# Patient Record
Sex: Female | Born: 1945
Health system: Southern US, Community
[De-identification: ages and names within clinical notes are randomized; demographics above are authoritative.]

## PROBLEM LIST (undated history)

## (undated) DIAGNOSIS — E785 Hyperlipidemia, unspecified: Secondary | ICD-10-CM

## (undated) DIAGNOSIS — M1990 Unspecified osteoarthritis, unspecified site: Secondary | ICD-10-CM

## (undated) DIAGNOSIS — H269 Unspecified cataract: Secondary | ICD-10-CM

## (undated) DIAGNOSIS — I10 Essential (primary) hypertension: Secondary | ICD-10-CM

## (undated) DIAGNOSIS — G459 Transient cerebral ischemic attack, unspecified: Secondary | ICD-10-CM

## (undated) DIAGNOSIS — I639 Cerebral infarction, unspecified: Secondary | ICD-10-CM

## (undated) DIAGNOSIS — K219 Gastro-esophageal reflux disease without esophagitis: Secondary | ICD-10-CM

## (undated) DIAGNOSIS — H409 Unspecified glaucoma: Secondary | ICD-10-CM

## (undated) HISTORY — PX: EYE SURGERY: SHX253

## (undated) HISTORY — DX: Essential (primary) hypertension: I10

## (undated) HISTORY — DX: Hyperlipidemia, unspecified: E78.5

## (undated) HISTORY — DX: Gastro-esophageal reflux disease without esophagitis: K21.9

## (undated) HISTORY — DX: Unspecified cataract: H26.9

## (undated) HISTORY — DX: Unspecified osteoarthritis, unspecified site: M19.90

## (undated) HISTORY — PX: KNEE SURGERY: SHX244

## (undated) HISTORY — DX: Cerebral infarction, unspecified: I63.9

## (undated) HISTORY — PX: TONSILLECTOMY: SUR1361

---

## 1998-02-19 ENCOUNTER — Other Ambulatory Visit: Admission: RE | Admit: 1998-02-19 | Discharge: 1998-02-19 | Payer: Self-pay | Admitting: Obstetrics and Gynecology

## 1998-03-14 ENCOUNTER — Other Ambulatory Visit: Admission: RE | Admit: 1998-03-14 | Discharge: 1998-03-14 | Payer: Self-pay | Admitting: Obstetrics and Gynecology

## 1998-05-09 ENCOUNTER — Other Ambulatory Visit: Admission: RE | Admit: 1998-05-09 | Discharge: 1998-05-09 | Payer: Self-pay | Admitting: Radiology

## 1998-09-02 ENCOUNTER — Other Ambulatory Visit: Admission: RE | Admit: 1998-09-02 | Discharge: 1998-09-02 | Payer: Self-pay | Admitting: Obstetrics and Gynecology

## 1999-03-13 ENCOUNTER — Other Ambulatory Visit: Admission: RE | Admit: 1999-03-13 | Discharge: 1999-03-13 | Payer: Self-pay | Admitting: Obstetrics and Gynecology

## 1999-05-23 ENCOUNTER — Encounter: Admission: RE | Admit: 1999-05-23 | Discharge: 1999-05-23 | Payer: Self-pay | Admitting: Emergency Medicine

## 1999-05-23 ENCOUNTER — Encounter: Payer: Self-pay | Admitting: Emergency Medicine

## 1999-06-12 ENCOUNTER — Encounter: Admission: RE | Admit: 1999-06-12 | Discharge: 1999-06-12 | Payer: Self-pay | Admitting: Emergency Medicine

## 1999-06-12 ENCOUNTER — Encounter: Payer: Self-pay | Admitting: Emergency Medicine

## 2000-03-15 ENCOUNTER — Other Ambulatory Visit: Admission: RE | Admit: 2000-03-15 | Discharge: 2000-03-15 | Payer: Self-pay | Admitting: Obstetrics and Gynecology

## 2000-08-30 ENCOUNTER — Ambulatory Visit (HOSPITAL_COMMUNITY): Admission: RE | Admit: 2000-08-30 | Discharge: 2000-08-30 | Payer: Self-pay | Admitting: Gastroenterology

## 2000-08-30 ENCOUNTER — Encounter: Payer: Self-pay | Admitting: Gastroenterology

## 2000-09-30 ENCOUNTER — Encounter: Payer: Self-pay | Admitting: General Surgery

## 2000-09-30 ENCOUNTER — Ambulatory Visit (HOSPITAL_COMMUNITY): Admission: RE | Admit: 2000-09-30 | Discharge: 2000-09-30 | Payer: Self-pay | Admitting: General Surgery

## 2000-10-18 ENCOUNTER — Encounter: Admission: RE | Admit: 2000-10-18 | Discharge: 2000-10-18 | Payer: Self-pay | Admitting: Emergency Medicine

## 2000-10-18 ENCOUNTER — Encounter: Payer: Self-pay | Admitting: Emergency Medicine

## 2001-03-21 ENCOUNTER — Other Ambulatory Visit: Admission: RE | Admit: 2001-03-21 | Discharge: 2001-03-21 | Payer: Self-pay | Admitting: Obstetrics and Gynecology

## 2001-03-29 ENCOUNTER — Encounter: Payer: Self-pay | Admitting: Emergency Medicine

## 2001-03-29 ENCOUNTER — Encounter: Admission: RE | Admit: 2001-03-29 | Discharge: 2001-03-29 | Payer: Self-pay | Admitting: Emergency Medicine

## 2002-05-25 ENCOUNTER — Other Ambulatory Visit: Admission: RE | Admit: 2002-05-25 | Discharge: 2002-05-25 | Payer: Self-pay | Admitting: Obstetrics & Gynecology

## 2003-04-09 ENCOUNTER — Encounter: Admission: RE | Admit: 2003-04-09 | Discharge: 2003-04-09 | Payer: Self-pay | Admitting: Gastroenterology

## 2003-06-08 ENCOUNTER — Other Ambulatory Visit: Admission: RE | Admit: 2003-06-08 | Discharge: 2003-06-08 | Payer: Self-pay | Admitting: Obstetrics & Gynecology

## 2003-07-02 ENCOUNTER — Encounter (INDEPENDENT_AMBULATORY_CARE_PROVIDER_SITE_OTHER): Payer: Self-pay | Admitting: *Deleted

## 2003-07-02 ENCOUNTER — Ambulatory Visit (HOSPITAL_COMMUNITY): Admission: RE | Admit: 2003-07-02 | Discharge: 2003-07-02 | Payer: Self-pay | Admitting: Gastroenterology

## 2003-07-30 ENCOUNTER — Encounter: Admission: RE | Admit: 2003-07-30 | Discharge: 2003-07-30 | Payer: Self-pay | Admitting: Gastroenterology

## 2004-01-11 ENCOUNTER — Encounter: Admission: RE | Admit: 2004-01-11 | Discharge: 2004-01-11 | Payer: Self-pay | Admitting: Emergency Medicine

## 2004-01-23 ENCOUNTER — Encounter: Admission: RE | Admit: 2004-01-23 | Discharge: 2004-01-23 | Payer: Self-pay | Admitting: Emergency Medicine

## 2004-05-29 ENCOUNTER — Encounter: Admission: RE | Admit: 2004-05-29 | Discharge: 2004-05-29 | Payer: Self-pay | Admitting: Obstetrics and Gynecology

## 2004-06-10 ENCOUNTER — Encounter (INDEPENDENT_AMBULATORY_CARE_PROVIDER_SITE_OTHER): Payer: Self-pay | Admitting: *Deleted

## 2004-06-10 ENCOUNTER — Ambulatory Visit (HOSPITAL_BASED_OUTPATIENT_CLINIC_OR_DEPARTMENT_OTHER): Admission: RE | Admit: 2004-06-10 | Discharge: 2004-06-10 | Payer: Self-pay | Admitting: General Surgery

## 2004-06-10 ENCOUNTER — Ambulatory Visit (HOSPITAL_COMMUNITY): Admission: RE | Admit: 2004-06-10 | Discharge: 2004-06-10 | Payer: Self-pay | Admitting: General Surgery

## 2004-06-30 ENCOUNTER — Ambulatory Visit (HOSPITAL_COMMUNITY): Admission: RE | Admit: 2004-06-30 | Discharge: 2004-06-30 | Payer: Self-pay | Admitting: Obstetrics and Gynecology

## 2004-07-01 ENCOUNTER — Encounter: Admission: RE | Admit: 2004-07-01 | Discharge: 2004-07-01 | Payer: Self-pay | Admitting: Emergency Medicine

## 2004-10-22 ENCOUNTER — Ambulatory Visit (HOSPITAL_COMMUNITY): Admission: RE | Admit: 2004-10-22 | Discharge: 2004-10-22 | Payer: Self-pay | Admitting: Obstetrics and Gynecology

## 2004-10-22 ENCOUNTER — Encounter (INDEPENDENT_AMBULATORY_CARE_PROVIDER_SITE_OTHER): Payer: Self-pay | Admitting: *Deleted

## 2005-04-19 ENCOUNTER — Emergency Department (HOSPITAL_COMMUNITY): Admission: EM | Admit: 2005-04-19 | Discharge: 2005-04-19 | Payer: Self-pay | Admitting: Emergency Medicine

## 2007-04-25 ENCOUNTER — Inpatient Hospital Stay (HOSPITAL_COMMUNITY): Admission: RE | Admit: 2007-04-25 | Discharge: 2007-04-27 | Payer: Self-pay | Admitting: Orthopedic Surgery

## 2010-09-09 NOTE — Op Note (Signed)
[name], [name]               ACCOUNT NO.:  [id number]   MEDICAL RECORD NO.:  [id number]          PATIENT TYPE:  INP   LOCATION:  2899                         FACILITY:  [facility]   PHYSICIAN:  Elana Alm. Thurston Hole, M.D. DATE OF BIRTH:  [birth year]   DATE OF PROCEDURE:  04/25/2007  DATE OF DISCHARGE:                               OPERATIVE REPORT   PREOPERATIVE DIAGNOSES:  Right knee degenerative joint disease with  avascular necrosis medial femoral condyle.   POSTOPERATIVE DIAGNOSES:  Right knee degenerative joint disease with  avascular necrosis medial femoral condyle.   PROCEDURE:  Right total knee replacement using DePuy cemented total knee  system with number 2.5 cemented femur, #3 cemented tibia with 12.5 mm  polyethylene RP tibial spacer and 32 mm polyethylene cemented patella.   SURGEON:  Dr. Salvatore Marvel.   ASSISTANT:  Kirstin Shepperson, PA.   ANESTHESIA:  General.   OPERATIVE TIME:  1 hour 30 minutes.   COMPLICATIONS:  None.   DESCRIPTION OF PROCEDURE:  Catherine [name] was brought to operating room on  04/25/2007 after a femoral nerve block was placed by anesthesia.  She  was placed on operative table in supine position.  She received Ancef 1  gram IV preoperatively for prophylaxis.  After being placed under  general anesthesia she had a Foley catheter placed under sterile  conditions.  Her right knee was examined.  Range of motion from minus 10  to 120 degrees with mild varus deformity and knee stable ligamentous  exam with normal patellar tracking.  The right leg was prepped using  sterile DuraPrep and draped using sterile technique.  Originally leg was  exsanguinated and a thigh tourniquet elevated 365 mm.  Initially through  a 15 cm longitudinal incision based over the patella, initial exposure  was made.  The underlying subcutaneous tissues were incised along with  skin incision.  A median arthrotomy was performed revealing an excessive  amount of normal-appearing  joint fluid.  The articular surfaces were  inspected.  She had complete grade 4 changes on the medial femoral  condyle, medial tibial plateau, grade 3 changes laterally and grade 3  and 4 changes in the patellofemoral joint.  Osteophytes removed from the  femoral condyles and tibial plateau.  The medial and lateral meniscal  remnants removed as well as anterior cruciate ligament.  An  intramedullary drill was then drilled up the femoral canal for placement  distal femoral cutting jig, which was placed in the appropriate manner  rotation and the distal 13 mm cut was made.  Distal femur was incised.  A #2.5 was found be the appropriate size and 2.5 cutting jig was placed  in appropriate manner of external rotation and then these cuts were  made.  The proximal tibia was then exposed.  Tibial spines were removed  with an oscillating saw.  Intramedullary drill was drilled down the  tibial canal for placement of proximal tibial cutting jig, which was  placed in the appropriate manner rotation and proximal 4 mm cut was made  based off the medial or lower side.  Spacer blocks were  then placed in  flexion and extension.  A 12.5 mm blocks were used and this gave  excellent stability, excellent balancing and excellent correction of her  flexion and varus deformities.  At this point the #3 tibial base plate  trial was placed on the cut tibial surface with an excellent fit and a  keel cut was made.  The PCL box cutter was then placed on the distal  femur and then these cuts were made.  At this point the #2.5 femoral  trial was placed and with the #3 tibial base plate trial and a 12.5 mm  polyethylene tibial spacer, knee was reduced, taken through range of  motion from zero to 125 degrees with excellent stability and excellent  correction of her flexion and varus deformities.  A resurfacing 8 mm cut  was then made on the patella and three locking holes placed for a 32 mm  patellar trial which was  placed.  Patellofemoral tracking was then  evaluated and found to be normal.  At this point it was felt that all  the components were of excellent size, fit and stability.  They were  then removed and the knee was then jet lavage irrigated with 3 liters of  saline.  Proximal tibia was then exposed.  A #3 tibial baseplate with  cement backing was hammered into position with an excellent fit with  excess cement being removed from around the edges.  The #2.5 femoral  component with cement backing was hammered into position also with an  excellent fit with excess cement being removed from around the edges.  The 12.5 mm polyethylene RP tibial spacer was placed on the tibial  baseplate.  The knee reduced, taken through range of motion from zero to  125 degrees with excellent stability and excellent correction of her  flexion and varus deformities.  The 32 mm polyethylene cement backed  patella was then placed in this position and held there with a clamp.  After the cement hardened again patellofemoral tracking was evaluated  and found to be normal.  At this point it was felt that all components  were of excellent size, fit and stability.  The knee was further  irrigated with saline.  The tourniquet was then released.  Hemostasis  obtained with cautery.  The arthrotomy was then closed with #1 Ethilon  suture over two medium Hemovac drains.  Subcutaneous tissues closed with  zero and 2-0 Vicryl.  Subcuticular layer closed with 4-0 Monocryl.  Sterile dressings long-leg splint applied.  The patient then awakened,  extubated, taken to recovery in stable condition.  Needle and sponge  counts correct x2 at the end of the case.  Pulses 2+ and symmetric.      Robert A. Thurston Hole, M.D.  Electronically Signed     RAW/MEDQ  D:  04/25/2007  T:  04/25/2007  Job:  [id number]

## 2010-09-12 NOTE — Op Note (Signed)
NAME:  Catherine [name], Catherine [name]               ACCOUNT NO.:  [id number]   MEDICAL RECORD NO.:  [id number]          PATIENT TYPE:  AMB   LOCATION:  SDC                           FAC[facility]  [facility]   PHYSICIAN:  Sherry A. Dickstein, M.D.DATE OF BIRTH:  05/13/1945   DATE OF PROCEDURE:  10/22/2004  DATE OF DISCHARGE:                                 OPERATIVE REPORT   PREOPERATIVE DIAGNOSIS:  Left adnexal mass.   POSTOPERATIVE DIAGNOSIS:  Left ovarian mass.   PROCEDURE:  Diagnostic laparoscopy with left salpingo-oophorectomy.   SURGEONS:  1.  Sherry A. Rosalio Macadamia, M.D.  2.  Lenoard Aden, M.D.   ANESTHESIA:  General.   INDICATIONS:  This is a 65 year old G3 P3-0-0-3 woman who has had some  intermittent left lower quadrant pain for the past six to eight months.  The  patient was initially evaluated and had a CT scan which revealed a possible  left adnexal mass.  Ultrasound was performed which showed fibroid present on  the uterus plus an adnexal mass that was solid.  It could not be determined  whether this was part of her ovary or another fibroid.  Because of this, the  patient was prepared for a diagnostic laparoscopy.  However, preoperatively  the patient had a change in her EKG that was thought to be a possible MI.  The patient was then worked up completely for an MI which was found to be  negative.  She then was brought back for preoperative workup and now is  ready for surgery.   FINDINGS:  Normal anteflexed uterus, with a  2-3 cm pedunculated fibroid off  the posterior wall of her uterus.  Her left ovary with a 4-5 cm solid mass,  consistent with a fibroma.  Normal tube on the left.  Normal right tube and  ovary, and normal upper abdominal exploration.   PROCEDURE:  The patient was brought into the operating room and given  adequate general anesthesia.  She was placed in a dorsal lithotomy position.  Her abdomen and then her vagina were washed with Betadine.  A Foley catheter  was  placed in the bladder.  Speculum was placed in the vagina. Cervix was  grasped with a single-tooth tenaculum.  Cervix was sounded.  Acorn tenaculum  was placed in the cervix.  Surgeon's gown and gloves were changed.  The  patient was draped in a sterile fashion.  Subumbilical incision was  infiltrated with 0.25% Marcaine and incision made.  The fascia was  identified.  It was grasped.  It was incised.  The fascial edges were  identified, and a pursestring stitch was taken with 0 Vicryl.  The  peritoneum was identified and entered bluntly.  A Hasson trocar was  introduced into the peritoneal cavity.  Carbon dioxide was insufflated.  The  laparoscope was introduced into the peritoneal cavity.  A left lateral  incision was made after infiltrating with 0.25% Marcaine, and a left 5 mm  trocar was introduced under direct visualization.  The pelvis was inspected,  with the above findings, and once the left ovary was seen it  was determined  that this was going to be removed.  A right lateral midline incision was  made after infiltrating with 0.25% Marcaine, and a right 5 mm trocar was  introduced under direct visualization.  The left infundibulopelvic ligament  and left uteroovarian ligaments were cauterized with tripolar cautery and  cut and excised in this fashion.  This was well above the ureters.  It was  grasped, and then it was placed in an EndoCatch bag.  A 5 mm scope was  placed so that the specimen could be placed in the EndoCatch bag through the  umbilical port.  The specimen was then removed through the umbilical port  through the bag by cutting it into pieces.  It was very firm and very hard.  It was consistent with a fibroma, and this was removed in piece until the  entire specimen and bag could be removed.  Once this was removed, the Hasson  was replaced into the abdominal cavity, and carbon dioxide was re-  insufflated.  The pelvis was inspected.  The pelvis was washed.  Pictures   were obtained.  Adequate hemostasis was present.  Once all fluid was removed  because of previous irrigation, the pelvis was washed with 0.25% Marcaine to  leave in for postoperative pain relief.  All carbon dioxide was allowed to  escape.  The 5 mm ports were removed under direct visualization.  The Hasson  was removed after all carbon dioxide had escaped.  The Vicryl stitch was  closed over a finger to prevent any bowel being caught during closure.  The  subumbilical incision was closed with 4-0 Monocryl in a running stitch.  All  incisions were then closed with Dermabond.  The tenaculum and acorn were  removed from the vagina.  The patient was taken out of the dorsal lithotomy  position.  She was awakened.  She was extubated.  She was moved from the  operating table to a stretcher in stable condition.   COMPLICATIONS:  None.   ESTIMATED BLOOD LOSS:  Less than 5 cc.   SPECIMENS:  Left tube and ovary.       SAD/MEDQ  D:  10/22/2004  T:  10/22/2004  Job:  [id number]

## 2010-09-12 NOTE — Op Note (Signed)
NAME:  Catherine [name], Catherine [name]                         ACCOUNT NO.:  [id number]   MEDICAL RECORD NO.:  [id number]                   PATIENT TYPE:  AMB   LOCATION:  ENDO                                 FACILITY:  [facility]   PHYSICIAN:  Anselmo Rod, M.D.               DATE OF BIRTH:  1945/10/05   DATE OF PROCEDURE:  07/02/2003  DATE OF DISCHARGE:                                 OPERATIVE REPORT   PROCEDURE:  Colonoscopy with snare polypectomy x1.   ENDOSCOPIST:  Anselmo Rod, M.D.   INSTRUMENT USED:  Olympus video colonoscope.   INDICATIONS FOR PROCEDURE:  A 65 year old white female underwent a screening  colonoscopy.  The patient has had occasional rare BPR and some right lower  quadrant pain.  Will rule out colonic polyps, masses, etc.   PREPROCEDURE PREPARATION:  Informed consent was procured from the patient.  The patient was fasted for eight hours prior to the procedure and prepped  with a bottle of magnesium citrate and a gallon of GoLYTELY the night prior  to the procedure.   PREPROCEDURE PHYSICAL EXAMINATION:  VITAL SIGNS:  Stable.  NECK:  Supple.  CHEST:  Clear to auscultation.  S1 and S2 regular.  ABDOMEN:  Soft with normal bowel sounds.   DESCRIPTION OF PROCEDURE:  The patient was placed in the left lateral  decubitus position and sedated with 75 mg of Demerol and 5 mg of Versed  intravenously.  Once the patient was adequately sedated and maintained on  low flow oxygen and continuous cardiac monitoring, the Olympus video  colonoscope was advanced from the rectum to the cecum.  There was severe  melanosis coli throughout the colon.  The appendiceal orifice and ileocecal  valve were visualized and photographed. The patient's position was changed  from the left lateral to the supine position to reach the cecal base.  The  terminal ileum appeared healthy and without lesions.  A small sessile polyp  was removed (by cold snare).  Small internal hemorrhoids were recognized on  retroflexion in the rectum.  There was no evidence of diverticulosis.  No  other masses or polyps were seen.  The patient tolerated the procedure well  without complications.   IMPRESSION:  1. Small nonbleeding internal hemorrhoids.  2. Severe melanosis coli throughout the colon.  3. Small polyp snared at 10 cm (cold snare).  4. Normal terminal ileum.   RECOMMENDATIONS:  1. Await pathology results.  2. Avoid laxatives, but use stool softeners as needed.  3. High fiber diet with liberal fluid intake.  4. Outpatient follow-up in the next two weeks for further recommendations.                                               Anselmo Rod, M.D.  JNM/MEDQ  D:  07/02/2003  T:  07/02/2003  Job:  [id number]   cc:   Reuben Likes, M.D.  [address]  [place]  [place] [address]  Fax: [phone]   Hayes Ludwig, N.P. [facility]

## 2010-09-12 NOTE — Op Note (Signed)
Catherine [name], HABIG               ACCOUNT NO.:  [id number]   MEDICAL RECORD NO.:  [id number]          PATIENT TYPE:  AMB   LOCATION:  SDC                           [facility]ILITY[facility]   PHYSICIAN:  Adolph Pollack, M.D.DATE OF BIRTH:  1946-04-07   DATE OF PROCEDURE:  06/10/2004  DATE OF DISCHARGE:                                 OPERATIVE REPORT   PREOPERATIVE DIAGNOSIS:  A 1 cm left arm soft tissue mass.   POSTOPERATIVE DIAGNOSIS:  A 1 cm left arm soft tissue mass.   OPERATION PERFORMED:  Excision of 1 cm left arm soft tissue mass.   SURGEON:  Adolph Pollack, M.D.   ANESTHESIA:  Lidocaine Marcaine mix.   INDICATIONS FOR PROCEDURE:  This is a 65 year old female who noticed a soft  tissue mass proximal to the medial epicondyle, left upper extremity becoming  more painful.  She now presents for excision.   DESCRIPTION OF PROCEDURE:  The patient was placed supine on the operating  table and the arm was positioned to expose the mass.  The mass was marked  with a marking pen.  The area was sterilely prepped and draped.  Local  anesthetic was infiltrated directly over the mass.  A longitudinal incision  was made over through the skin and subcutaneous tissue over the mass.  Using  blunt dissection, I was able to dissect the mass down.  It looked like it  could be lipomatous or possibly even an epitrochlear lymph node.  This  measured 1 cm and was sent to pathology.   Direct pressure was held on the wound and hemostasis was adequate.  The skin  was then closed with running 4-0 Monocryl subcuticular stitch followed by  Steri-Strips and sterile dressing.  The patient tolerated the procedure  without any apparent complications.  She will be released from the minor  procedure room with instructions and follow up with me in two weeks.      TJR/MEDQ  D:  06/10/2004  T:  06/10/2004  Job:  [id number]

## 2010-12-30 ENCOUNTER — Other Ambulatory Visit (HOSPITAL_COMMUNITY): Payer: Self-pay | Admitting: Gastroenterology

## 2010-12-30 DIAGNOSIS — R110 Nausea: Secondary | ICD-10-CM

## 2010-12-30 DIAGNOSIS — R1084 Generalized abdominal pain: Secondary | ICD-10-CM

## 2011-01-09 ENCOUNTER — Ambulatory Visit (HOSPITAL_COMMUNITY)
Admission: RE | Admit: 2011-01-09 | Discharge: 2011-01-09 | Disposition: A | Discharge status: Home / Self Care | Payer: BC Managed Care – PPO | Source: Ambulatory Visit | Attending: Gastroenterology | Admitting: Gastroenterology

## 2011-01-09 DIAGNOSIS — R110 Nausea: Secondary | ICD-10-CM | POA: Insufficient documentation

## 2011-01-09 DIAGNOSIS — R109 Unspecified abdominal pain: Secondary | ICD-10-CM | POA: Insufficient documentation

## 2011-01-09 DIAGNOSIS — R1084 Generalized abdominal pain: Secondary | ICD-10-CM

## 2011-01-09 MED ORDER — TECHNETIUM TC 99M MEBROFENIN IV KIT
5.0000 | PACK | Freq: Once | INTRAVENOUS | Status: AC | PRN
  Administered 2011-01-09: 5 via INTRAVENOUS

## 2011-01-30 LAB — CBC
HCT: 29.1 — ABNORMAL LOW
HCT: 38.4
Hemoglobin: 10 — ABNORMAL LOW
MCHC: 34
MCV: 87.4
MCV: 87.5
Platelets: 227
Platelets: 242
Platelets: 375
RBC: 3.21 — ABNORMAL LOW
RBC: 3.35 — ABNORMAL LOW
RDW: 13.6
WBC: 6.3
WBC: 7.4

## 2011-01-30 LAB — BASIC METABOLIC PANEL
CO2: 29
CO2: 30
Calcium: 7.9 — ABNORMAL LOW
Creatinine, Ser: 0.6
GFR calc Af Amer: >60
GFR calc non Af Amer: >60
GFR calc non Af Amer: >60
Potassium: 3.7
Sodium: 135

## 2011-01-30 LAB — DIFFERENTIAL
Basophils Relative: 0
Eosinophils Relative: 1
Lymphocytes Relative: 31
Lymphs Abs: 1.9
Monocytes Relative: 8
Neutrophils Relative %: 60

## 2011-01-30 LAB — COMPREHENSIVE METABOLIC PANEL
Albumin: 4
Alkaline Phosphatase: 84
CO2: 31
Calcium: 9.7
Chloride: 101
Creatinine, Ser: 0.72
GFR calc Af Amer: >60
Potassium: 4.1

## 2011-01-30 LAB — URINE CULTURE: Culture: NO GROWTH

## 2011-01-30 LAB — URINALYSIS, ROUTINE W REFLEX MICROSCOPIC
Bilirubin Urine: NEGATIVE
Ketones, ur: NEGATIVE
Urobilinogen, UA: 0.2

## 2011-01-30 LAB — PROTIME-INR
INR: 1.2
INR: 3 — ABNORMAL HIGH
INR: 3.5 — ABNORMAL HIGH
Prothrombin Time: 15
Prothrombin Time: 32.3 — ABNORMAL HIGH
Prothrombin Time: 36.7 — ABNORMAL HIGH

## 2011-01-30 LAB — APTT: aPTT: 32

## 2011-01-30 LAB — TYPE AND SCREEN: Antibody Screen: NEGATIVE

## 2012-08-16 ENCOUNTER — Ambulatory Visit (INDEPENDENT_AMBULATORY_CARE_PROVIDER_SITE_OTHER): Payer: BC Managed Care – PPO | Admitting: Family Medicine

## 2012-08-16 ENCOUNTER — Ambulatory Visit (HOSPITAL_COMMUNITY)
Admission: RE | Admit: 2012-08-16 | Discharge: 2012-08-16 | Disposition: A | Discharge status: Home / Self Care | Payer: BC Managed Care – PPO | Source: Ambulatory Visit | Attending: Family Medicine | Admitting: Family Medicine

## 2012-08-16 VITALS — BP 120/76 | HR 69 | Temp 98.0°F | Resp 16 | Ht 63.0 in | Wt 173.4 lb

## 2012-08-16 DIAGNOSIS — R112 Nausea with vomiting, unspecified: Secondary | ICD-10-CM

## 2012-08-16 DIAGNOSIS — R270 Ataxia, unspecified: Secondary | ICD-10-CM

## 2012-08-16 DIAGNOSIS — R5381 Other malaise: Secondary | ICD-10-CM | POA: Insufficient documentation

## 2012-08-16 DIAGNOSIS — R5383 Other fatigue: Secondary | ICD-10-CM | POA: Insufficient documentation

## 2012-08-16 DIAGNOSIS — R51 Headache: Secondary | ICD-10-CM | POA: Insufficient documentation

## 2012-08-16 DIAGNOSIS — R279 Unspecified lack of coordination: Secondary | ICD-10-CM

## 2012-08-16 DIAGNOSIS — R531 Weakness: Secondary | ICD-10-CM

## 2012-08-16 LAB — POCT UA - MICROSCOPIC ONLY
Bacteria, U Microscopic: NEGATIVE
Crystals, Ur, HPF, POC: NEGATIVE

## 2012-08-16 LAB — COMPREHENSIVE METABOLIC PANEL
ALT: 13 U/L (ref 0–35)
CO2: 30 mEq/L (ref 19–32)
Calcium: 10.2 mg/dL (ref 8.4–10.5)
Chloride: 98 mEq/L (ref 96–112)
Creat: 0.58 mg/dL (ref 0.50–1.10)
Glucose, Bld: 101 mg/dL — ABNORMAL HIGH (ref 70–99)
Sodium: 137 mEq/L (ref 135–145)
Total Bilirubin: 0.8 mg/dL (ref 0.3–1.2)
Total Protein: 7.6 g/dL (ref 6.0–8.3)

## 2012-08-16 LAB — POCT URINALYSIS DIPSTICK
Bilirubin, UA: NEGATIVE
Ketones, UA: NEGATIVE
Protein, UA: NEGATIVE
Spec Grav, UA: 1.015
pH, UA: 7

## 2012-08-16 LAB — VITAMIN B12: Vitamin B-12: 528 pg/mL (ref 211–911)

## 2012-08-16 LAB — POCT CBC
Granulocyte percent: 84.9 %G — AB (ref 37–80)
HCT, POC: 41.3 % (ref 37.7–47.9)
Hemoglobin: 13 g/dL (ref 12.2–16.2)
MCH, POC: 28.3 pg (ref 27–31.2)
MCV: 90 fL (ref 80–97)
RBC: 4.59 M/uL (ref 4.04–5.48)
WBC: 8.9 10*3/uL (ref 4.6–10.2)

## 2012-08-16 LAB — TSH: TSH: 0.993 u[IU]/mL (ref 0.350–4.500)

## 2012-08-16 MED ORDER — PROMETHAZINE HCL 12.5 MG PO TABS
25.0000 mg | ORAL_TABLET | Freq: Once | ORAL | Status: AC
  Administered 2012-08-16: 25 mg via ORAL

## 2012-08-16 MED ORDER — PROMETHAZINE HCL 25 MG PO TABS
25.0000 mg | ORAL_TABLET | Freq: Three times a day (TID) | ORAL | Status: DC | PRN
Start: 2012-08-16 — End: 2013-02-15

## 2012-08-16 MED ORDER — BUTALBITAL-APAP-CAFF-COD 50-325-40-30 MG PO CAPS: 1.0000 | ORAL_CAPSULE | ORAL | Status: DC | PRN

## 2012-08-16 NOTE — Progress Notes (Signed)
Subjective:    [name], female    DOB: 1946-03-15, 67 y.o.   MRN: [id number] Chief Complaint  Patient presents with  . Headache    started this morning   HPI  Catherine [name] is a 67 yo woman with no sig PMHx who was in her normal state of health this morning.  She was cleaning up the kitchen when she had very sudden onset of sharp stabbing pains starting in bilateral posterior neck/occiput and radiating around head to bilateral temples when she suddenly strained up from bending over.  The sharp stabbing pain only lasted for a few seconds but since then she has continued to have a severe bitemporal HA, and feels very weak, disoriented, and nauseated. About 2 hrs after heaving the headache she was still feeing like she was going to pass out and did vomit due to headache so she called her husband at work to come home and get her.  Her husband reports that this is very unusual but that her voice sounds more weak and faint - but not slurred or trouble word-finding.  She still feels "shakey all over" but has been completely coherent and alert the entire time, but just not like herself.  Currently has had the headache for around 8 hrs and not improving or resolving at all. Feels imbalanced and unsteady when walking - like she might fall.  Has not vomitied in 4 hrs now.  HA is worse w/ light and sound. No h/o migraines.  No sinus congestion or allergies.  Took a generic excedrin migraine this morning w/o relief. Never had anything like this prior. All sxs have been throughout whole body - nothing is one sided.  She is not on any medications. Does take a daily mvi and just started a b12 supp yest as she has been feeling much more weak and fatigued over the past sev d.   Past Medical History  Diagnosis Date  . Arthritis   . GERD (gastroesophageal reflux disease)   . Cataract    Review of Systems  Constitutional: Positive for activity change, appetite change and fatigue. Negative for fever,  chills, diaphoresis and unexpected weight change.  HENT: Negative for ear pain, congestion, rhinorrhea, neck pain, neck stiffness, dental problem, sinus pressure and tinnitus.   Eyes: Positive for photophobia. Negative for pain, discharge and visual disturbance.  Respiratory: Negative for cough and shortness of breath.   Cardiovascular: Negative for chest pain and palpitations.  Gastrointestinal: Positive for nausea and vomiting. Negative for abdominal pain.  Skin: Negative for rash.  Neurological: Positive for weakness, light-headedness and headaches. Negative for dizziness, tremors, seizures, syncope, facial asymmetry, speech difficulty and numbness.  Psychiatric/Behavioral: Negative for sleep disturbance.      BP 120/76  Pulse 69  Temp(Src) 98 F (36.7 C) (Oral)  Resp 16  Ht 5\' 3"  (1.6 m)  Wt 173 lb 6.4 oz (78.654 kg)  BMI 30.72 kg/m2  SpO2 98% Objective:   Physical Exam  Constitutional: She is oriented to person, place, and time. She appears well-developed and well-nourished. She appears lethargic.  Laying on exam table in dark room, all lights off.  HENT:  Head: Normocephalic and atraumatic.  Right Ear: External ear and ear canal normal. Tympanic membrane is injected. A middle ear effusion is present.  Left Ear: External ear and ear canal normal. Tympanic membrane is injected. A middle ear effusion is present.  Nose: Nose normal. No mucosal edema or rhinorrhea. Right sinus exhibits no maxillary  sinus tenderness. Left sinus exhibits no maxillary sinus tenderness.  Mouth/Throat: Uvula is midline, oropharynx is clear and moist and mucous membranes are normal. No oropharyngeal exudate.  Normal fundoscopic exam  Eyes: Conjunctivae, EOM and lids are normal. Pupils are equal, round, and reactive to light.  Neck: Trachea normal. Neck supple. Muscular tenderness present. No spinous process tenderness present. No rigidity. Decreased range of motion present. No edema and no erythema  present. No mass and no thyromegaly present.  Cardiovascular: Normal rate, regular rhythm, normal heart sounds and intact distal pulses.   Pulmonary/Chest: Effort normal and breath sounds normal. No respiratory distress.  Neurological: She is oriented to person, place, and time. She has normal strength. She appears lethargic. She is not disoriented. She displays no tremor. No cranial nerve deficit or sensory deficit. She exhibits normal muscle tone. She displays a negative Romberg sign. Coordination and gait abnormal.  Reflex Scores:      Bicep reflexes are 2+ on the right side and 2+ on the left side.      Brachioradialis reflexes are 2+ on the right side and 2+ on the left side. Slight delay and marked confusion bilaterally with finger-to-nose and rapid alternating movements. Gait slowed and unsteady.  Skin: Skin is warm and dry. She is not diaphoretic.  Psychiatric: She has a normal mood and affect. Her behavior is normal.      [facility] reading (PRIMARY) by  Dr. Clelia Croft. EKG: NSR, no ischemic changes  Results for orders placed in visit on 08/16/12  POCT CBC      Result Value Range   WBC 8.9  4.6 - 10.2 K/uL   Lymph, poc 0.9  0.6 - 3.4   POC LYMPH PERCENT 10.6  10 - 50 %L   MID (cbc) 0.4  0 - 0.9   POC MID % 4.5  0 - 12 %M   POC Granulocyte 7.6 (*) 2 - 6.9   Granulocyte percent 84.9 (*) 37 - 80 %G   RBC 4.59  4.04 - 5.48 M/uL   Hemoglobin 13.0  12.2 - 16.2 g/dL   HCT, POC 16.1  09.6 - 47.9 %   MCV 90.0  80 - 97 fL   MCH, POC 28.3  27 - 31.2 pg   MCHC 31.5 (*) 31.8 - 35.4 g/dL   RDW, POC 04.5     Platelet Count, POC 344  142 - 424 K/uL   MPV 8.9  0 - 99.8 fL  POCT UA - MICROSCOPIC ONLY      Result Value Range   WBC, Ur, HPF, POC 0-1     RBC, urine, microscopic 0-4     Bacteria, U Microscopic neg     Mucus, UA neg     Epithelial cells, urine per micros 0-3     Crystals, Ur, HPF, POC neg     Casts, Ur, LPF, POC neg     Yeast, UA neg    POCT URINALYSIS DIPSTICK      Result Value  Range   Color, UA yellow     Clarity, UA clear     Glucose, UA neg     Bilirubin, UA neg     Ketones, UA neg     Spec Grav, UA 1.015     Blood, UA trace-intact     pH, UA 7.0     Protein, UA neg     Urobilinogen, UA 0.2     Nitrite, UA neg     Leukocytes, UA Trace  Assessment & Plan:  New onset headache - concern for thunderclap headache - "worst headache of my life" in 67 yo woman w/ no prior headache history - Check labs - send out crp, tsh, cmp, b12, esr - and send pt to [facility] for stat non-contrast head CT now.  Given phenergan 25mg  po x 1 in office to help w/ nausea and headache, start pushing fluids.  Called w/ CT results - normal.  Will treat for migraine headache w/ fioricet and promethazine. F/u in clinic tomorrow for further eval unless completely headache free. hsuband's cell name Catherine [name] - [phone] is his cell

## 2012-09-14 ENCOUNTER — Ambulatory Visit (INDEPENDENT_AMBULATORY_CARE_PROVIDER_SITE_OTHER): Payer: BC Managed Care – PPO | Admitting: Family Medicine

## 2012-09-14 VITALS — BP 137/76 | HR 69 | Temp 98.3°F | Resp 18 | Ht 63.0 in | Wt 169.0 lb

## 2012-09-14 DIAGNOSIS — T148XXA Other injury of unspecified body region, initial encounter: Secondary | ICD-10-CM

## 2012-09-14 MED ORDER — AMOXICILLIN-POT CLAVULANATE 875-125 MG PO TABS: 1.0000 | ORAL_TABLET | Freq: Two times a day (BID) | ORAL | Status: DC

## 2012-09-14 MED ORDER — CEFTRIAXONE SODIUM 1 G IJ SOLR
1.0000 g | Freq: Once | INTRAMUSCULAR | Status: AC
  Administered 2012-09-14: 1 g via INTRAMUSCULAR

## 2012-09-14 NOTE — Patient Instructions (Signed)
Very nice to meet you Put that goat in time out.  The glue should hold for 3 days. It will dissolve on its own.  I am also giving you an antibiotic.  Take daily for 10 days.  Come back if any redness, fever, chills, or discharge comes from area.

## 2012-09-14 NOTE — Progress Notes (Signed)
Patient is a very pleasant 67 year old female who is unfortunately headbutting by a goat in the lower right extremity today approximately 3 hours ago. This goat has been a pet for the last year. All immunizations are up-to-date. Patient did have a hole in the leg that did bleed quite considerably for some time.  Patient able to walk, no sign of infection, been icing it with some improvement.  No radiation of pain, no numbness.   Past Medical History  Diagnosis Date  . Arthritis   . GERD (gastroesophageal reflux disease)   . Cataract    Past Surgical History  Procedure Laterality Date  . Eye surgery     History   Social History  . Marital Status: Married    Spouse Name: N/A    Number of Children: N/A  . Years of Education: N/A   Social History Main Topics  . Smoking status: Never Smoker   . Smokeless tobacco: None  . Alcohol Use: None  . Drug Use: None  . Sexually Active: None   Other Topics Concern  . None   Social History Narrative  . None   Family History  Problem Relation Age of Onset  . Dementia Mother   . Cancer Mother   . Cancer Father     Physical exam Blood pressure 137/76, pulse 69, temperature 98.3 F (36.8 C), temperature source Oral, resp. rate 18, height 5\' 3"  (1.6 m), weight 169 lb (76.658 kg), SpO2 99.00%. General: No apparent distress alert and oriented x3 mood and affect normal Respiratory: Patient's speak in full sentences and does not appear short of breath Skin: Warm dry intact with no signs of infection or rash Right lower extremity does have puncture wound less then 1/3 inch in diameter, goes to subQ, no muscle involvement. This was debrided and hydrogen peroxide was used. No dirt, no foreign body noticed. Surrounding skin redness from icing. Skin is non-warm. She is neurovascularly intact distally.  Neuro: Cranial nerves II through XII are intact, neurovascularly intact in all extremities with 2+ DTRs and 2+ pulses.   Assessment  Puncture  wound from goat. Plan: Given a shot of ceftriaxone today and we'll do Augmentin twice daily for 10 days. Patient did have area glued it was covered with Ace wrap. Gait patient as well as husband and red flags and went to seek medical attention. Otherwise his lungs patient is doing well no followup will be necessary.

## 2012-12-14 ENCOUNTER — Observation Stay (HOSPITAL_COMMUNITY)
Admission: EM | Admit: 2012-12-14 | Discharge: 2012-12-16 | Disposition: A | Discharge status: Home / Self Care | Payer: BC Managed Care – PPO | Attending: Family Medicine | Admitting: Family Medicine

## 2012-12-14 ENCOUNTER — Encounter (HOSPITAL_COMMUNITY): Payer: Self-pay | Admitting: *Deleted

## 2012-12-14 ENCOUNTER — Emergency Department (HOSPITAL_COMMUNITY): Payer: BC Managed Care – PPO

## 2012-12-14 DIAGNOSIS — R51 Headache: Secondary | ICD-10-CM | POA: Insufficient documentation

## 2012-12-14 DIAGNOSIS — R471 Dysarthria and anarthria: Secondary | ICD-10-CM | POA: Insufficient documentation

## 2012-12-14 DIAGNOSIS — R42 Dizziness and giddiness: Secondary | ICD-10-CM | POA: Insufficient documentation

## 2012-12-14 DIAGNOSIS — I658 Occlusion and stenosis of other precerebral arteries: Secondary | ICD-10-CM | POA: Insufficient documentation

## 2012-12-14 DIAGNOSIS — R404 Transient alteration of awareness: Secondary | ICD-10-CM | POA: Diagnosis not present

## 2012-12-14 DIAGNOSIS — I10 Essential (primary) hypertension: Secondary | ICD-10-CM | POA: Diagnosis present

## 2012-12-14 DIAGNOSIS — Z79899 Other long term (current) drug therapy: Secondary | ICD-10-CM | POA: Insufficient documentation

## 2012-12-14 DIAGNOSIS — I6529 Occlusion and stenosis of unspecified carotid artery: Secondary | ICD-10-CM | POA: Insufficient documentation

## 2012-12-14 DIAGNOSIS — E785 Hyperlipidemia, unspecified: Secondary | ICD-10-CM | POA: Diagnosis present

## 2012-12-14 DIAGNOSIS — G459 Transient cerebral ischemic attack, unspecified: Principal | ICD-10-CM | POA: Diagnosis present

## 2012-12-14 LAB — POCT I-STAT, CHEM 8
Glucose, Bld: 125 mg/dL — ABNORMAL HIGH (ref 70–99)
HCT: 41 % (ref 36.0–46.0)
Hemoglobin: 13.9 g/dL (ref 12.0–15.0)
Potassium: 4.2 mEq/L (ref 3.5–5.1)

## 2012-12-14 LAB — URINALYSIS, ROUTINE W REFLEX MICROSCOPIC
Bilirubin Urine: NEGATIVE
Nitrite: NEGATIVE
Specific Gravity, Urine: 1.007 (ref 1.005–1.030)
Urobilinogen, UA: 0.2 mg/dL (ref 0.0–1.0)
pH: 8 (ref 5.0–8.0)

## 2012-12-14 LAB — DIFFERENTIAL
Basophils Absolute: 0 10*3/uL (ref 0.0–0.1)
Eosinophils Relative: 1 % (ref 0–5)
Lymphocytes Relative: 26 % (ref 12–46)
Lymphs Abs: 1.7 10*3/uL (ref 0.7–4.0)
Neutro Abs: 4.2 10*3/uL (ref 1.7–7.7)

## 2012-12-14 LAB — RAPID URINE DRUG SCREEN, HOSP PERFORMED
Barbiturates: NOT DETECTED
Cocaine: NOT DETECTED
Tetrahydrocannabinol: NOT DETECTED

## 2012-12-14 LAB — COMPREHENSIVE METABOLIC PANEL
ALT: 15 U/L (ref 0–35)
AST: 21 U/L (ref 0–37)
Alkaline Phosphatase: 86 U/L (ref 39–117)
CO2: 29 mEq/L (ref 19–32)
Chloride: 103 mEq/L (ref 96–112)
GFR calc Af Amer: >90 mL/min (ref >90)
GFR calc non Af Amer: 87 mL/min — ABNORMAL LOW (ref >90)
Glucose, Bld: 126 mg/dL — ABNORMAL HIGH (ref 70–99)
Sodium: 140 mEq/L (ref 135–145)
Total Bilirubin: 0.7 mg/dL (ref 0.3–1.2)

## 2012-12-14 LAB — CBC
HCT: 39.4 % (ref 36.0–46.0)
MCV: 87.2 fL (ref 78.0–100.0)
Platelets: 280 10*3/uL (ref 150–400)
RBC: 4.52 MIL/uL (ref 3.87–5.11)
RDW: 13.8 % (ref 11.5–15.5)
WBC: 6.4 10*3/uL (ref 4.0–10.5)

## 2012-12-14 LAB — POCT I-STAT TROPONIN I: Troponin i, poc: 0 ng/mL (ref 0.00–0.08)

## 2012-12-14 LAB — URINE MICROSCOPIC-ADD ON

## 2012-12-14 MED ORDER — HEPARIN SODIUM (PORCINE) 5000 UNIT/ML IJ SOLN
5000.0000 [IU] | Freq: Three times a day (TID) | INTRAMUSCULAR | Status: DC
  Administered 2012-12-14 – 2012-12-16 (×5): 5000 [IU] via SUBCUTANEOUS
  Filled 2012-12-14 (×8): qty 1

## 2012-12-14 MED ORDER — SODIUM CHLORIDE 0.9 % IV SOLN
INTRAVENOUS | Status: DC
  Administered 2012-12-14 – 2012-12-16 (×2): via INTRAVENOUS

## 2012-12-14 MED ORDER — ACETAMINOPHEN 325 MG PO TABS: 650.0000 mg | ORAL_TABLET | Freq: Four times a day (QID) | ORAL | Status: DC | PRN

## 2012-12-14 MED ORDER — ATORVASTATIN CALCIUM 40 MG PO TABS
40.0000 mg | ORAL_TABLET | Freq: Every day | ORAL | Status: DC
  Administered 2012-12-15 (×2): 40 mg via ORAL
  Filled 2012-12-14 (×4): qty 1

## 2012-12-14 MED ORDER — ATORVASTATIN CALCIUM 40 MG PO TABS: 40.0000 mg | ORAL_TABLET | Freq: Every day | ORAL | Status: DC

## 2012-12-14 MED ORDER — ASPIRIN EC 81 MG PO TBEC
81.0000 mg | DELAYED_RELEASE_TABLET | Freq: Every day | ORAL | Status: DC
  Administered 2012-12-14 – 2012-12-16 (×3): 81 mg via ORAL
  Filled 2012-12-14 (×4): qty 1

## 2012-12-14 NOTE — ED Provider Notes (Signed)
CSN: [id number]     Arrival date & time 12/14/12  1536 History     First MD Initiated Contact with Patient 12/14/12 1538     Chief Complaint  Patient presents with  . Code Stroke    (Consider location/radiation/quality/duration/timing/severity/associated sxs/prior Treatment) HPI Comments: Atrial female with a history of arthritis, history of cataract and acid reflux disease who presents to the hospital with several complaints including dizziness, lightheadedness, slurred speech, headache. This occurred while she was at her dentist office this morning getting her teeth cleaned. She states that she had difficult time ambulating but was able to get into her car and drive all the way home. Eventually she and her family decided to call the paramedics because of her ongoing symptoms. The paramedics reported that she had slurred speech and was unable to say [place]  On arrival the patient was able to say this without any difficulty and states that her symptoms are much improved. Her symptoms onset was 9:30 this morning, code stroke was called prior to arrival by paramedic  The history is provided by the patient.    Past Medical History  Diagnosis Date  . Arthritis   . GERD (gastroesophageal reflux disease)   . Cataract    Past Surgical History  Procedure Laterality Date  . Eye surgery     Family History  Problem Relation Age of Onset  . Dementia Mother   . Cancer Mother   . Cancer Father    History  Substance Use Topics  . Smoking status: Never Smoker   . Smokeless tobacco: Not on file  . Alcohol Use: Not on file   OB History   Grav Para Term Preterm Abortions TAB SAB Ect Mult Living                 Review of Systems  All other systems reviewed and are negative.    Allergies  Review of patient's allergies indicates no known allergies.  Home Medications   Current Outpatient Rx  Name  Route  Sig  Dispense  Refill  . ALOE VERA PO    Oral   Take 1 tablet by mouth 2 (two) times daily.         . B Complex-C (B-COMPLEX WITH VITAMIN C) tablet   Oral   Take 1 tablet by mouth daily.         Marland Kitchen BIOTIN PO   Oral   Take 1 tablet by mouth daily.         . butalbital-acetaminophen-caffeine (FIORICET/CODEINE) 50-325-40-30 MG per capsule   Oral   Take 1 capsule by mouth every 4 (four) hours as needed for headache.   15 capsule   0   . calcium carbonate (OS-CAL) 600 MG TABS   Oral   Take 600 mg by mouth 2 (two) times daily with a meal.         . CINNAMON PO   Oral   Take 1 tablet by mouth daily.         . clindamycin (CLEOCIN) 150 MG capsule   Oral   Take 600 mg by mouth See admin instructions. Takes prior to dental appt         . Ginkgo Biloba (GINKGO PO)   Oral   Take 1 tablet by mouth daily.         . promethazine (PHENERGAN) 25 MG tablet   Oral   Take 1 tablet (25 mg total) by  mouth every 8 (eight) hours as needed for nausea (headache).   10 tablet   0   . Red Yeast Rice Extract (RED YEAST RICE PO)   Oral   Take 1 tablet by mouth daily.          BP 176/74  Pulse 61  Temp(Src) 98.3 F (36.8 C) (Oral)  Resp 16  Wt 169 lb (76.658 kg)  BMI 29.94 kg/m2  SpO2 98% Physical Exam  Nursing note and vitals reviewed. Constitutional: She appears well-developed and well-nourished. No distress.  HENT:  Head: Normocephalic and atraumatic.  Mouth/Throat: Oropharynx is clear and moist. No oropharyngeal exudate.  Eyes: Conjunctivae and EOM are normal. Pupils are equal, round, and reactive to light. Right eye exhibits no discharge. Left eye exhibits no discharge. No scleral icterus.  Neck: Normal range of motion. Neck supple. No JVD present. No thyromegaly present.  Cardiovascular: Normal rate, regular rhythm, normal heart sounds and intact distal pulses.  Exam reveals no gallop and no friction rub.   No murmur heard. No JVD, no carotid bruit  Pulmonary/Chest: Effort normal and breath sounds  normal. No respiratory distress. She has no wheezes. She has no rales.  Abdominal: Soft. Bowel sounds are normal. She exhibits no distension and no mass. There is no tenderness.  Musculoskeletal: Normal range of motion. She exhibits no edema and no tenderness.  Lymphadenopathy:    She has no cervical adenopathy.  Neurological: She is alert. Coordination normal.  Normal strength and sensation of all 4 extremities, normal speech, cranial nerves III through XII intact, coordination intact  Skin: Skin is warm and dry. No rash noted. No erythema.  Psychiatric: She has a normal mood and affect. Her behavior is normal.    ED Course   Procedures (including critical care time)  Labs Reviewed  COMPREHENSIVE METABOLIC PANEL - Abnormal; Notable for the following:    Glucose, Bld 126 (*)    GFR calc non Af Amer 87 (*)    All other components within normal limits  URINALYSIS, ROUTINE W REFLEX MICROSCOPIC - Abnormal; Notable for the following:    Leukocytes, UA TRACE (*)    All other components within normal limits  URINE MICROSCOPIC-ADD ON - Abnormal; Notable for the following:    Squamous Epithelial / LPF FEW (*)    All other components within normal limits  POCT I-STAT, CHEM 8 - Abnormal; Notable for the following:    Glucose, Bld 125 (*)    All other components within normal limits  ETHANOL  PROTIME-INR  APTT  CBC  DIFFERENTIAL  TROPONIN I  URINE RAPID DRUG SCREEN (HOSP PERFORMED)  POCT I-STAT TROPONIN I   Ct Head Wo Contrast  12/14/2012   CLINICAL DATA:  67 year old female headache, dizziness. Slurred speech.  EXAM: CT HEAD WITHOUT CONTRAST  TECHNIQUE: Contiguous axial images were obtained from the base of the skull through the vertex without intravenous contrast.  COMPARISON:  08/16/2012.  FINDINGS: Visualized paranasal sinuses and mastoids are clear. Postoperative changes to the globes. Visualized scalp soft tissues are within normal limits. No acute osseous abnormality identified.   Cerebral volume is within normal limits for age. No midline shift, ventriculomegaly, mass effect, evidence of mass lesion, intracranial hemorrhage or evidence of cortically based acute infarction. Gray-white matter differentiation is within normal limits throughout the brain. No suspicious intracranial vascular hyperdensity.  IMPRESSION: Stable and normal for age non contrast CT appearance of the brain.  Study discussed by telephone with Dr. Noel Christmas on 12/14/2012 at 1549  hr.   Electronically Signed   By: Augusto Gamble   On: 12/14/2012 15:58   1. TIA (transient ischemic attack)     MDM  The patient has been seen by Dr. Roseanne Reno with neurology, he agrees that the patient may have had a TIA though her symptoms were somewhat vague and recommends inpatient admission with a TIA workup. The patient appears hemodynamically stable, CT scan.  Labs and CT scan unremarkable, discussed with neurology who recommends admitting the patient for TIA workup.  Discussed with family practice resident who will admit the patient. The patient remained hemodynamically stable with minimal symptoms. She does have hypertension but isn't gradually improving  Vida Roller, MD 12/14/12 [phone]

## 2012-12-14 NOTE — H&P (Signed)
Family Medicine Teaching [facility] Admission History and Physical Service Pager: [phone]  Patient name: Catherine [name] Medical record number: [id number] Date of birth: 1945-05-21 Age: 67 y.o. Gender: female  Primary Care Provider: Delanna Notice of [name] Consultants: Catherine [name] Code Status: FULL  Chief Complaint: dizziness/dysarthria  Assessment and Plan: Catherine [name] is a 67 y.o. year old female presenting with a probable TIA. PMH is significant for hyperlipidemia, rt knee replacement, arthritis, GERD, and cataract.  No reported history diabetes, hypertension, smoking  #dizziness/dysarthria - likely TIA given report of neurologic symptoms which had resolved by the time of admission.  Migraine is also a possibility though would not expect slurring of speech.  - neuro consulted, appreciate rec's  - MRI, MRA w/o contrast to rule out clot or ischemia   - 2-d echo  - carotid dopplers to look for plaques   - start aspirin 81mg  (did not take in past week and only intermittently took in past)   - continue telemetry   - Risk stratification  -start lipitor 40mg , pending lipids but would continue regardless   -reports muscle weakness on crestor  -f/u a1c.     - i-stat  troponins non-elevated   [ ]  hypertension- f/u blood pressure, will allow permissive hypertension for now  FEN/GI: NPO, saline lock--> Patient passed swallow screen and now on general diet.  Prophylaxis: subq heparin  Disposition: observation  History of Present Illness: [name] is a 67 y.o. year old female presenting with dizziness.  Patient was previously-healthy and at the dentist this morning for a routine cleaning when she stood up afterward and started to feel "wobbly."  No anesthesia was given. Patient took 150mg  clindamycin beforehand which is typical for her. She called her husband and he noted she was slurring her words on the phone.  She was able to make it home and fell asleep  in a chair for an unknown period in a time.  She woke up, fell asleep and then woke up again with no change in her symptoms. She denies facial droop, chest pain, shortness of breath, fever, flashing lights, or recent changes with urination or defecation.  Her grandson made her a sandwich, which did not seem to improve her symptoms.  He noted she was still slurring her speech.  She then came to the emergency room and by that evening her symptoms had mostly resolved (except for a small headache).    Pt has no significant past medical history except for a total right knee replacement and reports of arthritis, GERD and a cataract.  She takes multivitamins and supplements and does not tend to like medications.  She does occasionally take an aspirin, however, she reports she has not had one in the past week.  She reports a family history of TIAs and dementia in her mother.  Review Of Systems: Per HPI.  Otherwise 12 point review of systems was performed and was unremarkable.  Patient Active Problem List   Diagnosis Date Noted  . TIA (transient ischemic attack) 12/14/2012  . Essential hypertension, benign 12/14/2012  . Other and unspecified hyperlipidemia 12/14/2012   Past Medical History: Past Medical History  Diagnosis Date  . Arthritis   . GERD (gastroesophageal reflux disease)   . Cataract    Past Surgical History: Past Surgical History  Procedure Laterality Date  . Eye surgery    R total knee replacement  Social History: History  Substance Use Topics  . Smoking status: Never Smoker   .  Smokeless tobacco: Not on file  . Alcohol Use: Not on file  Lives with husband, daughter, and grandchild. [place]. Indendent of all ADLs and drives.   Please also refer to relevant sections of EMR.  Family History: Family History  Problem Relation Age of Onset  . Dementia Mother   . Cancer Mother   . Cancer Father   TIA-mother  Allergies and Medications: No Known  Allergies No current facility-administered medications on file prior to encounter.   Current Outpatient Prescriptions on File Prior to Encounter  Medication Sig Dispense Refill  . B Complex-C (B-COMPLEX WITH VITAMIN C) tablet Take 1 tablet by mouth daily.      . calcium carbonate (OS-CAL) 600 MG TABS Take 600 mg by mouth 2 (two) times daily with a meal.      . promethazine (PHENERGAN) 25 MG tablet Take 1 tablet (25 mg total) by mouth every 8 (eight) hours as needed for nausea (headache).  10 tablet  0    Objective: BP 160/72  Pulse 66  Temp(Src) 98.3 F (36.8 C) (Oral)  Resp 15  Wt 169 lb (76.658 kg)  BMI 29.94 kg/m2  SpO2 99% General: alert, cooperative and no distress  HEENT: PERRLA, extra ocular movement intact, neck supple with midline trachea and thyroid without masses.  Oropharynx clear and normal.  Heart: S1, S2 normal, no murmur, rub or gallop, regular rate and rhythm  Lungs: clear to auscultation, no wheezes or rales and unlabored breathing  Abdomen: abdomen is soft without significant tenderness, masses, organomegaly or guarding  Extremities: extremities normal, atraumatic, no cyanosis or edema, specifically no calf pain, swelling or tenderness.  Skin:no rashes, no ecchymoses  Catherine [name]: normal without focal findings, mental status, speech normal, alert and oriented x3   Labs and Imaging: CBC BMET   Recent Labs Lab 12/14/12 1552 12/14/12 1603  WBC 6.4  --   HGB 13.6 13.9  HCT 39.4 41.0  PLT 280  --     Recent Labs Lab 12/14/12 1552 12/14/12 1603  NA 140 141  K 4.4 4.2  CL 103 104  CO2 29  --   BUN 14 15  CREATININE 0.69 0.90  GLUCOSE 126* 125*  CALCIUM 10.2  --      8/20 - CT: FINDINGS:  Visualized paranasal sinuses and mastoids are clear. Postoperative  changes to the globes. Visualized scalp soft tissues are within  normal limits. No acute osseous abnormality identified.  Cerebral volume is within normal limits for age. No midline shift,   ventriculomegaly, mass effect, evidence of mass lesion, intracranial  hemorrhage or evidence of cortically based acute infarction.  Gray-white matter differentiation is within normal limits throughout  the brain. No suspicious intracranial vascular hyperdensity.  IMPRESSION:  Stable and normal for age non contrast CT appearance of the brain.  Study discussed by telephone with Dr. Noel Christmas on 12/14/2012  at 1549 hr.  8/20 - EKG: NSR. ? q waves in v1-v3 though appears to have slight R wave. Similar to previous.   Ezzard Flax, Med Student 12/14/2012, 7:28 PM FPTS Intern pager: [phone], text pages welcome  Family Medicine Upper Level Addendum:   I have seen and examined the patient independently, discussed with Student Doctor blank, fully reviewed the H+P and agree with it's contents with the additions as noted in blue text. My independent exam is below.   S: slurred speech from earlier today has resolved.   O: BP 155/83  Pulse 64  Temp(Src)  97.5 F (36.4 C) (Oral)  Resp 18  Ht 5\' 2"  (1.575 m)  Wt 165 lb 12.6 oz (75.2 kg)  BMI 30.31 kg/m2  SpO2 98% Gen: NAD, resting comfortably in bed HEENT: NCAT, MMM, PERRLA  CV: RRR no mrg  Lungs: CTAB  Abd: soft/nontender/nondistended/normal bowel sounds  MSK: moves all extremities, no edema. S/p Remote R BKA Skin: warm and dry, no rash  Neuro: CN II-XII intact, sensation and reflexes normal throughout, 5/5 muscle strength in bilateral upper and lower extremities. Normal finger to nose. Normal rapid alternating movements. No pronator drift. Did not stand patient for romberg as was not in gown.   A/P:   67 year old female with hyperlipidemia and not on regular aspirin presenting with TIA -antiplatelet ASA 81mg  -start statin (lipitor) -MRI/MRA; carotid doplers; 2 d echo  -neuro checks -probable discharge tomorrow pending completion of above workup -will return to [facility] for [facility] care (is considering transition to another  provider though) -PT/OT/SLP consults per neuro request though patient seems to be at baseline -allow permissive hypertension for now  Tana Conch, MD, PGY-3 12/14/2012 11:14 PM

## 2012-12-14 NOTE — Consult Note (Addendum)
Referring Physician: Dr. Eber Hong    Chief Complaint: Lightheadedness and transient slurred speech.  HPI: Catherine [name] is an 67 y.o. female a history of arthritis and knee replacement as well as GERD, brought to the emergency room by EMS in code stroke status with slurred speech weakness and lightheadedness. She was last seen normal at 9:30 AM today. Has no previous history of stroke nor TIA. Patient has not been on antiplatelet therapy. CT scan of her head showed no acute intracranial abnormality. NIH stroke score was 0. Speech improved after arriving in the emergency room.  LSN: 9:30 AM on 12/14/2012 tPA Given: No: Rapidly resolving deficits MRankin: 0  Past Medical History  Diagnosis Date  . Arthritis   . GERD (gastroesophageal reflux disease)   . Cataract     Family History  Problem Relation Age of Onset  . Dementia Mother   . Cancer Mother   . Cancer Father      Medications: I have reviewed the patient's current medications.  ROS: History obtained from spouse and the patient  General ROS: negative for - chills, fatigue, fever, night sweats, weight gain or weight loss Psychological ROS: negative for - behavioral disorder, hallucinations, memory difficulties, mood swings or suicidal ideation Ophthalmic ROS: negative for - blurry vision, double vision, eye pain or loss of vision ENT ROS: negative for - epistaxis, nasal discharge, oral lesions, sore throat, tinnitus or vertigo Allergy and Immunology ROS: negative for - hives or itchy/watery eyes Hematological and Lymphatic ROS: negative for - bleeding problems, bruising or swollen lymph nodes Endocrine ROS: negative for - galactorrhea, hair pattern changes, polydipsia/polyuria or temperature intolerance Respiratory ROS: negative for - cough, hemoptysis, shortness of breath or wheezing Cardiovascular ROS: negative for - chest pain, dyspnea on exertion, edema or irregular heartbeat Gastrointestinal ROS: negative for -  abdominal pain, diarrhea, hematemesis, nausea/vomiting or stool incontinence Genito-Urinary ROS: negative for - dysuria, hematuria, incontinence or urinary frequency/urgency Musculoskeletal ROS: negative for - joint swelling or muscular weakness Neurological ROS: as noted in HPI Dermatological ROS: negative for rash and skin lesion changes  Physical Examination: Blood pressure 176/74, pulse 61, temperature 98.3 F (36.8 C), temperature source Oral, resp. rate 16, weight 76.658 kg (169 lb), SpO2 98.00%.  Neurologic Examination: Mental Status: Alert, oriented, thought content appropriate.  Speech fluent without evidence of aphasia. Able to follow commands without difficulty. Cranial Nerves: II-Visual fields were normal. III/IV/VI-Pupils were equal and reacted. Extraocular movements were full and conjugate.    V/VII-no facial numbness and no facial weakness. VIII-normal. X-normal speech and symmetrical palatal movement. Motor: 5/5 bilaterally with normal tone and bulk Sensory: Normal throughout. Deep Tendon Reflexes: 2+ and symmetric. Plantars: Flexor bilaterally Cerebellar: Normal finger-to-nose testing.  Ct Head Wo Contrast  12/14/2012   CLINICAL DATA:  67 year old female headache, dizziness. Slurred speech.  EXAM: CT HEAD WITHOUT CONTRAST  TECHNIQUE: Contiguous axial images were obtained from the base of the skull through the vertex without intravenous contrast.  COMPARISON:  08/16/2012.  FINDINGS: Visualized paranasal sinuses and mastoids are clear. Postoperative changes to the globes. Visualized scalp soft tissues are within normal limits. No acute osseous abnormality identified.  Cerebral volume is within normal limits for age. No midline shift, ventriculomegaly, mass effect, evidence of mass lesion, intracranial hemorrhage or evidence of cortically based acute infarction. Gray-white matter differentiation is within normal limits throughout the brain. No suspicious intracranial vascular  hyperdensity.  IMPRESSION: Stable and normal for age non contrast CT appearance of the brain.  Study discussed  by telephone with Dr. Noel Christmas on 12/14/2012 at 1549 hr.   Electronically Signed   By: Augusto Gamble   On: 12/14/2012 15:58    Assessment: 67 y.o. female present possible TIA.  Stroke Risk Factors - none  Plan: 1. HgbA1c, fasting lipid panel 2. MRI, MRA  of the brain without contrast 3. PT consult, OT consult, Speech consult 4. Echocardiogram 5. Carotid dopplers 6. Prophylactic therapy-Antiplatelet med: Aspirin 81 mg/day 7. Risk factor modification 8. Telemetry monitoring     C.R. Roseanne Reno, MD [facility] [phone]  12/14/2012, 5:17 PM

## 2012-12-14 NOTE — Code Documentation (Signed)
67 yo female who went to have her teeth cleaned this AM at 0940, getting out of the chair at 1020 and feeling very dizzy and having to hold on to the wall to prevent falling; she also had mild headache and difficulty processing what they were saying to her.  She managed to drive herself home, ate a PBJ sandwich w/o difficulty  and immediately took a nap. When she awoke, she states the ha was much worse, and she was having difficulty speaking. Family decided to call EMS.  They activated code stroke at 1517. EMS reports BP  was 190/110 and speech very slurred. Speech did improve en route. CBG 102. CT unremarkable; NIHSS is 0, and pt is outside window to treat. She does report similar episode in the spring. Daughter has migraines, but pt denies ever having had one. Dr. Roseanne Reno explained plan to admit for stroke w/u, and what that entails.  Pt is agreeable with plan.

## 2012-12-14 NOTE — ED Notes (Signed)
4 N unable to take report 

## 2012-12-14 NOTE — ED Notes (Signed)
Pt in via EMS, per EMS- pt was at dentist office this morning, got to appointment at 0940 and while at appointment started feeling dizzy, developed slurred speech, and noted unsteady gait when she got up to leave, pt then drove self home and took a nap, also states that she developed a headache while at the dentist office, upon EMS arrival pt was hypertensive at 190/110 and CBG was 102, IV started PTA, pt alert and oriented upon arrival to ED

## 2012-12-15 ENCOUNTER — Observation Stay (HOSPITAL_COMMUNITY): Payer: BC Managed Care – PPO

## 2012-12-15 DIAGNOSIS — I369 Nonrheumatic tricuspid valve disorder, unspecified: Secondary | ICD-10-CM

## 2012-12-15 DIAGNOSIS — G459 Transient cerebral ischemic attack, unspecified: Secondary | ICD-10-CM

## 2012-12-15 LAB — GLUCOSE, CAPILLARY: Glucose-Capillary: 152 mg/dL — ABNORMAL HIGH (ref 70–99)

## 2012-12-15 LAB — LIPID PANEL: Total CHOL/HDL Ratio: 5.6 RATIO

## 2012-12-15 LAB — HEMOGLOBIN A1C: Mean Plasma Glucose: 117 mg/dL — ABNORMAL HIGH (ref <117)

## 2012-12-15 NOTE — Progress Notes (Signed)
Stroke Team Progress Note  HISTORY Catherine [name] is an 67 y.o. female a history of arthritis and knee replacement as well as GERD, brought to the emergency room by EMS in code stroke status with slurred speech weakness and lightheadedness. She was last seen normal at 9:30 AM today. Has no previous history of stroke nor TIA. Patient has not been on antiplatelet therapy. CT scan of her head showed no acute intracranial abnormality. NIH stroke score was 0. Speech improved after arriving in the emergency room.  She was LKW at 0940, patient had dizziness, headache. Family called EMS and activate code stroke at 59.  Patient was not a TPA candidate secondary to out of window. She was admitted to for further evaluation and treatment.  SUBJECTIVE Somewhat dizzy but better. Lying in bed. 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  OBJECTIVE Most recent Vital Signs: Filed Vitals:   12/15/12 1148 12/15/12 1150 12/15/12 1152 12/15/12 1156  BP: 133/62 151/70 146/71 142/79  Pulse: 62 65 75 76  Temp: 97.9 F (36.6 C)     TempSrc: Oral     Resp: 20     Height:      Weight:      SpO2: 100%      CBG (last 3)   Recent Labs  12/14/12 2126  GLUCAP 152*    IV Fluid Intake:   . sodium chloride 75 mL/hr at 12/14/12 2057    MEDICATIONS  . aspirin EC  81 mg Oral Daily  . atorvastatin  40 mg Oral q1800  . heparin  5,000 Units Subcutaneous Q8H   PRN:  acetaminophen  Diet:  General thin liquids Activity:   Bathroom privileges with assistance DVT Prophylaxis:  Heparin SQ  CLINICALLY SIGNIFICANT STUDIES Basic Metabolic Panel:   Recent Labs Lab 12/14/12 1552 12/14/12 1603  NA 140 141  K 4.4 4.2  CL 103 104  CO2 29  --   GLUCOSE 126* 125*  BUN 14 15  CREATININE 0.69 0.90  CALCIUM 10.2  --    Liver Function Tests:   Recent Labs Lab 12/14/12 1552  AST 21  ALT 15  ALKPHOS 86  BILITOT 0.7  PROT 7.1  ALBUMIN 3.9   CBC:   Recent Labs Lab  12/14/12 1552 12/14/12 1603  WBC 6.4  --   NEUTROABS 4.2  --   HGB 13.6 13.9  HCT 39.4 41.0  MCV 87.2  --   PLT 280  --    Coagulation:   Recent Labs Lab 12/14/12 1552  LABPROT 12.1  INR 0.91   Cardiac Enzymes:   Recent Labs Lab 12/14/12 1552  TROPONINI <0.30   Urinalysis:   Recent Labs Lab 12/14/12 1642  COLORURINE YELLOW  LABSPEC 1.007  PHURINE 8.0  GLUCOSEU NEGATIVE  HGBUR NEGATIVE  BILIRUBINUR NEGATIVE  KETONESUR NEGATIVE  PROTEINUR NEGATIVE  UROBILINOGEN 0.2  NITRITE NEGATIVE  LEUKOCYTESUR TRACE*   Lipid Panel    Component Value Date/Time   CHOL 240* 12/15/2012 0740   LDL 130  HgbA1C  Lab Results  Component Value Date   HGBA1C 5.7* 12/14/2012    Urine Drug Screen:     Component Value Date/Time   LABOPIA NONE DETECTED 12/14/2012 1642   COCAINSCRNUR NONE DETECTED 12/14/2012 1642   LABBENZ NONE DETECTED 12/14/2012 1642   AMPHETMU NONE DETECTED 12/14/2012 1642   THCU NONE DETECTED 12/14/2012 1642   LABBARB NONE DETECTED 12/14/2012 1642    Alcohol Level:   Recent  Labs Lab 12/14/12 1552  ETH <11    Ct Head Wo Contrast 12/14/2012 Stable and normal for age non contrast CT appearance of the brain.     MRI of the brain    MRA of the brain    2D Echocardiogram    Carotid Doppler  Findings suggest 1-39% internal carotid artery stenosis bilaterally. Vertebral arteries are patent with antegrade flow.  CXR    EKG  normal sinus rhythm.   Therapy Recommendations home health  Physical Exam   Pleasant middle aged lady not in distress.Awake alert. Afebrile. Head is nontraumatic. Neck is supple without bruit. Hearing is normal. Cardiac exam no murmur or gallop. Lungs are clear to auscultation. Distal pulses are well felt. Neurological Exam ;  Awake  Alert oriented x 3. Normal speech and language.eye movements full without nystagmus.fundi were not visualized. Vision acuity and fields appear normal. Hearing is normal. Palatal movements are normal. Face  symmetric. Tongue midline. Normal strength, tone, reflexes and coordination. Normal sensation. Gait deferred. ASSESSMENT Catherine [name] is a 67 y.o. female presenting with slurred speech, vertigo, headache and generalized weakness. Imaging confirms a no acute  infarct. Infarct felt to be embolic secondary to small vessel disease. These symptoms can also be seen with neck manipulation (vertebral)  On no antithrombotics prior to admission. Now on aspirin 81 mg orally every day for secondary stroke prevention. Work up underway.   Hyperlipidemia, LDL 130, goal < 120 in non diabetic patients, on statin therapy  GERD  Hospital day # 1  TREATMENT/PLAN  Continue aspirin 81 mg orally every day for secondary stroke prevention.  Await 2D echo, MR results  Risk factor modification  Have patient follow up with Dr. Pearlean Brownie in 2 months.  Gwendolyn Lima. Manson Passey, [facility], MBA, MHA [facility] Pager: [phone] 12/15/2012 2:31 PM  I have personally obtained a history, examined the patient, evaluated imaging results, and formulated the assessment and plan of care. I agree with the above. Delia Heady, MD

## 2012-12-15 NOTE — Discharge Summary (Signed)
Family Medicine Teaching [facility] Discharge Summary  Patient name: Catherine [name] Medical record number: [id number] Date of birth: Jun 10, 1945 Age: 67 y.o. Gender: female Date of Admission: 12/14/2012  Date of Discharge: 12/16/2012 Admitting Physician: Janit Pagan, MD  Primary Care Provider: Delanna Notice Consultants: NEUROLOGY  Indication for Hospitalization: Catherine [name] is a 67 y.o. year old female presenting with a probable TIA. PMH is significant for hyperlipidemia, rt knee replacement, arthritis, GERD, and cataract. No reported history of diabetes, hypertension, smoking.  Discharge Diagnoses/Problem List:  Dizziness/dysarthria Hyperlipidemia Headache  Disposition: home  Discharge Condition: improved  Brief Hospital Course:  Patient presented to the ED with symptoms suggestive of a TIA but was largely asymptomatic by the time she presented to the ED.  Earlier in the day, she had been "wobbly" with slurring of speech, lethargy and a headache.  I-stat troponins were negative.  Head CT without contrast and EKG performed in the ED were unremarkable.  Believed to be a TIA given report of neurologic symptoms, though a migraine is also a possibility (though would not expect slurring of speech).  #TIA (dysarthria ) - neuro consulted, started on stroke protocol: - - MRI, MRA w/o contrast to rule out clot or ischemia; 5mm stenotic segment noted in ACA but has good collateral circulation.   Will f/u with neuro in outpatient setting in 2 months. *tried to reach neurology to discuss stenotic segment before discharge but unable to do so (with 4 pages), believed this could simply be followed up outpatient.  - -  2-d echo to exam heart; it was unremarkable - - carotid dopplers to look for plaques:  unremarkable - patient continued to improved during the course of her stay, but was noted to have a + Romberg and pronator drift signs on exam on 8/21.  On repeat exam 8/22, these  were both negative and pt was much improved. - orthostatic exam on 8/21 was negative - PT assessed and initially recommended continued PT for mobility.  Upon f/u consult, they ruled she no longer needed additional PT. - patient was restarted on a daily aspirin 81mg , which she had taken on a irregular basis prior to admission.  Will continue upon discharge. -Risk stratification: a1c 5.7; cholesterol elevated as below - patient was hypertensive around 155/80 but this was allowed for permissive hypertensive purposes.  By 8/22, she was normotensive.  #hyperlipidemia  - patient was started lipitor 40mg  given LDL = 130, total cholesterol = 240.  Pt to f/u with PCP in outpatient setting. - reports prior muscle weakness on crestor. Patient may take coenzyme q10.   #headache  - patient started on tylenol 650mg  PRN for pain - resolved by discharge  Issues for Follow Up:  #dizziness/dysarthria/hyperlipidemia - recommend outpatient follow-up with PCP and adherence to aspirin and statin. -patient with previous statin intolerance. Please consider coenzyme q10 or alternative dosing to lipitor.  - f/u with neurology (Dr. Pearlean Brownie) in 2 months  Significant Procedures: NONE  Significant Labs and Imaging:   Recent Labs Lab 12/14/12 1552 12/14/12 1603  WBC 6.4  --   HGB 13.6 13.9  HCT 39.4 41.0  PLT 280  --     Recent Labs Lab 12/14/12 1552 12/14/12 1603  NA 140 141  K 4.4 4.2  CL 103 104  CO2 29  --   GLUCOSE 126* 125*  BUN 14 15  CREATININE 0.69 0.90  CALCIUM 10.2  --   ALKPHOS 86  --   AST 21  --  ALT 15  --   ALBUMIN 3.9  --    8/20 - CT:  FINDINGS:  Visualized paranasal sinuses and mastoids are clear. Postoperative  changes to the globes. Visualized scalp soft tissues are within  normal limits. No acute osseous abnormality identified.  Cerebral volume is within normal limits for age. No midline shift,  ventriculomegaly, mass effect, evidence of mass lesion, intracranial   hemorrhage or evidence of cortically based acute infarction.  Gray-white matter differentiation is within normal limits throughout  the brain. No suspicious intracranial vascular hyperdensity.  IMPRESSION:  Stable and normal for age non contrast CT appearance of the brain.  Study discussed by telephone with Dr. Noel Christmas on 12/14/2012  at 1549 hr.   8/20 - EKG: NSR. ? q waves in v1-v3 though appears to have slight R wave. Similar to previous.   8/21 - carotid duplex: Findings suggest 1-39% internal carotid artery stenosis bilaterally. Vertebral arteries are patent with antegrade flow.  8/21 - ECHO.  Study Conclusions - Left ventricle: The cavity size was normal. Wall thickness was normal. Systolic function was normal. The estimated ejection fraction was in the range of 55% to 60%. Wall motion was normal; there were no regional wall motion abnormalities. Doppler parameters are consistent with abnormal left ventricular relaxation (grade 1 diastolic dysfunction). - Aortic valve: Trivial regurgitation. - Pulmonary arteries: Systolic pressure was mildly increased. PA peak pressure: 38mm Hg (S).   8/21 - MRI/MRA.  IMPRESSION:  MRI HEAD IMPRESSION  No acute intracranial abnormality. Mild for age nonspecific signal  changes. See MRA findings and impression.  MRA HEAD IMPRESSION  1. 5 mm segment of loss of flow signal in the left ACA at the  callosomarginal artery level. This might be artifact but appears to  be genuine, representing very high-grade stenosis or near occlusion  of the vessel. However, there is preserved flow signal distal to  this lesion.  2. Otherwise negative intracranial MRA.  Outstanding Results: NONE  Discharge Medications:    Medication List    STOP taking these medications       butalbital-acetaminophen-caffeine 50-325-40-30 MG per capsule  Commonly known as:  FIORICET/CODEINE     RED YEAST RICE PO      TAKE these medications       ALOE  VERA PO  Take 1 tablet by mouth 2 (two) times daily.     aspirin 81 MG EC tablet  Take 1 tablet (81 mg total) by mouth daily.     atorvastatin 40 MG tablet  Commonly known as:  LIPITOR  Take 1 tablet (40 mg total) by mouth daily at 6 PM.     B-complex with vitamin C tablet  Take 1 tablet by mouth daily.     BIOTIN PO  Take 1 tablet by mouth daily.     calcium carbonate 600 MG Tabs tablet  Commonly known as:  OS-CAL  Take 600 mg by mouth 2 (two) times daily with a meal.     CINNAMON PO  Take 1 tablet by mouth daily.     clindamycin 150 MG capsule  Commonly known as:  CLEOCIN  Take 600 mg by mouth See admin instructions. Takes prior to dental appt     GINKGO PO  Take 1 tablet by mouth daily.     promethazine 25 MG tablet  Commonly known as:  PHENERGAN  Take 1 tablet (25 mg total) by mouth every 8 (eight) hours as needed for nausea (headache).  Discharge Instructions: Please refer to Patient Instructions section of EMR for full details.  Patient was counseled important signs and symptoms that should prompt return to medical care, changes in medications, dietary instructions, activity restrictions, and follow up appointments.   Follow-Up Appointments: f/u with Dr. Pearlean Brownie (neurology) in 2 months  Ezzard Flax, Med Student 12/16/2012, 9:50 AM  Family Medicine Upper Level Addendum:   I have seen and examined the patient independently, discussed with Student Doctor Marquita Palms, fully reviewed the discharge summary and agree with it's contents with the additions as noted in blue text.  Tana Conch, MD, PGY-3 12/16/2012 5:58 PM

## 2012-12-15 NOTE — Progress Notes (Signed)
Family Medicine Teaching Service Daily Progress Note Intern Pager: [phone]  Patient name: Catherine [name] Medical record number: [id number] Date of birth: February 12, 1946 Age: 67 y.o. Gender: female  Primary Care Provider: Ricci Barker, PA-C Consultants: NEUROLOGY Code Status: FULL  Pt Overview and Major Events to Date:   Catherine [name] is a 67 y.o. year old female presenting with a probable TIA. PMH is significant for hyperlipidemia, rt knee replacement, arthritis, GERD, and cataract. No reported history diabetes, hypertension, smoking   8/20 - normal CT, EKG 8/21 - MRI/MRA w/o constrast, 2-d echo, carotid dopplers  Assessment and Plan:  #dizziness/dysarthria - likely TIA given report of neurologic symptoms which had resolved by the time of admission. Migraine is also a possibility though would not expect slurring of speech.  - neuro consulted, appreciate rec's  [ ]  f/u MRI, MRA w/o contrast to rule out clot or ischemia  [ ]  f/u 2-d echo  [ ]  f/u carotid dopplers to look for plaques  - + Romberg, pronator drift signs on exam, PT has seen and recommends continued PT for mobility - will get orthostatics and recheck romberg and pronator drift later today  - continue aspirin 81mg  (did not take in past week and only intermittently took in past)  - continue telemetry  - a1c 5.7 - i-stat troponins non-elevated  [ ]  hypertension- f/u blood pressure, will allow permissive hypertension for now   #hyperlipidemia - continue lipitor 40mg , LDL = 130, total = 240  - reports muscle weakness on crestor   #headache - Tylenol 650mg  PRN for pain  FEN/GI: general diet, NS 75 ml/hr until orthostatics available Prophylaxis: subq heparin   Disposition: home pending negative scans and resolution of symptoms  Subjective: Pt pleasant, feeling much improved since yesterday.  Still feels a little "wobbly" when standing, especially if she closes her eyes.  She also reports that food "feels" different  (doesn't taste different).  She continues to feel a slight headache behind her eyes bilaterally but denies changes in vision or photophobia.  Objective: Temp:  [97.4 F (36.3 C)-98.3 F (36.8 C)] 98.1 F (36.7 C) (08/21 0819) Pulse Rate:  [58-70] 70 (08/21 0819) Resp:  [10-23] 20 (08/21 0819) BP: (121-176)/(64-126) 155/73 mmHg (08/21 0819) SpO2:  [95 %-100 %] 100 % (08/21 0819) Weight:  [165 lb 12.6 oz (75.2 kg)-169 lb (76.658 kg)] 165 lb 12.6 oz (75.2 kg) (08/20 2200) Physical Exam: General: alert, cooperative and no distress  HEENT: PERRLA, extra ocular movement intact, neck supple with midline trachea and thyroid without masses, Oropharynx clear and normal.  Heart: S1, S2 normal, no murmur, rub or gallop, regular rate and rhythm  Lungs: clear to auscultation, no wheezes or rales and unlabored breathing  Abdomen: abdomen is soft without significant tenderness, masses, organomegaly or guarding  Extremities: extremities normal, atraumatic, no cyanosis or edema, some TTP of calves bilaterally, no swelling Skin:no rashes, no ecchymoses  Neurology: mental status, speech normal, alert and oriented x3.  Positive Romberg and pronator drift tests, pt reports change in taste sensation   Laboratory:  Recent Labs Lab 12/14/12 1552 12/14/12 1603  WBC 6.4  --   HGB 13.6 13.9  HCT 39.4 41.0  PLT 280  --     Recent Labs Lab 12/14/12 1552 12/14/12 1603  NA 140 141  K 4.4 4.2  CL 103 104  CO2 29  --   BUN 14 15  CREATININE 0.69 0.90  CALCIUM 10.2  --   PROT 7.1  --  BILITOT 0.7  --   ALKPHOS 86  --   ALT 15  --   AST 21  --   GLUCOSE 126* 125*   Imaging/Diagnostic Tests: 8/20 - CT:  FINDINGS:  Visualized paranasal sinuses and mastoids are clear. Postoperative  changes to the globes. Visualized scalp soft tissues are within  normal limits. No acute osseous abnormality identified.  Cerebral volume is within normal limits for age. No midline shift,  ventriculomegaly, mass  effect, evidence of mass lesion, intracranial  hemorrhage or evidence of cortically based acute infarction.  Gray-white matter differentiation is within normal limits throughout  the brain. No suspicious intracranial vascular hyperdensity.  IMPRESSION:  Stable and normal for age non contrast CT appearance of the brain.  Study discussed by telephone with Dr. Noel Christmas on 12/14/2012  at 1549 hr.  8/20 - EKG: NSR. ? q waves in v1-v3 though appears to have slight R wave. Similar to previous.    Ezzard Flax, Med Student 12/15/2012, 9:17 AM FPTS Intern pager: [phone], text pages welcome  Family Medicine Upper Level Addendum:  I have seen and examined the patient independently, discussed with Student Doctor blank, fully reviewed the H+P and agree with it's contents with the additions as noted in blue text. My independent exam is below.   S: slight frontal headache this morning  O: BP 155/73  Pulse 70  Temp(Src) 98.1 F (36.7 C) (Oral)  Resp 20  Ht 5\' 2"  (1.575 m)  Wt 165 lb 12.6 oz (75.2 kg)  BMI 30.31 kg/m2  SpO2 100% Gen: NAD, resting comfortably in bed  HEENT: NCAT, MMM, PERRLA  CV: RRR no mrg  Lungs: CTAB  Abd: soft/nontender/nondistended/normal bowel sounds  MSK: moves all extremities, no edema. S/p Remote R BKA  Skin: warm and dry, no rash  Neuro: CN II-XII intact, sensation and reflexes normal throughout, 5/5 muscle strength in bilateral upper and lower extremities. Normal finger to nose. Normal rapid alternating movements. Did not stand patient for romberg.   A/P:  67 year old female with hyperlipidemia and not on regular aspirin presenting with TIA  -antiplatelet ASA 81mg   -statin (lipitor)  -pending MRI/MRA; carotid doplers; 2 d echo and hopeful for d/c later today -neuro checks  -will return to [facility] for further care (is considering transition to another provider though)  -PT/OT/SLP consults per neuro request though patient seems to be at baseline  -HHPT  recommended; pending other consults -allow permissive hypertension for now   Tana Conch, MD, PGY-3  12/15/2012 9:58 AM

## 2012-12-15 NOTE — Progress Notes (Signed)
Echo Lab  2D Echocardiogram completed.  Aneta Hendershott L Kacin Dancy, RDCS 12/15/2012 11:03 AM

## 2012-12-15 NOTE — Progress Notes (Signed)
FMTS Attending Admission Note: Catherine Eniola,MD I  have seen and examined this patient, reviewed their chart. I have discussed this patient with the resident. I agree with the resident's findings, assessment and care plan.  

## 2012-12-15 NOTE — Evaluation (Signed)
Speech Language Pathology Evaluation Patient Details Name: Catherine [name] MRN: [id number] DOB: August 05, 1945 Today's Date: 12/15/2012 Time: 4098-1191 SLP Time Calculation (min): 12 min  Problem List:  Patient Active Problem List   Diagnosis Date Noted  . TIA (transient ischemic attack) 12/14/2012  . Essential hypertension, benign 12/14/2012  . Other and unspecified hyperlipidemia 12/14/2012   Past Medical History:  Past Medical History  Diagnosis Date  . Arthritis   . GERD (gastroesophageal reflux disease)   . Cataract    Past Surgical History:  Past Surgical History  Procedure Laterality Date  . Eye surgery     HPI:  Catherine [name] is a 67 y.o. year old female presenting with a probable TIA. PMH is significant for hyperlipidemia, rt knee replacement, arthritis, GERD, and cataract   Assessment / Plan / Recommendation Clinical Impression  Pt presents with all cognitive-communicative parameters WNL per assessment.  No SLP needs identified.  Will sign off.    SLP Assessment  Patient does not need any further Speech Lanaguage Pathology Services       SLP Evaluation Prior Functioning  Cognitive/Linguistic Baseline: Within functional limits Type of Home: House Available Help at Discharge: Family   Cognition  Overall Cognitive Status: Within Functional Limits for tasks assessed ; executive functioning wnl; + planning, sequencing, reasoning.   Comprehension  Auditory Comprehension Overall Auditory Comprehension: Appears within functional limits for tasks assessed Visual Recognition/Discrimination Discrimination: Within Function Limits Reading Comprehension Reading Status: Within funtional limits    Expression Expression: excellent divergent naming; mental flexibility Primary Mode of Expression: Verbal Verbal Expression Overall Verbal Expression: Appears within functional limits for tasks assessed Written Expression Dominant Hand: Right Written Expression: Within  Functional Limits   Oral / Motor Oral Motor/Sensory Function Overall Oral Motor/Sensory Function: Appears within functional limits for tasks assessed Motor Speech Overall Motor Speech: Appears within functional limits for tasks assessed   GO     Catherine [name] Catherine [name] 12/15/2012, 2:59 PM

## 2012-12-15 NOTE — Evaluation (Signed)
Occupational Therapy Evaluation Patient Details Name: Catherine [name] MRN: [id number] DOB: Mar 13, 1946 Today's Date: 12/15/2012 Time: 4098-1191 OT Time Calculation (min): 28 min  OT Assessment / Plan / Recommendation History of present illness 67 y.o. year old female presenting with a probable TIA. PMH is significant for hyperlipidemia, rt knee replacement, arthritis, GERD, and cataract. Pt with new onset of dizziness and dysarthria   Clinical Impression   Patient evaluated by Occupational Therapy with no further acute OT needs identified. All education has been completed and the patient has no further questions. See below for any follow-up Occupational Therapy or equipment needs. OT to sign off. Thank you for referral.      OT Assessment  Patient does not need any further OT services    Follow Up Recommendations  No OT follow up    Barriers to Discharge      Equipment Recommendations  None recommended by OT    Recommendations for Other Services    Frequency       Precautions / Restrictions Precautions Precautions: Fall Restrictions Weight Bearing Restrictions: No   Pertinent Vitals/Pain HA in forehead area Reports fatigued eyes     ADL  Eating/Feeding: Independent Where Assessed - Eating/Feeding: Chair Grooming: Teeth care;Modified independent Where Assessed - Grooming: Unsupported standing Lower Body Dressing: Modified independent Where Assessed - Lower Body Dressing: Unsupported sitting (don socks) Toilet Transfer: Supervision/safety Toilet Transfer Method: Sit to Barista: Regular height toilet Equipment Used: Gait belt Transfers/Ambulation Related to ADLs: Pt ambulating with no balance deficits noted. pt reports increased HA after ambulation. Pt reports feeling unsteady ADL Comments: pt completed bed mobility, basic transfer, and sink level grooming. Pt reports not normally having HA . pt reports last headache took 3 days to go away and this  all started within last few weeks. Pt reports feeling fatigued at eyes. Pt able to reach clock and locate all necessary adl items    OT Diagnosis:    OT Problem List:   OT Treatment Interventions:     OT Goals(Current goals can be found in the care plan section) Acute Rehab OT Goals Patient Stated Goal: pt is expecting a new grandbaby in September.    Visit Information  Last OT Received On: 12/15/12 Assistance Needed: +1 History of Present Illness: 67 y.o. year old female presenting with a probable TIA. PMH is significant for hyperlipidemia, rt knee replacement, arthritis, GERD, and cataract. Pt with new onset of dizziness and dysarthria       Prior Functioning     Home Living Family/patient expects to be discharged to:: Private residence Living Arrangements: Spouse/significant other;Children Available Help at Discharge: Family Type of Home: House Home Access: Level entry Home Layout: Two level;Able to live on main level with bedroom/bathroom Home Equipment: Gilmer Mor - single point Prior Function Level of Independence: Independent Communication Communication: No difficulties Dominant Hand: Right         Vision/Perception Vision - History Baseline Vision: Wears glasses only for reading Vision - Assessment Eye Alignment: Within Functional Limits   Cognition  Cognition Arousal/Alertness: Awake/alert Behavior During Therapy: WFL for tasks assessed/performed Overall Cognitive Status: Within Functional Limits for tasks assessed    Extremity/Trunk Assessment Upper Extremity Assessment Upper Extremity Assessment: LUE deficits/detail LUE Deficits / Details: 4+ out 5 LUE Coordination:  (pt reports just feels weaker) Lower Extremity Assessment Lower Extremity Assessment: Overall WFL for tasks assessed Cervical / Trunk Assessment Cervical / Trunk Assessment: Normal     Mobility Bed Mobility Bed Mobility:  Supine to Sit;Sitting - Scoot to Edge of Bed;Sit to Supine Supine  to Sit: 5: Supervision Sitting - Scoot to Edge of Bed: 5: Supervision Sit to Supine: 5: Supervision Details for Bed Mobility Assistance: pt moves slowly, but able to complete without A.   Transfers Sit to Stand: 5: Supervision;With upper extremity assist;From bed Stand to Sit: 5: Supervision;With upper extremity assist;To bed Details for Transfer Assistance: Demos good use of UEs, but indicates dizziness while standing.       Exercise     Balance Balance Balance Assessed: Yes Static Standing Balance Static Standing - Balance Support: No upper extremity supported Static Standing - Level of Assistance: 5: Stand by assistance High Level Balance High Level Balance Activites: Turns;Direction changes;Sudden stops;Head turns High Level Balance Comments: pt without LOB during high level balance, but requires increased time to complete turns and stops.     End of Session OT - End of Session Activity Tolerance: Patient tolerated treatment well Patient left: in bed;with call bell/phone within reach Nurse Communication: Mobility status  GO     Harrel Carina [facility] 12/15/2012, 9:32 AM Pager: [phone]

## 2012-12-15 NOTE — H&P (Signed)
FMTS Attending Admission Note: Catherine Levy MD [phone] pager office [phone] I  have seen and examined this patient, reviewed their chart. I have discussed this patient with the resident. I agree with the resident's findings, assessment and care plan. My exam this morning revealed a very slight left corner of mouth droop. She did not think it was that different from baseline. She was still having headache. Pt was visiting. MRI was pending.

## 2012-12-15 NOTE — Progress Notes (Signed)
*  PRELIMINARY RESULTS* Vascular Ultrasound Carotid Duplex (Doppler) has been completed.   Findings suggest 1-39% internal carotid artery stenosis bilaterally. Vertebral arteries are patent with antegrade flow.  12/15/2012 10:54 AM Gertie Fey, RVT, RDCS, RDMS

## 2012-12-15 NOTE — Evaluation (Signed)
Physical Therapy Evaluation Patient Details Name: EZRI FANGUY MRN: [id number] DOB: [birth year] Today's Date: 12/15/2012 Time: 3244-0102 PT Time Calculation (min): 27 min  PT Assessment / Plan / Recommendation History of Present Illness  67 y.o. year old female presenting with a probable TIA. PMH is significant for hyperlipidemia, rt knee replacement, arthritis, GERD, and cataract. Pt with new onset of dizziness and dysarthria  Clinical Impression  Pt very motivated to maintain mobility, however limited by headache, dizziness and overall feeling fatigued and heavy.  Will continue to follow.      PT Assessment  Patient needs continued PT services    Follow Up Recommendations  Home health PT;Supervision - Intermittent    Does the patient have the potential to tolerate intense rehabilitation      Barriers to Discharge        Equipment Recommendations  None recommended by PT    Recommendations for Other Services     Frequency Min 4X/week    Precautions / Restrictions Precautions Precautions: Fall Restrictions Weight Bearing Restrictions: No   Pertinent Vitals/Pain Indicates headache with mobility, did not rate.  MD in room.        Mobility  Bed Mobility Bed Mobility: Supine to Sit;Sitting - Scoot to Edge of Bed;Sit to Supine Supine to Sit: 5: Supervision Sitting - Scoot to Edge of Bed: 5: Supervision Sit to Supine: 5: Supervision Details for Bed Mobility Assistance: pt moves slowly, but able to complete without A.   Transfers Transfers: Sit to Stand;Stand to Sit Sit to Stand: 5: Supervision;With upper extremity assist;From bed Stand to Sit: 5: Supervision;With upper extremity assist;To bed Details for Transfer Assistance: Demos good use of UEs, but indicates dizziness while standing.   Ambulation/Gait Ambulation/Gait Assistance: 4: Min guard Ambulation Distance (Feet): 500 Feet Assistive device: None Ambulation/Gait Assistance Details: pt generally unsteady and  moves cautiously.  pt indicates headache while ambulating, but no sensitivity to light or sounds.   Gait Pattern: Step-through pattern;Decreased stride length Stairs: No Wheelchair Mobility Wheelchair Mobility: No Modified Rankin (Stroke Patients Only) Pre-Morbid Rankin Score: No symptoms Modified Rankin: Moderate disability    Exercises     PT Diagnosis: Difficulty walking  PT Problem List: Decreased activity tolerance;Decreased balance;Decreased mobility;Decreased coordination;Decreased knowledge of use of DME PT Treatment Interventions: DME instruction;Gait training;Stair training;Functional mobility training;Therapeutic activities;Therapeutic exercise;Balance training;Neuromuscular re-education;Patient/family education     PT Goals(Current goals can be found in the care plan section) Acute Rehab PT Goals Patient Stated Goal: pt is expecting a new grandbaby in September.   PT Goal Formulation: With patient Time For Goal Achievement: 12/29/12 Potential to Achieve Goals: Good  Visit Information  Last PT Received On: 12/15/12 Assistance Needed: +1 PT/OT Co-Evaluation/Treatment: Yes History of Present Illness: 67 y.o. year old female presenting with a probable TIA. PMH is significant for hyperlipidemia, rt knee replacement, arthritis, GERD, and cataract. Pt with new onset of dizziness and dysarthria       Prior Functioning  Home Living Family/patient expects to be discharged to:: Private residence Living Arrangements: Spouse/significant other;Children Available Help at Discharge: Family Type of Home: House Home Access: Level entry Home Layout: Two level;Able to live on main level with bedroom/bathroom Home Equipment: Gilmer Mor - single point Prior Function Level of Independence: Independent Communication Communication: No difficulties Dominant Hand: Right    Cognition  Cognition Arousal/Alertness: Awake/alert Behavior During Therapy: WFL for tasks  assessed/performed Overall Cognitive Status: Within Functional Limits for tasks assessed    Extremity/Trunk Assessment Upper Extremity Assessment Upper Extremity Assessment:  Defer to OT evaluation Lower Extremity Assessment Lower Extremity Assessment: Overall WFL for tasks assessed Cervical / Trunk Assessment Cervical / Trunk Assessment: Normal   Balance Balance Balance Assessed: Yes Static Standing Balance Static Standing - Balance Support: No upper extremity supported Static Standing - Level of Assistance: 5: Stand by assistance High Level Balance High Level Balance Activites: Turns;Direction changes;Sudden stops;Head turns High Level Balance Comments: pt without LOB during high level balance, but requires increased time to complete turns and stops.    End of Session PT - End of Session Equipment Utilized During Treatment: Gait belt Activity Tolerance: Patient tolerated treatment well Patient left: in bed;with call bell/phone within reach;with bed alarm set Nurse Communication: Mobility status  GP Functional Assessment Tool Used: Clinical Judgement Functional Limitation: Mobility: Walking and moving around Mobility: Walking and Moving Around Current Status (A5409): At least 1 percent but less than 20 percent impaired, limited or restricted Mobility: Walking and Moving Around Goal Status [phone]): 0 percent impaired, limited or restricted   Sunny Schlein, [place] [phone] 12/15/2012, 9:19 AM

## 2012-12-16 DIAGNOSIS — G459 Transient cerebral ischemic attack, unspecified: Secondary | ICD-10-CM

## 2012-12-16 LAB — GLUCOSE, CAPILLARY

## 2012-12-16 MED ORDER — ATORVASTATIN CALCIUM 40 MG PO TABS: 40.0000 mg | ORAL_TABLET | Freq: Every day | ORAL | Status: DC

## 2012-12-16 MED ORDER — ASPIRIN 81 MG PO TBEC: 81.0000 mg | DELAYED_RELEASE_TABLET | Freq: Every day | ORAL | Status: DC

## 2012-12-16 NOTE — Progress Notes (Signed)
Family Medicine Teaching Service Daily Progress Note Intern Pager: [phone]  Patient name: Catherine [name] Medical record number: [id number] Date of birth: Sep 08, 1945 Age: 67 y.o. Gender: female  Primary Care Provider: Ricci Barker, PA-C Consultants: NEUROLOGY Code Status: FULL  Pt Overview and Major Events to Date:   Catherine [name] is a 67 y.o. year old female presenting with a probable TIA. PMH is significant for hyperlipidemia, rt knee replacement, arthritis, GERD, and cataract. No reported history diabetes, hypertension, smoking   8/20 - normal CT, EKG 8/21 - MRI/MRA w/o constrast, 2-d echo, carotid dopplers  Assessment and Plan:  #dizziness/dysarthria - likely TIA given report of neurologic symptoms which had resolved by the time of admission. Migraine is also a possibility though would not expect slurring of speech.  - neuro consulted, appreciate rec's  - MRI, MRA w/o contrast.  5mm stenotic segment noted in ACA but good collateral circulation.  [ ]  Will discuss with neuro but don't anticipate major barriers to discharge. - 2-d echo unremarkable with EF 55% and grade I diastolic dysfunction - carotid dopplers unremarkable - Negative Romberg, pronator drift signs on exam, PT no longer recommending outpatient physical therapy - continue aspirin 81mg  (did not take in past week and only intermittently took in past)  - a1c 5.7 - i-stat troponins non-elevated  - blood pressure adequately controlled  #hyperlipidemia - continue lipitor 40mg , LDL = 130, total = 240  - reports muscle weakness on crestor   #headache - Tylenol 650mg  PRN for pain  FEN/GI: general diet, NS 75 ml/hr until orthostatics available Prophylaxis: subq heparin   Disposition: home  Subjective: Pt pleasant, feeling much improved since yesterday.  Her headache has almost completely resolved and she no longer feels dizzy when standing.  Eager to get home and relieved her imaging studies were mostly  unremarkable.  Objective: Temp:  [97.6 F (36.4 C)-98.1 F (36.7 C)] 97.8 F (36.6 C) (08/22 0617) Pulse Rate:  [62-76] 63 (08/22 0617) Resp:  [18-20] 18 (08/22 0617) BP: (125-155)/(60-79) 125/74 mmHg (08/22 0617) SpO2:  [95 %-100 %] 96 % (08/22 0617) Physical Exam: General: alert, cooperative and no distress  HEENT: PERRLA, extra ocular movement intact, neck supple with midline trachea and thyroid without masses, Oropharynx clear and normal.  Heart: S1, S2 normal, no murmur, rub or gallop, regular rate and rhythm  Lungs: clear to auscultation, no wheezes or rales and unlabored breathing  Abdomen: abdomen is soft without significant tenderness, masses, organomegaly or guarding  Extremities: extremities normal, atraumatic, no cyanosis or edema, some TTP of calves bilaterally, no swelling Skin:no rashes, no ecchymoses  Neurology: mental status, speech normal, alert and oriented x3.  Negative Romberg and pronator drift tests   Laboratory:  Recent Labs Lab 12/14/12 1552 12/14/12 1603  WBC 6.4  --   HGB 13.6 13.9  HCT 39.4 41.0  PLT 280  --     Recent Labs Lab 12/14/12 1552 12/14/12 1603  NA 140 141  K 4.4 4.2  CL 103 104  CO2 29  --   BUN 14 15  CREATININE 0.69 0.90  CALCIUM 10.2  --   PROT 7.1  --   BILITOT 0.7  --   ALKPHOS 86  --   ALT 15  --   AST 21  --   GLUCOSE 126* 125*   Imaging/Diagnostic Tests: 8/20 - CT:  FINDINGS:  Visualized paranasal sinuses and mastoids are clear. Postoperative  changes to the globes. Visualized scalp soft tissues are within  normal  limits. No acute osseous abnormality identified.  Cerebral volume is within normal limits for age. No midline shift,  ventriculomegaly, mass effect, evidence of mass lesion, intracranial  hemorrhage or evidence of cortically based acute infarction.  Gray-white matter differentiation is within normal limits throughout  the brain. No suspicious intracranial vascular hyperdensity.  IMPRESSION:   Stable and normal for age non contrast CT appearance of the brain.  Study discussed by telephone with Dr. Noel Christmas on 12/14/2012  at 1549 hr.  8/20 - EKG: NSR. ? q waves in v1-v3 though appears to have slight R wave. Similar to previous.   8/21 - CAROTID DOPPLERS.  Findings suggest 1-39% internal carotid artery stenosis bilaterally. Vertebral arteries are patent with antegrade flow.  8/21 - ECHO.   Study Conclusions - Left ventricle: The cavity size was normal. Wall thickness was normal. Systolic function was normal. The estimated ejection fraction was in the range of 55% to 60%. Wall motion was normal; there were no regional wall motion abnormalities. Doppler parameters are consistent with abnormal left ventricular relaxation (grade 1 diastolic dysfunction). - Aortic valve: Trivial regurgitation. - Pulmonary arteries: Systolic pressure was mildly increased. PA peak pressure: 38mm Hg (S).  8/21 - MRI/MRA.   IMPRESSION:  MRI HEAD IMPRESSION  No acute intracranial abnormality. Mild for age nonspecific signal  changes. See MRA findings and impression.  MRA HEAD IMPRESSION  1. 5 mm segment of loss of flow signal in the left ACA at the  callosomarginal artery level. This might be artifact but appears to  be genuine, representing very high-grade stenosis or near occlusion  of the vessel. However, there is preserved flow signal distal to  this lesion.  2. Otherwise negative intracranial MRA.   Catherine [name], Med Student 12/16/2012, 9:27 AM FPTS Intern pager: [phone], text pages welcome   Family Medicine Upper Level Addendum:  I have seen and examined the patient independently, discussed with Student Doctor blank, fully reviewed the H+P and agree with it's contents with the additions as noted in blue text. My independent exam is below.   S: no complaints this AM  O: BP 125/74  Pulse 63  Temp(Src) 97.8 F (36.6 C) (Oral)  Resp 18  Ht 5\' 2"  (1.575 m)  Wt 165 lb 12.6 oz  (75.2 kg)  BMI 30.31 kg/m2  SpO2 96% Gen: NAD, resting comfortably in bed  HEENT: NCAT, MMM, PERRLA  CV: RRR no mrg  Lungs: CTAB  Abd: soft/nontender/nondistended/normal bowel sounds  MSK: moves all extremities, no edema. S/p Remote knee replacement (correction to previous notes-no BKA-this was unintentionally written as knee replacement was intended)  Skin: warm and dry, no rash  Neuro: CN II-XII intact, sensation and reflexes normal throughout, 5/5 muscle strength in bilateral upper and lower extremities. Normal finger to nose. Normal rapid alternating movements. Did not stand patient for romberg.   A/P:  67 year old female with hyperlipidemia and not on regular aspirin presenting with TIA  -antiplatelet ASA 81mg   -statin (lipitor)  - MRI/MRA; carotid doplers; 2 d echo: completed. Unremarkable except for 5mm stenotic segment noted in ACA but good collateral circulation. Doubt this changes anything but want to touch base with neuro before discharge.  -will return to [facility] for [facility] care (is considering transition to another provider though)  -PT/OT/SLP consults per neuro request though patient seems to be at baseline  -PT no longer recommended -HTN has resolved  Tana Conch, MD, PGY-3  12/16/2012 12:18 PM

## 2012-12-16 NOTE — Progress Notes (Signed)
12/15/12 0928  OT Time Calculation  OT Start Time 0818  OT Stop Time 0846  OT Time Calculation (min) 28 min  OT G-codes **NOT FOR INPATIENT CLASS**  Functional Assessment Tool Used Chart review  Functional Limitation Self care  Self Care Current Status (Z6109) CI  Self Care Goal Status (U0454) CI  Self Care Discharge Status (U9811) CI  OT General Charges  $OT Visit 1 Procedure  OT Evaluation  $Initial OT Evaluation Tier I 1 Procedure  OT Treatments  $Self Care/Home Management  8-22 mins   Late entry for Jiles Harold, OTR/L by Ignacia Palma, OTR/L Ignacia Palma, OTR/L [phone] 12/16/2012

## 2012-12-16 NOTE — Progress Notes (Signed)
FMTS Attending Admission Note: Catherine Harlin,MD I  have seen and examined this patient, reviewed their chart. I have discussed this patient with the resident. I agree with the resident's findings, assessment and care plan.  Patient doing very well today,denies any weakness,no slurring of her speech,denies headache.   Filed Vitals:   12/15/12 1830 12/15/12 2115 12/16/12 0119 12/16/12 0617  BP: 134/60 131/75 155/68 125/74  Pulse: 69 73 64 63  Temp: 97.6 F (36.4 C) 98 F (36.7 C) 98.1 F (36.7 C) 97.8 F (36.6 C)  TempSrc: Oral Oral Oral Oral  Resp: 20 18 20 18   Height:      Weight:      SpO2: 100% 95% 98% 96%   Exam: Gen: Awake and alert,not in distress. HEENT: AT/[place], PERRLA,EOMI. No Facial asymmetry. Resp: Air entry equal b/L. Heart: S1 S2,no murmurs. Neuro: Grossly intact,power 5/5 across all joint,steady and stable gait. Ext: No edema.  A/P: Dizziness with dysarthria: TIA episode. Symptoms resolved. MRI/MRA reviewed and discussed with patient. Continue ASA and Statin. May D/C home with close neurology follow up,if neurologist agree.

## 2012-12-16 NOTE — Discharge Summary (Signed)
FMTS Attending Admission Note: Kehinde Eniola,MD I  have seen and examined this patient, reviewed their chart. I have discussed this patient with the resident. I agree with the resident's findings, assessment and care plan.  

## 2012-12-16 NOTE — Progress Notes (Signed)
Stroke Team Progress Note  HISTORY Catherine [name] is an 67 y.o. female a history of arthritis and knee replacement as well as GERD, brought to the emergency room by EMS in code stroke status with slurred speech weakness and lightheadedness. She was last seen normal at 9:30 AM today. Has no previous history of stroke nor TIA. Patient has not been on antiplatelet therapy. CT scan of her head showed no acute intracranial abnormality. NIH stroke score was 0. Speech improved after arriving in the emergency room.  She was LKW at 0940, patient had dizziness, headache. Family called EMS and activate code stroke at 14.  Patient was not a TPA candidate secondary to out of window. She was admitted to for further evaluation and treatment.  SUBJECTIVE  No new symptoms.  OBJECTIVE Most recent Vital Signs: Filed Vitals:   12/15/12 1830 12/15/12 2115 12/16/12 0119 12/16/12 0617  BP: 134/60 131/75 155/68 125/74  Pulse: 69 73 64 63  Temp: 97.6 F (36.4 C) 98 F (36.7 C) 98.1 F (36.7 C) 97.8 F (36.6 C)  TempSrc: Oral Oral Oral Oral  Resp: 20 18 20 18   Height:      Weight:      SpO2: 100% 95% 98% 96%   CBG (last 3)   Recent Labs  12/14/12 2126  GLUCAP 152*    IV Fluid Intake:   . sodium chloride 75 mL/hr at 12/16/12 0300    MEDICATIONS  . aspirin EC  81 mg Oral Daily  . atorvastatin  40 mg Oral q1800  . heparin  5,000 Units Subcutaneous Q8H   PRN:  acetaminophen  Diet:  General thin liquids Activity:   Bathroom privileges with assistance DVT Prophylaxis:  Heparin SQ  CLINICALLY SIGNIFICANT STUDIES Basic Metabolic Panel:   Recent Labs Lab 12/14/12 1552 12/14/12 1603  NA 140 141  K 4.4 4.2  CL 103 104  CO2 29  --   GLUCOSE 126* 125*  BUN 14 15  CREATININE 0.69 0.90  CALCIUM 10.2  --    Liver Function Tests:   Recent Labs Lab 12/14/12 1552  AST 21  ALT 15  ALKPHOS 86  BILITOT 0.7  PROT 7.1  ALBUMIN 3.9   CBC:   Recent Labs Lab 12/14/12 1552  12/14/12 1603  WBC 6.4  --   NEUTROABS 4.2  --   HGB 13.6 13.9  HCT 39.4 41.0  MCV 87.2  --   PLT 280  --    Coagulation:   Recent Labs Lab 12/14/12 1552  LABPROT 12.1  INR 0.91   Cardiac Enzymes:   Recent Labs Lab 12/14/12 1552  TROPONINI <0.30   Urinalysis:   Recent Labs Lab 12/14/12 1642  COLORURINE YELLOW  LABSPEC 1.007  PHURINE 8.0  GLUCOSEU NEGATIVE  HGBUR NEGATIVE  BILIRUBINUR NEGATIVE  KETONESUR NEGATIVE  PROTEINUR NEGATIVE  UROBILINOGEN 0.2  NITRITE NEGATIVE  LEUKOCYTESUR TRACE*   Lipid Panel    Component Value Date/Time   CHOL 240* 12/15/2012 0740   LDL 130  HgbA1C  Lab Results  Component Value Date   HGBA1C 5.7* 12/14/2012    Urine Drug Screen:     Component Value Date/Time   LABOPIA NONE DETECTED 12/14/2012 1642   COCAINSCRNUR NONE DETECTED 12/14/2012 1642   LABBENZ NONE DETECTED 12/14/2012 1642   AMPHETMU NONE DETECTED 12/14/2012 1642   THCU NONE DETECTED 12/14/2012 1642   LABBARB NONE DETECTED 12/14/2012 1642    Alcohol Level:   Recent Labs Lab 12/14/12 1552  ETH <11  Ct Head Wo Contrast 12/14/2012 Stable and normal for age non contrast CT appearance of the brain.     MRI of the brain  No acute intracranial abnormality. Mild for age nonspecific signal changes.    MRA of the brain 5 mm segment of loss of flow signal in the left ACA at thecallosomarginal artery level. This might be artifact but appears to  be genuine, representing very high-grade stenosis or near occlusion of the vessel. However, there is preserved flow signal distal to  this lesion. Otherwise negative intracranial MRA.   2D Echocardiogram  EF 55%, wall motion normal, LA size normal    Carotid Doppler  Findings suggest 1-39% internal carotid artery stenosis bilaterally. Vertebral arteries are patent with antegrade flow.  CXR    EKG  normal sinus rhythm.   Therapy Recommendations home health  Physical Exam   Pleasant middle aged lady not in distress.Awake  alert. Afebrile. Head is nontraumatic. Neck is supple without bruit. Hearing is normal. Cardiac exam no murmur or gallop. Lungs are clear to auscultation. Distal pulses are well felt. Neurological Exam ;  Awake  Alert oriented x 3. Normal speech and language.eye movements full without nystagmus.fundi were not visualized. Vision acuity and fields appear normal. Hearing is normal. Palatal movements are normal. Face symmetric. Tongue midline. Normal strength, tone, reflexes and coordination. Normal sensation. Gait deferred.  ASSESSMENT Ms. Catherine [name] is a 67 y.o. female presenting with slurred speech, vertigo, headache and generalized weakness. Imaging confirms a no acute  infarct. Infarct felt to be embolic secondary to small vessel disease. These symptoms can also be seen with neck manipulation (vertebral)  On no antithrombotics prior to admission. Now on aspirin 81 mg orally every day for secondary stroke prevention. Work up underway.   Hyperlipidemia, LDL 130, goal < 120 in non diabetic patients, on statin therapy  GERD  Hospital day # 2  TREATMENT/PLAN  Continue aspirin 81 mg orally every day for secondary stroke prevention.  Risk factor modification  Have patient follow up with Dr. Pearlean Brownie in 2 months.  Gwendolyn Lima. Manson Passey, [facility], MBA, MHA [facility] Pager: [phone] 12/16/2012 10:35 AM  I have personally obtained a history, examined the patient, evaluated imaging results, and formulated the assessment and plan of care. I agree with the above.  Delia Heady, MD

## 2012-12-16 NOTE — Progress Notes (Signed)
Physical Therapy Treatment Patient Details Name: DEEANNA [name] MRN: [id number] DOB: [birth year] Today's Date: 12/16/2012 Time: 1478-2956 PT Time Calculation (min): 28 min  PT Assessment / Plan / Recommendation  History of Present Illness 67 y.o. year old female presenting with a probable TIA. PMH is significant for hyperlipidemia, rt knee replacement, arthritis, GERD, and cataract. Pt with new onset of dizziness and dysarthria   PT Comments   Presents to PT today back to patient reported baseline. Education provided to patient about sxs of CVA as well as benefits of regular aerobic exercise program. Discussed how to progress exercise program safely and the possibility of water aerobics if her arthritis gets too bad. No further PT needs at this time.   Follow Up Recommendations  No PT follow up     Does the patient have the potential to tolerate intense rehabilitation     Barriers to Discharge        Equipment Recommendations  None recommended by PT    Recommendations for Other Services    Frequency     Progress towards PT Goals Progress towards PT goals: Goals met/education completed, patient discharged from PT  Plan Discharge plan needs to be updated    Precautions / Restrictions Precautions Precautions: None Restrictions Weight Bearing Restrictions: No   Pertinent Vitals/Pain Denies pain    Mobility  Bed Mobility Bed Mobility: Not assessed Transfers Transfers: Sit to Stand;Stand to Sit Sit to Stand: 6: Modified independent (Device/Increase time) Ambulation/Gait Ambulation/Gait Assistance: 6: Modified independent (Device/Increase time) Ambulation Distance (Feet): 800 Feet Assistive device: None Ambulation/Gait Assistance Details: slow and antalgic due to leg length discrepancy and TKA in 2009 (reports this is baseline); no overt signs of unsteadiness Gait Pattern: Antalgic Stairs: Yes Stairs Assistance: 6: Modified independent (Device/Increase time) Stair Management  Technique: One rail Left;Alternating pattern Number of Stairs: 10 Modified Rankin (Stroke Patients Only) Pre-Morbid Rankin Score: No symptoms Modified Rankin: No symptoms      PT Goals (current goals can now be found in the care plan section)    Visit Information  Last PT Received On: 12/16/12 Assistance Needed: +1 History of Present Illness: 67 y.o. year old female presenting with a probable TIA. PMH is significant for hyperlipidemia, rt knee replacement, arthritis, GERD, and cataract. Pt with new onset of dizziness and dysarthria    Subjective Data      Cognition  Cognition Arousal/Alertness: Awake/alert Behavior During Therapy: WFL for tasks assessed/performed Overall Cognitive Status: Within Functional Limits for tasks assessed    Balance  Static Standing Balance Static Standing - Balance Support: No upper extremity supported Static Standing - Level of Assistance: 7: Independent Static Standing - Comment/# of Minutes: good proprioceptive awareness and ability to adjust for sway, no LOB Rhomberg - Eyes Opened: 60 Rhomberg - Eyes Closed: 60 High Level Balance High Level Balance Activites: Backward walking;Turns;Sudden stops;Head turns;Direction changes High Level Balance Comments: obstacle avoidance (figure 8 around glove boxes); no difficulty with any of these above activities, able to adjust speed accordingly (slow and fast); able to pick up objects from the floor with increased time due to pain in her sacrum  End of Session PT - End of Session Equipment Utilized During Treatment: Gait belt Activity Tolerance: Patient tolerated treatment well Patient left: in chair;with call bell/phone within reach Nurse Communication: Mobility status   GP     Ludger Nutting 12/16/2012, 9:16 AM

## 2013-02-15 ENCOUNTER — Encounter: Payer: Self-pay | Admitting: Nurse Practitioner

## 2013-02-15 ENCOUNTER — Ambulatory Visit (INDEPENDENT_AMBULATORY_CARE_PROVIDER_SITE_OTHER): Payer: BC Managed Care – PPO | Admitting: Nurse Practitioner

## 2013-02-15 VITALS — BP 150/73 | HR 83 | Temp 97.8°F | Ht 63.5 in | Wt 170.0 lb

## 2013-02-15 DIAGNOSIS — G459 Transient cerebral ischemic attack, unspecified: Secondary | ICD-10-CM

## 2013-02-15 DIAGNOSIS — E7800 Pure hypercholesterolemia, unspecified: Secondary | ICD-10-CM | POA: Insufficient documentation

## 2013-02-15 DIAGNOSIS — E785 Hyperlipidemia, unspecified: Secondary | ICD-10-CM

## 2013-02-15 NOTE — Progress Notes (Signed)
[facility]  PATIENT: Catherine [name] DOB: [birth year]   REASON FOR VISIT: follow up HISTORY FROM: patient  HISTORY OF PRESENT ILLNESS: Catherine [name] is an 67 y.o. female a history of arthritis and knee replacement as well as GERD, brought to the emergency room by EMS in code stroke status with slurred speech weakness and lightheadedness. She was last seen normal at 9:30 AM today. Has no previous history of stroke nor TIA. Patient has not been on antiplatelet therapy. CT scan of her head showed no acute intracranial abnormality. NIH stroke score was 0. Speech improved after arriving in the emergency room.  She was LKW at 0940, patient had dizziness, headache. Family called EMS and activate code stroke at 53.  Patient was not a TPA candidate secondary to out of window. She was admitted to for further evaluation and treatment.  UPDATE 02/15/13 (LL): Catherine [name] comes to the office for post-hospital follow up of TIA on 12/16/12.  BP is 150/73 in office today.  She was discharged on atorvastatin at hospital discharge, lipids were elevated,240 total cholesterol and LDL 130.  She discontinued atorvastatin due to muscle aches, she had the same with Crestor in the past.  She is now taking "Lipid Shield Plus", an herbal preparation, and CoQ10 daily.  She has follow up blood work with her PCP tomorrow morning.  She is doing well.  REVIEW OF SYSTEMS: Full 14 system review of systems performed and notable only for:  Constitutional: weight gain  Cardiovascular: palpitations  Ear/Nose/Throat: ringing in ears  Skin: moles Eyes: N/A  Respiratory: cough  Gastroitestinal: N/A  Genitourinary: N/A Hematology/Lymphatic: N/A  Endocrine: N/A Musculoskeletal: joint pain, joint swelling Allergy/Immunology: N/A  Neurological: headache at times, difficulty swallowing sometimes Psychiatric: decreased energy Sleep: N/A   ALLERGIES: No Known Allergies  HOME MEDICATIONS: Outpatient  Prescriptions Prior to Visit  Medication Sig Dispense Refill  . ALOE VERA PO Take 1 tablet by mouth 2 (two) times daily.      Marland Kitchen aspirin EC 81 MG EC tablet Take 1 tablet (81 mg total) by mouth daily.  30 tablet  11  . B Complex-C (B-COMPLEX WITH VITAMIN C) tablet Take 1 tablet by mouth daily.      Marland Kitchen BIOTIN PO Take 1 tablet by mouth daily.      . calcium carbonate (OS-CAL) 600 MG TABS Take 600 mg by mouth 2 (two) times daily with a meal.      . CINNAMON PO Take 1 tablet by mouth daily.      . clindamycin (CLEOCIN) 150 MG capsule Take 600 mg by mouth See admin instructions. Takes prior to dental appt      . Ginkgo Biloba (GINKGO PO) Take 1 tablet by mouth daily.      Marland Kitchen atorvastatin (LIPITOR) 40 MG tablet Take 1 tablet (40 mg total) by mouth daily at 6 PM.  30 tablet  11  . promethazine (PHENERGAN) 25 MG tablet Take 1 tablet (25 mg total) by mouth every 8 (eight) hours as needed for nausea (headache).  10 tablet  0   No facility-administered medications prior to visit.    PAST MEDICAL HISTORY: Past Medical History  Diagnosis Date  . Arthritis   . GERD (gastroesophageal reflux disease)   . Cataract     PAST SURGICAL HISTORY: Past Surgical History  Procedure Laterality Date  . Eye surgery      FAMILY HISTORY: Family History  Problem Relation Age of Onset  . Dementia Mother   .  Cancer Mother   . Cancer Father     SOCIAL HISTORY: History   Social History  . Marital Status: Married    Spouse Name: donald    Number of Children: 3  . Years of Education: college   Occupational History  . Not on file.   Social History Main Topics  . Smoking status: Never Smoker   . Smokeless tobacco: Not on file  . Alcohol Use: No  . Drug Use: No  . Sexual Activity: Yes   Other Topics Concern  . Not on file   Social History Narrative  . No narrative on file     PHYSICAL EXAM  Filed Vitals:   02/15/13 1434  BP: 150/73  Pulse: 83  Temp: 97.8 F (36.6 C)  TempSrc: Oral    Height: 5' 3.5" (1.613 m)  Weight: 170 lb (77.111 kg)   Body mass index is 29.64 kg/(m^2).  Generalized: Well developed, in no acute distress  Head: normocephalic and atraumatic. Oropharynx benign  Neck: Supple, no carotid bruits  Cardiac: Regular rate rhythm, no murmur  Musculoskeletal: No deformity   Neurological examination   Mentation: Alert oriented to time, place, history taking. Follows all commands speech and language fluent Cranial nerve II-XII: Fundoscopic exam reveals sharp disc margins.Pupils were equal round reactive to light extraocular movements were full, visual field were full on confrontational test. Facial sensation and strength were normal. hearing was intact to finger rubbing bilaterally. Uvula tongue midline. head turning and shoulder shrug and were normal and symmetric.Tongue protrusion into cheek strength was normal. Motor: normal bulk and tone, full strength in the BUE, BLE, fine finger movements normal, no pronator drift. No focal weakness Sensory: normal and symmetric to light touch, pinprick, and  vibration  Coordination: finger-nose-finger, heel-to-shin bilaterally, no dysmetria Reflexes: Brachioradialis 2/2, biceps 2/2, triceps 2/2, patellar 2/2, Achilles 2/2, plantar responses were flexor bilaterally. Gait and Station: Rising up from seated position without assistance, normal stance, without trunk ataxia, moderate stride, good arm swing, smooth turning, able to perform tiptoe, and heel walking without difficulty.   DIAGNOSTIC DATA (LABS, IMAGING, TESTING) - I reviewed patient records, labs, notes, testing and imaging myself where available.  Lab Results  Component Value Date   WBC 6.4 12/14/2012   HGB 13.9 12/14/2012   HCT 41.0 12/14/2012   MCV 87.2 12/14/2012   PLT 280 12/14/2012      Component Value Date/Time   NA 141 12/14/2012 1603   K 4.2 12/14/2012 1603   CL 104 12/14/2012 1603   CO2 29 12/14/2012 1552   GLUCOSE 125* 12/14/2012 1603   BUN 15  12/14/2012 1603   CREATININE 0.90 12/14/2012 1603   CREATININE 0.58 08/16/2012 1703   CALCIUM 10.2 12/14/2012 1552   PROT 7.1 12/14/2012 1552   ALBUMIN 3.9 12/14/2012 1552   AST 21 12/14/2012 1552   ALT 15 12/14/2012 1552   ALKPHOS 86 12/14/2012 1552   BILITOT 0.7 12/14/2012 1552   GFRNONAA 87* 12/14/2012 1552   GFRAA >90 12/14/2012 1552   Lab Results  Component Value Date   CHOL 240* 12/15/2012   HDL 43 12/15/2012   LDLCALC 130* 12/15/2012   TRIG 334* 12/15/2012   CHOLHDL 5.6 12/15/2012   Lab Results  Component Value Date   HGBA1C 5.7* 12/14/2012   Lab Results  Component Value Date   VITAMINB12 528 08/16/2012   Lab Results  Component Value Date   TSH 0.993 08/16/2012    Ct Head Wo Contrast  12/14/2012 Stable and  normal for age non contrast CT appearance of the brain.  MRI of the brain 12/15/12  No acute intracranial abnormality. Mild for age nonspecific signal changes.   MRA HEAD 12/15/12  5 mm segment of loss of flow signal in the left ACA at the callosomarginal artery level. This might be artifact but appears to be genuine, representing very high-grade stenosis or near occlusion of the vessel. However, there is preserved flow signal distal to this lesion.  Carotid Doppler 12/15/12  Findings suggest 1-39% internal carotid artery stenosis bilaterally. Vertebral arteries are patent with antegrade flow.   ASSESSMENT AND PLAN Catherine [name] is a 67 y.o. female presenting with slurred speech, vertigo, headache and generalized weakness.  Imaging confirms a no acute infarct. Infarct felt to be embolic secondary to small vessel disease.   PLAN: Continue aspirin 81 mg orally every day  for secondary stroke prevention and maintain strict control of hypertension with blood pressure goal below 130/90, and lipids with LDL cholesterol goal below 100 mg/dL.  Followup in the future with me in 6 months.  Ronal Fear, MSN, NP-C 02/15/2013, 3:25 PM [facility] [address], [address] [place], [place] [address] [phone]

## 2013-02-15 NOTE — Patient Instructions (Signed)
PLAN: Continue aspirin 81 mg orally every day  for secondary stroke prevention and maintain strict control of hypertension with blood pressure goal below 130/90, and lipids with LDL cholesterol goal below 100 mg/dL.  Followup in the future with me in 6 months.   STROKE/TIA INSTRUCTIONS SMOKING Cigarette smoking nearly doubles your risk of having a stroke & is the single most alterable risk factor  If you smoke or have smoked in the last 12 months, you are advised to quit smoking for your health.  Most of the excess cardiovascular risk related to smoking disappears within a year of stopping.  Ask you doctor about anti-smoking medications  [place] Quit Line: 1-800-QUIT NOW  Free Smoking Cessation Classes ([phone]  CHOLESTEROL Know your levels; limit fat & cholesterol in your diet  Lab Results  Component Value Date   CHOL 240* 12/15/2012   HDL 43 12/15/2012   LDLCALC 130* 12/15/2012   TRIG 334* 12/15/2012   CHOLHDL 5.6 12/15/2012      Many patients benefit from treatment even if their cholesterol is at goal.  Goal: Total Cholesterol less than 160  Goal:  LDL less than 100  Goal:  HDL greater than 40  Goal:  Triglycerides less than 150  BLOOD PRESSURE American Stroke Association blood pressure target is less that 120/80 mm/Hg  Your discharge blood pressure is:  BP: 150/73 mmHg  Monitor your blood pressure  Limit your salt and alcohol intake  Many individuals will require more than one medication for high blood pressure  DIABETES (A1c is a blood sugar average for last 3 months) Goal A1c is under 7% (A1c is blood sugar average for last 3 months)  Diabetes: No known diagnosis of diabetes    Lab Results  Component Value Date   HGBA1C 5.7* 12/14/2012    Your A1c can be lowered with medications, healthy diet, and exercise.  Check your blood sugar as directed by your physician  Call your physician if you experience unexplained or low blood sugars.  PHYSICAL  ACTIVITY/REHABILITATION Goal is 30 minutes at least 4 days per week    Activity decreases your risk of heart attack and stroke and makes your heart stronger.  It helps control your weight and blood pressure; helps you relax and can improve your mood.  Participate in a regular exercise program.  Talk with your doctor about the best form of exercise for you (dancing, walking, swimming, cycling).  DIET/WEIGHT Goal is to maintain a healthy weight  Your height is:  Height: 5' 3.5" (161.3 cm) Your current weight is: Weight: 170 lb (77.111 kg) Your body Mass Index (BMI) is:  BMI (Calculated): 29.7  Following the type of diet specifically designed for you will help prevent another stroke.  Your goal Body Mass Index (BMI) is 19-24.  Healthy food habits can help reduce 3 risk factors for stroke:  High cholesterol, hypertension, and excess weight.

## 2013-08-17 ENCOUNTER — Ambulatory Visit (INDEPENDENT_AMBULATORY_CARE_PROVIDER_SITE_OTHER): Payer: BC Managed Care – PPO | Admitting: Nurse Practitioner

## 2013-08-17 ENCOUNTER — Encounter: Payer: Self-pay | Admitting: Nurse Practitioner

## 2013-08-17 VITALS — BP 131/70 | HR 77 | Ht 62.0 in | Wt 170.0 lb

## 2013-08-17 DIAGNOSIS — G459 Transient cerebral ischemic attack, unspecified: Secondary | ICD-10-CM

## 2013-08-17 DIAGNOSIS — E669 Obesity, unspecified: Secondary | ICD-10-CM | POA: Insufficient documentation

## 2013-08-17 NOTE — Progress Notes (Signed)
PATIENT: Catherine [name] DOB: 02-21-1946  REASON FOR VISIT: follow up HISTORY FROM: patient  HISTORY OF PRESENT ILLNESS: Catherine [name] is an 68 y.o. female a history of arthritis and knee replacement as well as GERD, brought to the emergency room by EMS in code stroke status with slurred speech weakness and lightheadedness. She was last seen normal at 9:30 AM today. Has no previous history of stroke nor TIA. Patient has not been on antiplatelet therapy. CT scan of her head showed no acute intracranial abnormality. NIH stroke score was 0. Speech improved after arriving in the emergency room.  She was LKW at [place], patient had dizziness, headache. Family called EMS and activate code stroke at 65.  Patient was not a TPA candidate secondary to out of window. She was admitted to for further evaluation and treatment.   UPDATE 02/15/13 (LL): Catherine [name] comes to the office for post-hospital follow up of TIA on 12/16/12. BP is 150/73 in office today. She was discharged on atorvastatin at hospital discharge, lipids were elevated,240 total cholesterol and LDL 130. She discontinued atorvastatin due to muscle aches, she had the same with Crestor in the past. She is now taking "Lipid Shield Plus", an herbal preparation, and CoQ10 daily. She has follow up blood work with her PCP tomorrow morning. She is doing well.   08/17/13 (LL): Catherine [name] comes in for TIA revisit.  She has had no recurrent TIA or stroke symptoms.  She lost her husband last month suddenly to metastatic melanoma and has been under a lot of stress, but is coping well.  Her blood pressure is well controlled, it is 131/70 in office today.  She has upcoming las next week at her PCP.  She is tolerating daily aspirin well without significant bruising or bleeding.  REVIEW OF SYSTEMS: Full 14 system review of systems performed and notable only for:  Ringing in ears, constipation, swollen abdomen  ALLERGIES: No Known Allergies  HOME  MEDICATIONS: Outpatient Prescriptions Prior to Visit  Medication Sig Dispense Refill  . ALOE VERA PO Take 1 tablet by mouth 2 (two) times daily.      Marland Kitchen aspirin EC 81 MG EC tablet Take 1 tablet (81 mg total) by mouth daily.  30 tablet  11  . B Complex-C (B-COMPLEX WITH VITAMIN C) tablet Take 1 tablet by mouth daily.      Marland Kitchen BIOTIN PO Take 1 tablet by mouth daily.      . calcium carbonate (OS-CAL) 600 MG TABS Take 600 mg by mouth 2 (two) times daily with a meal.      . CINNAMON PO Take 1 tablet by mouth daily.      . clindamycin (CLEOCIN) 150 MG capsule Take 600 mg by mouth See admin instructions. Takes prior to dental appt      . Coenzyme Q10 (COQ-10) 200 MG CAPS Take 200 mg by mouth daily.      Marland Kitchen FLUZONE HIGH-DOSE injection       . Ginkgo Biloba (GINKGO PO) Take 1 tablet by mouth daily. Take 60 mg daily       No facility-administered medications prior to visit.     PHYSICAL EXAM  Filed Vitals:   08/17/13 1316  BP: 131/70  Pulse: 77  Height: 5\' 2"  (1.575 m)  Weight: 170 lb (77.111 kg)   Body mass index is 31.09 kg/(m^2).  Generalized: Well developed, in no acute distress  Head: normocephalic and atraumatic. Oropharynx benign  Neck: Supple, no carotid  bruits  Cardiac: Regular rate rhythm, no murmur  Musculoskeletal: No deformity   Neurological examination  Mentation: Alert oriented to time, place, history taking. Follows all commands speech and language fluent  Cranial nerve II-XII: Fundoscopic exam reveals sharp disc margins.Pupils were equal round reactive to light extraocular movements were full, visual field were full on confrontational test. Facial sensation and strength were normal. hearing was intact to finger rubbing bilaterally. Uvula tongue midline. head turning and shoulder shrug and were normal and symmetric.Tongue protrusion into cheek strength was normal.  Motor: normal bulk and tone, full strength in the BUE, BLE, fine finger movements normal, no pronator drift. No  focal weakness  Sensory: normal and symmetric to light touch, pinprick, and vibration  Coordination: finger-nose-finger, heel-to-shin bilaterally, no dysmetria  Reflexes: Brachioradialis 2/2, biceps 2/2, triceps 2/2, patellar 2/2, Achilles 2/2, plantar responses were flexor bilaterally.  Gait and Station: Rising up from seated position without assistance, normal stance, without trunk ataxia, moderate stride, good arm swing, smooth turning, able to perform tiptoe, and heel walking without difficulty.   Carotid Doppler 12/15/12  Findings suggest 1-39% internal carotid artery stenosis bilaterally. Vertebral arteries are patent with antegrade flow.   ASSESSMENT AND PLAN Catherine [name] is a 68 y.o. female presenting with slurred speech, vertigo, headache and generalized weakness. Imaging confirms a no acute infarct. Infarct felt to be secondary to small vessel disease.   PLAN:  Continue aspirin 81 mg orally every day for secondary stroke prevention and maintain strict control of hypertension with blood pressure goal below 130/90, and lipids with LDL cholesterol goal below 100 mg/dL.  Followup in the future in 1 year, sooner as needed.  Philmore Pali, MSN, NP-C 08/17/2013, 1:27 PM [facility] [address], [place], [place] [address] [phone]  Note: This document was prepared with digital dictation and possible smart phrase technology. Any transcriptional errors that result from this process are unintentional.

## 2013-08-17 NOTE — Patient Instructions (Signed)
  Continue aspirin 81 mg orally every day for secondary stroke prevention and maintain strict control of hypertension with blood pressure goal below 130/90, and lipids with LDL cholesterol goal below 100 mg/dL.  Followup in the future in 6 months, sooner as needed.

## 2013-11-21 ENCOUNTER — Ambulatory Visit
Admission: RE | Admit: 2013-11-21 | Discharge: 2013-11-21 | Disposition: A | Discharge status: Home / Self Care | Payer: BC Managed Care – PPO | Source: Ambulatory Visit | Attending: Family Medicine | Admitting: Family Medicine

## 2013-11-21 ENCOUNTER — Other Ambulatory Visit: Payer: Self-pay | Admitting: Family Medicine

## 2013-11-21 DIAGNOSIS — R0602 Shortness of breath: Secondary | ICD-10-CM

## 2014-03-27 DIAGNOSIS — Z23 Encounter for immunization: Secondary | ICD-10-CM | POA: Diagnosis not present

## 2014-05-23 DIAGNOSIS — R05 Cough: Secondary | ICD-10-CM | POA: Diagnosis not present

## 2014-05-23 DIAGNOSIS — R21 Rash and other nonspecific skin eruption: Secondary | ICD-10-CM | POA: Diagnosis not present

## 2014-06-27 DIAGNOSIS — R920 Mammographic microcalcification found on diagnostic imaging of breast: Secondary | ICD-10-CM | POA: Diagnosis not present

## 2014-06-27 DIAGNOSIS — Z803 Family history of malignant neoplasm of breast: Secondary | ICD-10-CM | POA: Diagnosis not present

## 2014-07-13 DIAGNOSIS — E785 Hyperlipidemia, unspecified: Secondary | ICD-10-CM | POA: Diagnosis not present

## 2014-07-13 DIAGNOSIS — Z1389 Encounter for screening for other disorder: Secondary | ICD-10-CM | POA: Diagnosis not present

## 2014-07-26 DIAGNOSIS — L814 Other melanin hyperpigmentation: Secondary | ICD-10-CM | POA: Diagnosis not present

## 2014-07-26 DIAGNOSIS — L821 Other seborrheic keratosis: Secondary | ICD-10-CM | POA: Diagnosis not present

## 2014-07-26 DIAGNOSIS — D225 Melanocytic nevi of trunk: Secondary | ICD-10-CM | POA: Diagnosis not present

## 2014-07-26 DIAGNOSIS — D1801 Hemangioma of skin and subcutaneous tissue: Secondary | ICD-10-CM | POA: Diagnosis not present

## 2014-08-14 ENCOUNTER — Ambulatory Visit (INDEPENDENT_AMBULATORY_CARE_PROVIDER_SITE_OTHER): Payer: Medicare Other | Admitting: Neurology

## 2014-08-14 ENCOUNTER — Encounter: Payer: Self-pay | Admitting: Neurology

## 2014-08-14 VITALS — BP 127/70 | HR 76 | Ht 63.0 in | Wt 177.0 lb

## 2014-08-14 DIAGNOSIS — G450 Vertebro-basilar artery syndrome: Secondary | ICD-10-CM

## 2014-08-14 DIAGNOSIS — I10 Essential (primary) hypertension: Secondary | ICD-10-CM | POA: Diagnosis not present

## 2014-08-14 DIAGNOSIS — E785 Hyperlipidemia, unspecified: Secondary | ICD-10-CM | POA: Diagnosis not present

## 2014-08-14 NOTE — Patient Instructions (Signed)
-   continue ASA 81mg  and simvastatin for stroke prevention - Follow up with your primary care physician for stroke risk factor modification. Recommend maintain blood pressure goal <130/80, diabetes with hemoglobin A1c goal below 6.5% and lipids with LDL cholesterol goal below 70 mg/dL. - continue to do what you are doing - follow up as needed.

## 2014-08-14 NOTE — Progress Notes (Signed)
STROKE NEUROLOGY FOLLOW UP NOTE  NAME: Taygen Newsome Masten DOB: 1945-09-19  REASON FOR VISIT: stroke follow up HISTORY FROM: pt and chart  Today we had the pleasure of seeing KESHAYLA SCHRUM in follow-up at our Neurology Clinic. Pt was accompanied by no one.   History Summary JAIDENCE GEISLER is an 69 y.o. female a history of arthritis and knee replacement as well as GERD, brought to the emergency room by EMS in code stroke status with slurred speech and lightheadedness on 12/16/12. Has no previous history of stroke nor TIA. Patient has been on ASA 81mg  therapy. CT scan of her head showed no acute intracranial abnormality. NIH stroke score was 0. Speech improved after arriving in the emergency room. MRI negative for stroke and MRA showed likely left ACA stenosis. Lipids were elevated, 240 total cholesterol and LDL 130.She was diagnosed with TIA and discharged with ASA 81 and lipitor   UPDATE 02/15/13 (LL): Ms. Vanderhoof comes to the office for post-hospital follow up of TIA on 12/16/12. BP is 150/73 in office today. She discontinued atorvastatin due to muscle aches, she had the same with Crestor in the past. She is now taking "Lipid Shield Plus", an herbal preparation, and CoQ10 daily. She has follow up blood work with her PCP. She is doing well.   08/17/13 (LL): Ms. Winker comes in for TIA revisit. She has had no recurrent TIA or stroke symptoms. She lost her husband last month suddenly to metastatic melanoma and has been under a lot of stress, but is coping well. Her blood pressure is well controlled, it is 131/70 in office today. She has upcoming visit with her PCP. She is tolerating daily aspirin well without significant bruising or bleeding.  Interval History During the interval time, the patient has been doing well. No recurrent symptoms. She just had blood work with her PCP and seems all good as she did not get any calls from her PCP yet. She is still on ASA 81mg  and currently on simvastatin  and tolerating well. Her BP today 127/70.    REVIEW OF SYSTEMS: Full 14 system review of systems performed and notable only for those listed below and in HPI above, all others are negative:  Constitutional:   Cardiovascular:  Ear/Nose/Throat:   Skin: rash Eyes:   Respiratory:   Gastroitestinal:   Genitourinary:  Hematology/Lymphatic:   Endocrine:  Musculoskeletal:   Allergy/Immunology:   Neurological:   Psychiatric:  Sleep: snoring  The following represents the patient's updated allergies and side effects list: No Known Allergies  The neurologically relevant items on the patient's problem list were reviewed on today's visit.  Neurologic Examination  A problem focused neurological exam (12 or more points of the single system neurologic examination, vital signs counts as 1 point, cranial nerves count for 8 points) was performed.  Blood pressure 127/70, pulse 76, height 5\' 3"  (1.6 m), weight 177 lb (80.287 kg).  General - Well nourished, well developed, in no apparent distress.  Ophthalmologic - Sharp disc margins OU.  Cardiovascular - Regular rate and rhythm with no murmur.  Mental Status -  Level of arousal and orientation to time, place, and person were intact. Language including expression, naming, repetition, comprehension, reading, and writing was assessed and found intact. Fund of Knowledge was assessed and was intact.  Cranial Nerves II - XII - II - Visual field intact OU. III, IV, VI - Extraocular movements intact. V - Facial sensation intact bilaterally. VII - Facial movement intact bilaterally.  VIII - Hearing & vestibular intact bilaterally. X - Palate elevates symmetrically. XI - Chin turning & shoulder shrug intact bilaterally. XII - Tongue protrusion intact.  Motor Strength - The patient's strength was normal in all extremities and pronator drift was absent.  Bulk was normal and fasciculations were absent.   Motor Tone - Muscle tone was assessed at the  neck and appendages and was normal.  Reflexes - The patient's reflexes were normal in all extremities and she had no pathological reflexes.  Sensory - Light touch, temperature/pinprick, vibration and proprioception, and Romberg testing were assessed and were normal.    Coordination - The patient had normal movements in the hands and feet with no ataxia or dysmetria.  Tremor was absent.  Gait and Station - The patient's transfers, posture, gait, station, and turns were observed as normal.  Data reviewed: I personally reviewed the images and agree with the radiology interpretations.  MRI and MRA: 1. No acute intracranial abnormality. Mild for age nonspecific signal changes. See MRA findings and impression. 2. 5 mm segment of loss of flow signal in the left ACA at the callosomarginal artery level. This might be artifact but appears to be genuine, representing very high-grade stenosis or near occlusion of the vessel. However, there is preserved flow signal distal to this lesion. 3. Otherwise negative intracranial MRA.  CT head - IMPRESSION: Stable and normal for age non contrast CT appearance of the brain.  CUS - Bilateral: 1-39% ICA stenosis. Vertebral artery flow is antegrade.  2D echo - - Left ventricle: The cavity size was normal. Wall thickness was normal. Systolic function was normal. The estimated ejection fraction was in the range of 55% to 60%. Wall motion was normal; there were no regional wall motion abnormalities. Doppler parameters are consistent with abnormal left ventricular relaxation (grade 1 diastolic dysfunction). - Aortic valve: Trivial regurgitation. - Pulmonary arteries: Systolic pressure was mildly increased. PA peak pressure: 68mm Hg (S).  Component     Latest Ref Rng 12/14/2012 12/15/2012  Cholesterol     0 - 200 mg/dL  240 (H)  Triglycerides     <150 mg/dL  334 (H)  HDL Cholesterol     >39 mg/dL  43  Total CHOL/HDL Ratio       5.6    VLDL     0 - 40 mg/dL  67 (H)  LDL (calc)     0 - 99 mg/dL  130 (H)  Hemoglobin A1C     <5.7 % 5.7 (H)   Mean Plasma Glucose     <117 mg/dL 117 (H)     Assessment: As you may recall, she is a 69 y.o. Caucasian female with PMH of OA and GERD was admitted on 12/16/12 for TIA episode. MRI negative and stroke w/u largely negative except LDL 130. She was put on lipitor but not tolerating. Currently on simvastatin. Continued on ASA 81mg . She is doing well without recurrent symptoms. Follows with PCP regularly for stroke risk factor modification.  Plan:  - continue ASA 81mg  and simvastatin for stroke prevention - Follow up with your primary care physician for stroke risk factor modification. Recommend maintain blood pressure goal <130/80, diabetes with hemoglobin A1c goal below 6.5% and lipids with LDL cholesterol goal below 70 mg/dL. - continue to do what you are doing - RTC as needed.  No orders of the defined types were placed in this encounter.    Meds ordered this encounter  Medications  . PROAIR HFA 108 (90  BASE) MCG/ACT inhaler    Sig: Inhale 1-2 puffs into the lungs as needed.     Patient Instructions  - continue ASA 81mg  and simvastatin for stroke prevention - Follow up with your primary care physician for stroke risk factor modification. Recommend maintain blood pressure goal <130/80, diabetes with hemoglobin A1c goal below 6.5% and lipids with LDL cholesterol goal below 70 mg/dL. - continue to do what you are doing - follow up as needed.    Rosalin Hawking, MD PhD [facility] [address], [place], [place] [address] [phone]

## 2014-08-21 ENCOUNTER — Ambulatory Visit: Payer: BC Managed Care – PPO | Admitting: Neurology

## 2014-09-11 DIAGNOSIS — H04123 Dry eye syndrome of bilateral lacrimal glands: Secondary | ICD-10-CM | POA: Diagnosis not present

## 2014-09-11 DIAGNOSIS — H26491 Other secondary cataract, right eye: Secondary | ICD-10-CM | POA: Diagnosis not present

## 2014-09-11 DIAGNOSIS — H10413 Chronic giant papillary conjunctivitis, bilateral: Secondary | ICD-10-CM | POA: Diagnosis not present

## 2014-09-11 DIAGNOSIS — Z961 Presence of intraocular lens: Secondary | ICD-10-CM | POA: Diagnosis not present

## 2014-09-20 DIAGNOSIS — L821 Other seborrheic keratosis: Secondary | ICD-10-CM | POA: Diagnosis not present

## 2014-09-20 DIAGNOSIS — L309 Dermatitis, unspecified: Secondary | ICD-10-CM | POA: Diagnosis not present

## 2014-11-02 DIAGNOSIS — R05 Cough: Secondary | ICD-10-CM | POA: Diagnosis not present

## 2014-11-02 DIAGNOSIS — R0789 Other chest pain: Secondary | ICD-10-CM | POA: Diagnosis not present

## 2014-11-29 DIAGNOSIS — L219 Seborrheic dermatitis, unspecified: Secondary | ICD-10-CM | POA: Diagnosis not present

## 2015-01-23 DIAGNOSIS — R921 Mammographic calcification found on diagnostic imaging of breast: Secondary | ICD-10-CM | POA: Diagnosis not present

## 2015-02-05 DIAGNOSIS — M85851 Other specified disorders of bone density and structure, right thigh: Secondary | ICD-10-CM | POA: Diagnosis not present

## 2015-02-18 DIAGNOSIS — Z01419 Encounter for gynecological examination (general) (routine) without abnormal findings: Secondary | ICD-10-CM | POA: Diagnosis not present

## 2015-02-18 DIAGNOSIS — Z124 Encounter for screening for malignant neoplasm of cervix: Secondary | ICD-10-CM | POA: Diagnosis not present

## 2015-02-18 DIAGNOSIS — Z23 Encounter for immunization: Secondary | ICD-10-CM | POA: Diagnosis not present

## 2015-02-18 DIAGNOSIS — R87615 Unsatisfactory cytologic smear of cervix: Secondary | ICD-10-CM | POA: Diagnosis not present

## 2015-03-15 DIAGNOSIS — L719 Rosacea, unspecified: Secondary | ICD-10-CM | POA: Diagnosis not present

## 2015-03-15 DIAGNOSIS — L309 Dermatitis, unspecified: Secondary | ICD-10-CM | POA: Diagnosis not present

## 2015-03-29 DIAGNOSIS — B9689 Other specified bacterial agents as the cause of diseases classified elsewhere: Secondary | ICD-10-CM | POA: Diagnosis not present

## 2015-03-29 DIAGNOSIS — L0202 Furuncle of face: Secondary | ICD-10-CM | POA: Diagnosis not present

## 2015-04-25 ENCOUNTER — Other Ambulatory Visit: Payer: Self-pay | Admitting: Physician Assistant

## 2015-04-25 ENCOUNTER — Ambulatory Visit
Admission: RE | Admit: 2015-04-25 | Discharge: 2015-04-25 | Disposition: A | Discharge status: Home / Self Care | Payer: Medicare Other | Source: Ambulatory Visit | Attending: Physician Assistant | Admitting: Physician Assistant

## 2015-04-25 DIAGNOSIS — S6991XA Unspecified injury of right wrist, hand and finger(s), initial encounter: Secondary | ICD-10-CM

## 2015-04-25 DIAGNOSIS — S62634A Displaced fracture of distal phalanx of right ring finger, initial encounter for closed fracture: Secondary | ICD-10-CM | POA: Diagnosis not present

## 2015-04-26 DIAGNOSIS — M7989 Other specified soft tissue disorders: Secondary | ICD-10-CM | POA: Diagnosis not present

## 2015-04-26 DIAGNOSIS — S62634A Displaced fracture of distal phalanx of right ring finger, initial encounter for closed fracture: Secondary | ICD-10-CM | POA: Diagnosis not present

## 2015-05-02 DIAGNOSIS — L0202 Furuncle of face: Secondary | ICD-10-CM | POA: Diagnosis not present

## 2015-05-02 DIAGNOSIS — B9689 Other specified bacterial agents as the cause of diseases classified elsewhere: Secondary | ICD-10-CM | POA: Diagnosis not present

## 2015-05-10 DIAGNOSIS — S62634D Displaced fracture of distal phalanx of right ring finger, subsequent encounter for fracture with routine healing: Secondary | ICD-10-CM | POA: Diagnosis not present

## 2015-05-31 DIAGNOSIS — S62634D Displaced fracture of distal phalanx of right ring finger, subsequent encounter for fracture with routine healing: Secondary | ICD-10-CM | POA: Diagnosis not present

## 2015-06-10 DIAGNOSIS — J209 Acute bronchitis, unspecified: Secondary | ICD-10-CM | POA: Diagnosis not present

## 2015-10-03 DIAGNOSIS — M545 Low back pain: Secondary | ICD-10-CM | POA: Diagnosis not present

## 2015-10-03 DIAGNOSIS — M1711 Unilateral primary osteoarthritis, right knee: Secondary | ICD-10-CM | POA: Diagnosis not present

## 2015-12-03 DIAGNOSIS — Z8673 Personal history of transient ischemic attack (TIA), and cerebral infarction without residual deficits: Secondary | ICD-10-CM | POA: Diagnosis not present

## 2015-12-03 DIAGNOSIS — E6609 Other obesity due to excess calories: Secondary | ICD-10-CM | POA: Diagnosis not present

## 2015-12-03 DIAGNOSIS — Z23 Encounter for immunization: Secondary | ICD-10-CM | POA: Diagnosis not present

## 2015-12-03 DIAGNOSIS — E785 Hyperlipidemia, unspecified: Secondary | ICD-10-CM | POA: Diagnosis not present

## 2015-12-03 DIAGNOSIS — Z1389 Encounter for screening for other disorder: Secondary | ICD-10-CM | POA: Diagnosis not present

## 2015-12-03 DIAGNOSIS — R05 Cough: Secondary | ICD-10-CM | POA: Diagnosis not present

## 2015-12-03 DIAGNOSIS — J309 Allergic rhinitis, unspecified: Secondary | ICD-10-CM | POA: Diagnosis not present

## 2016-02-24 DIAGNOSIS — J069 Acute upper respiratory infection, unspecified: Secondary | ICD-10-CM | POA: Diagnosis not present

## 2016-02-27 DIAGNOSIS — N644 Mastodynia: Secondary | ICD-10-CM | POA: Diagnosis not present

## 2016-02-27 DIAGNOSIS — Z1239 Encounter for other screening for malignant neoplasm of breast: Secondary | ICD-10-CM | POA: Diagnosis not present

## 2016-03-31 DIAGNOSIS — J069 Acute upper respiratory infection, unspecified: Secondary | ICD-10-CM | POA: Diagnosis not present

## 2016-03-31 DIAGNOSIS — R1011 Right upper quadrant pain: Secondary | ICD-10-CM | POA: Diagnosis not present

## 2016-03-31 DIAGNOSIS — R0602 Shortness of breath: Secondary | ICD-10-CM | POA: Diagnosis not present

## 2016-04-15 DIAGNOSIS — R1011 Right upper quadrant pain: Secondary | ICD-10-CM | POA: Diagnosis not present

## 2016-04-28 DIAGNOSIS — J209 Acute bronchitis, unspecified: Secondary | ICD-10-CM | POA: Diagnosis not present

## 2016-04-28 DIAGNOSIS — Z23 Encounter for immunization: Secondary | ICD-10-CM | POA: Diagnosis not present

## 2016-05-05 ENCOUNTER — Other Ambulatory Visit: Payer: Self-pay | Admitting: Gastroenterology

## 2016-05-05 DIAGNOSIS — R194 Change in bowel habit: Secondary | ICD-10-CM | POA: Diagnosis not present

## 2016-05-05 DIAGNOSIS — R1011 Right upper quadrant pain: Secondary | ICD-10-CM | POA: Diagnosis not present

## 2016-05-05 DIAGNOSIS — R140 Abdominal distension (gaseous): Secondary | ICD-10-CM | POA: Diagnosis not present

## 2016-05-05 DIAGNOSIS — Z86010 Personal history of colonic polyps: Secondary | ICD-10-CM | POA: Diagnosis not present

## 2016-05-26 ENCOUNTER — Encounter (HOSPITAL_COMMUNITY): Payer: Medicare Other

## 2016-05-26 ENCOUNTER — Ambulatory Visit (HOSPITAL_COMMUNITY)
Admission: RE | Admit: 2016-05-26 | Discharge: 2016-05-26 | Disposition: A | Discharge status: Home / Self Care | Payer: Medicare Other | Source: Ambulatory Visit | Attending: Gastroenterology | Admitting: Gastroenterology

## 2016-05-26 ENCOUNTER — Encounter (HOSPITAL_COMMUNITY)
Admission: RE | Admit: 2016-05-26 | Discharge: 2016-05-26 | Disposition: A | Discharge status: Home / Self Care | Payer: Medicare Other | Source: Ambulatory Visit | Attending: Gastroenterology | Admitting: Gastroenterology

## 2016-05-26 DIAGNOSIS — R1011 Right upper quadrant pain: Secondary | ICD-10-CM | POA: Diagnosis not present

## 2016-05-26 MED ORDER — TECHNETIUM TC 99M MEBROFENIN IV KIT
5.0000 | PACK | Freq: Once | INTRAVENOUS | Status: AC | PRN
  Administered 2016-05-26: 5 via INTRAVENOUS

## 2016-07-19 ENCOUNTER — Encounter (HOSPITAL_COMMUNITY): Payer: Self-pay | Admitting: Emergency Medicine

## 2016-07-19 ENCOUNTER — Emergency Department (HOSPITAL_COMMUNITY): Payer: Medicare Other

## 2016-07-19 ENCOUNTER — Inpatient Hospital Stay (HOSPITAL_COMMUNITY): Payer: Medicare Other

## 2016-07-19 ENCOUNTER — Inpatient Hospital Stay (HOSPITAL_COMMUNITY)
Admission: EM | Admit: 2016-07-19 | Discharge: 2016-07-21 | DRG: 065 | Disposition: A | Discharge status: Rehabilitation Facility | Payer: Medicare Other | Attending: Internal Medicine | Admitting: Internal Medicine

## 2016-07-19 DIAGNOSIS — R030 Elevated blood-pressure reading, without diagnosis of hypertension: Secondary | ICD-10-CM | POA: Diagnosis not present

## 2016-07-19 DIAGNOSIS — I69392 Facial weakness following cerebral infarction: Secondary | ICD-10-CM | POA: Diagnosis not present

## 2016-07-19 DIAGNOSIS — G4700 Insomnia, unspecified: Secondary | ICD-10-CM | POA: Diagnosis present

## 2016-07-19 DIAGNOSIS — Z8673 Personal history of transient ischemic attack (TIA), and cerebral infarction without residual deficits: Secondary | ICD-10-CM | POA: Diagnosis not present

## 2016-07-19 DIAGNOSIS — I63311 Cerebral infarction due to thrombosis of right middle cerebral artery: Secondary | ICD-10-CM | POA: Diagnosis not present

## 2016-07-19 DIAGNOSIS — Z79899 Other long term (current) drug therapy: Secondary | ICD-10-CM

## 2016-07-19 DIAGNOSIS — I69391 Dysphagia following cerebral infarction: Secondary | ICD-10-CM | POA: Diagnosis not present

## 2016-07-19 DIAGNOSIS — I739 Peripheral vascular disease, unspecified: Secondary | ICD-10-CM | POA: Diagnosis present

## 2016-07-19 DIAGNOSIS — Z7982 Long term (current) use of aspirin: Secondary | ICD-10-CM | POA: Diagnosis not present

## 2016-07-19 DIAGNOSIS — I69354 Hemiplegia and hemiparesis following cerebral infarction affecting left non-dominant side: Secondary | ICD-10-CM | POA: Diagnosis present

## 2016-07-19 DIAGNOSIS — G459 Transient cerebral ischemic attack, unspecified: Secondary | ICD-10-CM | POA: Diagnosis not present

## 2016-07-19 DIAGNOSIS — R200 Anesthesia of skin: Secondary | ICD-10-CM

## 2016-07-19 DIAGNOSIS — E785 Hyperlipidemia, unspecified: Secondary | ICD-10-CM | POA: Diagnosis not present

## 2016-07-19 DIAGNOSIS — Z823 Family history of stroke: Secondary | ICD-10-CM | POA: Diagnosis not present

## 2016-07-19 DIAGNOSIS — I10 Essential (primary) hypertension: Secondary | ICD-10-CM | POA: Diagnosis not present

## 2016-07-19 DIAGNOSIS — I459 Conduction disorder, unspecified: Secondary | ICD-10-CM | POA: Diagnosis present

## 2016-07-19 DIAGNOSIS — I639 Cerebral infarction, unspecified: Principal | ICD-10-CM

## 2016-07-19 DIAGNOSIS — G8194 Hemiplegia, unspecified affecting left nondominant side: Secondary | ICD-10-CM

## 2016-07-19 DIAGNOSIS — R4702 Dysphasia: Secondary | ICD-10-CM

## 2016-07-19 DIAGNOSIS — R1310 Dysphagia, unspecified: Secondary | ICD-10-CM | POA: Diagnosis present

## 2016-07-19 DIAGNOSIS — M1990 Unspecified osteoarthritis, unspecified site: Secondary | ICD-10-CM | POA: Diagnosis present

## 2016-07-19 DIAGNOSIS — R29810 Facial weakness: Secondary | ICD-10-CM | POA: Diagnosis present

## 2016-07-19 DIAGNOSIS — I69322 Dysarthria following cerebral infarction: Secondary | ICD-10-CM | POA: Diagnosis not present

## 2016-07-19 DIAGNOSIS — I669 Occlusion and stenosis of unspecified cerebral artery: Secondary | ICD-10-CM | POA: Diagnosis not present

## 2016-07-19 DIAGNOSIS — R270 Ataxia, unspecified: Secondary | ICD-10-CM | POA: Diagnosis present

## 2016-07-19 DIAGNOSIS — K219 Gastro-esophageal reflux disease without esophagitis: Secondary | ICD-10-CM | POA: Diagnosis present

## 2016-07-19 DIAGNOSIS — R21 Rash and other nonspecific skin eruption: Secondary | ICD-10-CM | POA: Diagnosis not present

## 2016-07-19 DIAGNOSIS — G458 Other transient cerebral ischemic attacks and related syndromes: Secondary | ICD-10-CM | POA: Diagnosis not present

## 2016-07-19 DIAGNOSIS — E7800 Pure hypercholesterolemia, unspecified: Secondary | ICD-10-CM | POA: Diagnosis not present

## 2016-07-19 DIAGNOSIS — I6789 Other cerebrovascular disease: Secondary | ICD-10-CM | POA: Diagnosis not present

## 2016-07-19 DIAGNOSIS — R531 Weakness: Secondary | ICD-10-CM | POA: Diagnosis not present

## 2016-07-19 DIAGNOSIS — G8191 Hemiplegia, unspecified affecting right dominant side: Secondary | ICD-10-CM | POA: Diagnosis not present

## 2016-07-19 DIAGNOSIS — I6300 Cerebral infarction due to thrombosis of unspecified precerebral artery: Secondary | ICD-10-CM | POA: Diagnosis not present

## 2016-07-19 HISTORY — DX: Transient cerebral ischemic attack, unspecified: G45.9

## 2016-07-19 LAB — PROTIME-INR
INR: 1
PROTHROMBIN TIME: 13.3 s (ref 11.4–15.2)

## 2016-07-19 LAB — COMPREHENSIVE METABOLIC PANEL
ALBUMIN: 4.2 g/dL (ref 3.5–5.0)
ALK PHOS: 83 U/L (ref 38–126)
ALT: 16 U/L (ref 14–54)
AST: 22 U/L (ref 15–41)
Anion gap: 10 (ref 5–15)
BUN: 9 mg/dL (ref 6–20)
CO2: 27 mmol/L (ref 22–32)
Calcium: 9.7 mg/dL (ref 8.9–10.3)
Chloride: 103 mmol/L (ref 101–111)
Creatinine, Ser: 0.78 mg/dL (ref 0.44–1.00)
GFR calc Af Amer: >60 mL/min (ref >60)
GLUCOSE: 111 mg/dL — AB (ref 65–99)
Potassium: 3.8 mmol/L (ref 3.5–5.1)
SODIUM: 140 mmol/L (ref 135–145)
TOTAL PROTEIN: 7.3 g/dL (ref 6.5–8.1)
Total Bilirubin: 1.4 mg/dL — ABNORMAL HIGH (ref 0.3–1.2)

## 2016-07-19 LAB — CBC
HCT: 42 % (ref 36.0–46.0)
Hemoglobin: 14 g/dL (ref 12.0–15.0)
MCH: 29.7 pg (ref 26.0–34.0)
MCHC: 33.3 g/dL (ref 30.0–36.0)
MCV: 89 fL (ref 78.0–100.0)
PLATELETS: 266 10*3/uL (ref 150–400)
RBC: 4.72 MIL/uL (ref 3.87–5.11)
RDW: 13.6 % (ref 11.5–15.5)
WBC: 6 10*3/uL (ref 4.0–10.5)

## 2016-07-19 LAB — DIFFERENTIAL
BASOS ABS: 0 10*3/uL (ref 0.0–0.1)
Basophils Relative: 0 %
Eosinophils Absolute: 0.1 10*3/uL (ref 0.0–0.7)
Eosinophils Relative: 2 %
Lymphocytes Relative: 36 %
Lymphs Abs: 2.1 10*3/uL (ref 0.7–4.0)
Monocytes Absolute: 0.5 10*3/uL (ref 0.1–1.0)
Monocytes Relative: 9 %
NEUTROS ABS: 3.2 10*3/uL (ref 1.7–7.7)
NEUTROS PCT: 53 %

## 2016-07-19 LAB — APTT: APTT: 32 s (ref 24–36)

## 2016-07-19 LAB — I-STAT CHEM 8, ED
BUN: 11 mg/dL (ref 6–20)
CALCIUM ION: 1.12 mmol/L — AB (ref 1.15–1.40)
CHLORIDE: 101 mmol/L (ref 101–111)
CREATININE: 0.8 mg/dL (ref 0.44–1.00)
GLUCOSE: 113 mg/dL — AB (ref 65–99)
HCT: 41 % (ref 36.0–46.0)
Hemoglobin: 13.9 g/dL (ref 12.0–15.0)
Potassium: 3.9 mmol/L (ref 3.5–5.1)
SODIUM: 141 mmol/L (ref 135–145)
TCO2: 29 mmol/L (ref 0–100)

## 2016-07-19 LAB — I-STAT TROPONIN, ED: TROPONIN I, POC: 0 ng/mL (ref 0.00–0.08)

## 2016-07-19 LAB — CBG MONITORING, ED: Glucose-Capillary: 129 mg/dL — ABNORMAL HIGH (ref 65–99)

## 2016-07-19 MED ORDER — IOPAMIDOL (ISOVUE-370) INJECTION 76%
INTRAVENOUS | Status: AC
  Administered 2016-07-19: 50 mL
  Filled 2016-07-19: qty 50

## 2016-07-19 MED ORDER — ACETAMINOPHEN 650 MG RE SUPP: 650.0000 mg | RECTAL | Status: DC | PRN

## 2016-07-19 MED ORDER — SIMVASTATIN 20 MG PO TABS: 20.0000 mg | ORAL_TABLET | Freq: Every day | ORAL | Status: DC

## 2016-07-19 MED ORDER — SENNOSIDES-DOCUSATE SODIUM 8.6-50 MG PO TABS: 1.0000 | ORAL_TABLET | Freq: Every evening | ORAL | Status: DC | PRN

## 2016-07-19 MED ORDER — ACETAMINOPHEN 325 MG PO TABS
650.0000 mg | ORAL_TABLET | ORAL | Status: DC | PRN
  Administered 2016-07-20 – 2016-07-21 (×3): 650 mg via ORAL
  Filled 2016-07-19 (×3): qty 2

## 2016-07-19 MED ORDER — ASPIRIN 325 MG PO TABS
325.0000 mg | ORAL_TABLET | Freq: Every day | ORAL | Status: DC
  Administered 2016-07-19: 325 mg via ORAL
  Filled 2016-07-19: qty 1

## 2016-07-19 MED ORDER — ACETAMINOPHEN 160 MG/5ML PO SOLN: 650.0000 mg | ORAL | Status: DC | PRN

## 2016-07-19 MED ORDER — SODIUM CHLORIDE 0.9 % IV SOLN
INTRAVENOUS | Status: DC
  Administered 2016-07-19: 21:00:00 via INTRAVENOUS

## 2016-07-19 MED ORDER — ENOXAPARIN SODIUM 40 MG/0.4ML ~~LOC~~ SOLN
40.0000 mg | SUBCUTANEOUS | Status: DC
  Administered 2016-07-19 – 2016-07-20 (×2): 40 mg via SUBCUTANEOUS
  Filled 2016-07-19 (×2): qty 0.4

## 2016-07-19 MED ORDER — STROKE: EARLY STAGES OF RECOVERY BOOK
Freq: Once | Status: AC
  Administered 2016-07-19: 1
  Filled 2016-07-19: qty 1

## 2016-07-19 MED ORDER — ASPIRIN 300 MG RE SUPP
300.0000 mg | Freq: Every day | RECTAL | Status: DC
Start: 2016-07-19 — End: 2016-07-20

## 2016-07-19 NOTE — ED Provider Notes (Addendum)
Patient awakened this morning with a slight sore throat noticed slurred speech and difficulty walking. As well as facial droop She has markedly improved without treatment as per her daughters who accompany her. On exam patient is alert Glasgow Coma Score 15. H ENT exam no facial asymmetry neck supple no bruit lungs clear auscultation heart regular rate and rhythm abdomen nondistended nontender extremities without edema neurologic Glasgow Coma Score 15 finger-nose normal cranial nerves II through XII grossly intact motor strength 5 over 5 overall DTRs symmetric bilaterally at knee jerk ankle jerk and biceps toes downward going bilaterally  NIH stroke scale calculated@4  by nursing Orlie Dakin, MD 07/19/16 [place], MD 07/20/16 5726

## 2016-07-19 NOTE — ED Notes (Signed)
Pt being taken to CT.

## 2016-07-19 NOTE — Evaluation (Addendum)
\Clinical/Bedside Swallow Evaluation Patient Details  Name: Catherine [name] MRN: [id number] Date of Birth: Jan 29, 1946  Today's Date: 07/19/2016 Time: SLP Start Time (ACUTE ONLY): 1755 SLP Stop Time (ACUTE ONLY): 1810 SLP Time Calculation (min) (ACUTE ONLY): 15 min  Past Medical History:  Past Medical History:  Diagnosis Date  . Arthritis   . Cataract   . GERD (gastroesophageal reflux disease)   . TIA (transient ischemic attack)    Past Surgical History:  Past Surgical History:  Procedure Laterality Date  . EYE SURGERY     HPI:  Catherine Waldo Hortonis a 71 y.o.femalewith a past medical history of TIA, hyperlipidemia, who denies any history of hypertension, diabetes, who was in her usual state of health until this morning when she woke up and felt funny in her throat. Presented with left-sided facial droop, unsteady gait, numbness in face and legs. CT with no acute findings, MRI pending. RN completed stroke swallow screen, noted concerns re: coughing with thin liquids after straw sips. Seen for bedside swallowing evaluation.    Assessment / Plan / Recommendation Clinical Impression  Patient presents with mild aspiration risk given mild left facial droop/weakness, history of GERD and pneumonia. Pt reports coughing "only with water" which has been a persistent problem prior to this hospital admission. Her daughter states she typically drinks from a water bottle with a straw. During bedside assessment, patient initially swallows >3oz thin water in consecutive sips with no overt signs of aspiration, however is noted with delayed throat clear with straw sip, and immediate strong cough following multiple consecutive straw sips, suggestive of reduced airway protection. No further signs noted with cup sips. Oral control of the bolus, containment and clearance appear adequate, though very mildly reduced left labial seal noted. Patient reports several complaints suggestive of underlying primary esophageal  dysphagia, including coughing at night after lying down, globus sensation, post-nasal drip, frequent throat clearing. She does report a history of GERD, states she does not currently take any medications. Recommend initiating regular diet with thin liquids, no straws, medications whole in puree. SLP will f/u for tolerance, education, differential diagnosis.  SLP Visit Diagnosis: Dysphagia, unspecified (R13.10)    Aspiration Risk  Mild aspiration risk    Diet Recommendation Regular;Thin liquid   Liquid Administration via: Cup, No Straws Medication Administration: Whole meds with puree Supervision: Patient able to self feed Compensations: Slow rate;Small sips/bites Postural Changes: Seated upright at 90 degrees    Other  Recommendations Oral Care Recommendations: Oral care BID   Follow up Recommendations Other (comment) (TBD)      Frequency and Duration min 1 x/week  1 week       Prognosis Prognosis for Safe Diet Advancement: Good      Swallow Study   General Date of Onset: 07/19/16 HPI: Catherine Mcginty Hortonis a 71 y.o.femalewith a past medical history of TIA, hyperlipidemia, who denies any history of hypertension, diabetes, who was in her usual state of health until this morning when she woke up and felt funny in her throat. Presented with left-sided facial droop, unsteady gait, numbness in face and legs. CT with no acute findings, MRI pending. RN completed stroke swallow screen, noted concerns re: coughing with thin liquids after straw sips. Seen for bedside swallowing evaluation.  Type of Study: Bedside Swallow Evaluation Previous Swallow Assessment: Pt reports she had an MBS many years ago at OSH, reports unremarkable Diet Prior to this Study: NPO Temperature Spikes Noted: No Respiratory Status: Room air History of Recent Intubation: No  Behavior/Cognition: Alert;Cooperative;Pleasant mood Oral Cavity Assessment: Within Functional Limits Oral Care Completed by SLP: No Oral  Cavity - Dentition: Adequate natural dentition Vision: Functional for self-feeding Self-Feeding Abilities: Able to feed self Patient Positioning: Upright in bed Baseline Vocal Quality: Normal Volitional Cough: Strong Volitional Swallow: Able to elicit    Oral/Motor/Sensory Function Overall Oral Motor/Sensory Function: Mild impairment Facial ROM: Reduced left Facial Symmetry: Abnormal symmetry left Facial Strength: Reduced left Facial Sensation: Reduced left Lingual ROM: Within Functional Limits Lingual Symmetry: Within Functional Limits Lingual Strength: Within Functional Limits Lingual Sensation: Within Functional Limits Velum: Within Functional Limits Mandible: Within Functional Limits   Ice Chips Ice chips: Not tested   Thin Liquid Thin Liquid: Impaired Presentation: Cup;Straw Pharyngeal  Phase Impairments: Throat Clearing - Delayed;Cough - Immediate Other Comments:  (impaired with straw only)    Nectar Thick Nectar Thick Liquid: Not tested   Honey Thick Honey Thick Liquid: Not tested   Puree Puree: Within functional limits Presentation: Self Fed;Spoon   Solid   GO   Solid: Within functional limits Presentation: Catherine [name] CF-SLP Speech-Language Pathologist [phone]  [name] 07/19/2016,6:29 PM

## 2016-07-19 NOTE — ED Notes (Signed)
MD Jacobuwitz at the bedside

## 2016-07-19 NOTE — ED Notes (Signed)
Admitting MD at the bedside.  

## 2016-07-19 NOTE — Consult Note (Signed)
Neurology Consult Note  Reason for Consultation: stroke  Requesting provider: Bonnielee Haff, MD  CC: headache, R face numbness, RLE weakness, L face/arm weakness, speech changes  HPI: This is a 71 year old woman who presented to the emergency department because of difficulty speaking and walking. History is obtained directly from the patient. Her daughters are present at the bedside and offer additional information as needed.  Patient reports that earlier today, she felt extremely tired. She also had a headache and felt as if she was speaking more slowly than normal. She was able to go to church and sing with the choir without any difficulty. When she was stepping down from the choir, she felt as if she is having difficulty walking. She felt unsteady and thinks that her right leg was weak. At some point, her daughter noticed that she had a left facial droop and possible weakness of the left arm. The patient herself is unaware of these issues. She was brought to the emergency department for further evaluation. The ED physician noted a normal examination. The admitting hospitalist noted normal strength with dysdiadochokinesis on the left arm. She is now being admitted for further evaluation and neurology consultation is requested for recommendations.  Currently, she is not endorsing any symptoms apart from ongoing sensory changes in the right side of her face and a mild headache. She has a reported history of prior TIA in 2014 and states that symptoms at the time were similar to today's. She takes aspirin 81 mg daily and simvastatin 20 mg daily.  PMH:  Past Medical History:  Diagnosis Date  . Arthritis   . Cataract   . GERD (gastroesophageal reflux disease)   . TIA (transient ischemic attack)     PSH:  Past Surgical History:  Procedure Laterality Date  . EYE SURGERY      Family history: Family History  Problem Relation Age of Onset  . Dementia Mother   . Cancer Mother   . Stroke  Mother   . Cancer Father     Social history:  Social History   Social History  . Marital status: Widowed    Spouse name: N/A  . Number of children: 3  . Years of education: college   Occupational History  . retired    Social History Main Topics  . Smoking status: Never Smoker  . Smokeless tobacco: Never Used  . Alcohol use No  . Drug use: No  . Sexual activity: Yes   Other Topics Concern  . Not on file   Social History Narrative   Husband passes away last yr. 74.   Current inpatient meds: Medications reviewed and reconciled.  No current facility-administered medications for this encounter.    Current Outpatient Prescriptions  Medication Sig Dispense Refill  . ALOE VERA PO Take 1 tablet by mouth 2 (two) times daily.    Marland Kitchen aspirin EC 81 MG EC tablet Take 1 tablet (81 mg total) by mouth daily. 30 tablet 11  . B Complex-C (B-COMPLEX WITH VITAMIN C) tablet Take 1 tablet by mouth daily.    Marland Kitchen BIOTIN PO Take 1 tablet by mouth daily.    . calcium carbonate (OS-CAL) 600 MG TABS Take 600 mg by mouth 2 (two) times daily with a meal.    . CINNAMON PO Take 1 tablet by mouth daily.    . Coenzyme Q10 (COQ-10) 200 MG CAPS Take 200 mg by mouth daily.    Marland Kitchen FLUZONE HIGH-DOSE injection     . Ginkgo Biloba (  GINKGO PO) Take 1 tablet by mouth daily. Take 60 mg daily    . Omega-3 Fatty Acids (RA FISH OIL) 900 MG CAPS Take 900 mg by mouth daily.    Marland Kitchen PROAIR HFA 108 (90 BASE) MCG/ACT inhaler Inhale 1-2 puffs into the lungs as needed.     . simvastatin (ZOCOR) 20 MG tablet Take 20 mg by mouth at bedtime.      Allergies: No Known Allergies  ROS: As per HPI. A full 14-point review of systems was performed and is otherwise unremarkable.  PE:  BP (!) 145/71   Pulse 76   Temp 98 F (36.7 C)   Resp 18   Ht 5\' 3"  (1.6 m)   Wt 77.1 kg (170 lb)   SpO2 98%   BMI 30.11 kg/m   General: WDWN, no acute distress. AAO x4. Speech clear, no dysarthria. No aphasia. Follows commands briskly. Affect  is bright with congruent mood. Comportment is normal.  HEENT: Normocephalic. Neck supple without LAD. MMM, OP clear. Dentition good. Sclerae anicteric. No conjunctival injection.  CV: Regular, no murmur. Carotid pulses full and symmetric, no bruits. Distal pulses 2+ and symmetric.  Lungs: CTAB.  Abdomen: Soft, non-distended, non-tender. Bowel sounds present x4.  Extremities: No C/C/E. Neuro:  CN: Pupils are equal and round. They are symmetrically reactive from 3-->2 mm. EOMI. She has some gaze evoked nystagmus that is both horizontal and rotatory in character. There is breakable smooth pursuit in all directions. No reported diplopia. Facial sensation is intact to light touch. Face is symmetric at rest with normal strength and mobility. Hearing is intact to conversational voice. Palate elevates symmetrically and uvula is midline. Voice is normal in tone, pitch and quality. Bilateral SCM and trapezii are 5/5. Tongue is midline with normal bulk and mobility.  Motor: Normal bulk, tone, and strength. No tremor or other abnormal movements. No drift.  Sensation: Intact to light touch.  DTRs: 2+, symmetric with the exception of an absent knee jerk on the right where she has had prior knee replacement surgery. Toes downgoing bilaterally. No pathologic reflexes.  Coordination: Finger-to-nose and heel-to-shin are without dysmetria. She does have a fine tremor in both hands, left more than right, during finger to nose.   Labs:  Lab Results  Component Value Date   WBC 6.0 07/19/2016   HGB 13.9 07/19/2016   HCT 41.0 07/19/2016   PLT 266 07/19/2016   GLUCOSE 113 (H) 07/19/2016   CHOL 240 (H) 12/15/2012   TRIG 334 (H) 12/15/2012   HDL 43 12/15/2012   LDLCALC 130 (H) 12/15/2012   ALT 16 07/19/2016   AST 22 07/19/2016   NA 141 07/19/2016   K 3.9 07/19/2016   CL 101 07/19/2016   CREATININE 0.80 07/19/2016   BUN 11 07/19/2016   CO2 27 07/19/2016   TSH 0.993 08/16/2012   INR 1.00 07/19/2016   HGBA1C  5.7 (H) 12/14/2012   PTT 32 Troponin 0.00  Imaging:  I have personally and independently reviewed CT scan of the head without contrast from today. This shows mild diffuse atrophy consistent with age. A mild burden of chronic small vessel ischemic disease is present at the bihemispheric white matter.  Assessment and Plan:  1. Possible TIA/stroke: It's difficult to localize all of her symptoms together. However, if she truly had left facial droop and left arm weakness this raises concern for possible TIA versus a small stroke. Deficits appear to have fully resolved at this time. Her known risk factors for  cerebrovascular disease include age and previous history of TIA. At this time, agree with admission for further evaluation. Recommend MRI scan of the brain, carotid Dopplers, TTE, fasting lipids, and hemoglobin A1c. Since this episode occurred on aspirin, recommend switching to Plavix for secondary prevention. Statin with goal LDL less than 70. Ensure normoglycemia.  2. Left hemiparesis: She reportedly had transient weakness of the left face and left arm which has fully resolved at this time. Concerns for possible TIA/stroke as above. Follow exam.  3. Dysphasia: Again, she was reported to have some speech changes earlier today which have now fully resolved. Follow exam.  4. Right facial numbness: This is subjective in nature. No objective findings are seen on examination. Follow exam.  This was discussed with the patient and her daughters. Education was provided on the diagnosis and expected evaluation and treatment. They are in agreement with the plan as noted. They were given the opportunity to ask any questions and these were addressed to their satisfaction.   Thank you for this consultation. The stroke team will assume care of the patient beginning 07/20/16. Please call if any urgent questions or concerns arise.

## 2016-07-19 NOTE — ED Provider Notes (Signed)
[facility] DEPT Provider Note   CSN: [id number] Arrival date & time: 07/19/16  1458     History   Chief Complaint Chief Complaint  Patient presents with  . Weakness  . Facial Droop    HPI Catherine [name] is a 71 y.o. female PMH HTN, HLD, TIA presents for slowed speech, facial droop and unsteady gait. Onset of symptoms unclear. Pt states she woke up with right sided sore throat and feeling "off." She was able to do her morning chores. Pt was at church choir with family and states she felt rather fatigued afterwards. After eating lunch with her daughters, they noticed slowed speech. The noted she had trouble with finger to nose and a left sided facial droop. Pt takes 81 mg aspirin daily, no anticoagulation. Denies dizziness or light-headedness but states she just feels unsteady.   The history is provided by the patient, a relative and medical records.  Weakness  Primary symptoms include loss of balance, speech change.  Primary symptoms include no focal weakness, no loss of sensation, no memory loss, no movement disorder, no visual change, no auditory change, and no dizziness. This is a new problem. The current episode started less than 1 hour ago. The problem has been gradually improving. There was left facial focality noted. There has been no fever. Associated symptoms include headaches. Pertinent negatives include no shortness of breath, no chest pain, no vomiting, no altered mental status and no confusion. Associated medical issues do not include trauma, mood changes, a bleeding disorder, seizures, dementia or a clotting disorder. Associated medical issues comments: hx TIA.    Past Medical History:  Diagnosis Date  . Arthritis   . Cataract   . GERD (gastroesophageal reflux disease)   . TIA (transient ischemic attack)     Patient Active Problem List   Diagnosis Date Noted  . CVA (cerebral vascular accident) ([place]) 07/19/2016  . HLD (hyperlipidemia) 08/14/2014  . Obesity (BMI  30-39.9) 08/17/2013  . Hyperlipidemia LDL goal < 100 02/15/2013  . TIA (transient ischemic attack) 12/14/2012  . Essential hypertension, benign 12/14/2012  . Other and unspecified hyperlipidemia 12/14/2012    Past Surgical History:  Procedure Laterality Date  . EYE SURGERY      OB History    No data available       Home Medications    Prior to Admission medications   Medication Sig Start Date End Date Taking? Authorizing Provider  ALOE VERA PO Take 1 tablet by mouth daily as needed (INFLAMMATION).    Yes Historical Provider, MD  aspirin EC 81 MG EC tablet Take 1 tablet (81 mg total) by mouth daily. 12/16/12  Yes Marin Olp, MD  Calcium Citrate (CITRACAL PO) Take 1 tablet by mouth daily.   Yes Historical Provider, MD  CINNAMON PO Take 1 tablet by mouth daily.   Yes Historical Provider, MD  Cyanocobalamin (B-12 PO) Take 1 tablet by mouth daily.   Yes Historical Provider, MD  Ginkgo Biloba (GINKGO PO) Take 60 mg by mouth daily. Take 60 mg daily   Yes Historical Provider, MD  naproxen sodium (ANAPROX) 220 MG tablet Take 220 mg by mouth 2 (two) times daily as needed (PAIN).   Yes Historical Provider, MD  OVER THE COUNTER MEDICATION Take 1 application by mouth daily as needed (NASAL RASH).   Yes Historical Provider, MD  PROAIR HFA 108 (90 BASE) MCG/ACT inhaler Inhale 1-2 puffs into the lungs as needed for shortness of breath.  05/23/14  Yes Historical  Provider, MD  simvastatin (ZOCOR) 20 MG tablet Take 20 mg by mouth at bedtime.   Yes Historical Provider, MD  TEA TREE OIL EX Apply 1 application topically daily as needed (FOR NASAL RASH).   Yes Historical Provider, MD    Family History Family History  Problem Relation Age of Onset  . Dementia Mother   . Cancer Mother   . Stroke Mother   . Cancer Father     Social History Social History  Substance Use Topics  . Smoking status: Never Smoker  . Smokeless tobacco: Never Used  . Alcohol use No     Allergies   Patient has  no known allergies.   Review of Systems Review of Systems  Constitutional: Negative for fatigue and fever.  HENT: Positive for sore throat.   Eyes: Negative for visual disturbance.  Respiratory: Negative for shortness of breath.   Cardiovascular: Negative for chest pain and palpitations.  Gastrointestinal: Negative for abdominal pain, diarrhea, nausea and vomiting.  Musculoskeletal: Positive for back pain (chronic). Negative for neck pain and neck stiffness.  Neurological: Positive for speech change, facial asymmetry, speech difficulty, weakness, headaches and loss of balance. Negative for dizziness, tremors, focal weakness, seizures, syncope, light-headedness and numbness.  Psychiatric/Behavioral: Negative for confusion and memory loss.  All other systems reviewed and are negative.    Physical Exam Updated Vital Signs BP (!) 145/86 (BP Location: Left Arm)   Pulse 74   Temp 98.2 F (36.8 C) (Oral)   Resp 16   Ht 5\' 3"  (1.6 m)   Wt 79.9 kg   SpO2 99%   BMI 31.20 kg/m   Physical Exam  Constitutional: She is oriented to person, place, and time. She appears well-developed and well-nourished. No distress.  HENT:  Head: Normocephalic and atraumatic.  Mouth/Throat: Uvula is midline and oropharynx is clear and moist.  Eyes: Conjunctivae and EOM are normal. Pupils are equal, round, and reactive to light.  Neck: Normal range of motion. Neck supple.  Cardiovascular: Normal rate and regular rhythm.   No murmur heard. Pulmonary/Chest: Effort normal and breath sounds normal. No respiratory distress.  Abdominal: Soft. There is no tenderness.  Musculoskeletal: Normal range of motion. She exhibits no edema.  Neurological: She is alert and oriented to person, place, and time. She has normal strength. She displays no tremor. No cranial nerve deficit. She displays a negative Romberg sign. GCS eye subscore is 4. GCS verbal subscore is 5. GCS motor subscore is 6.  Symmetric 5/5 UE ane LE  strength. Finger to nose and heel to shin intact. No pronator drift. Walks with narrow unsteady gait, does not lean to either side but reached for bed to steady herself  Skin: Skin is warm and dry. Capillary refill takes less than 2 seconds.  Psychiatric: She has a normal mood and affect.  Nursing note and vitals reviewed.    ED Treatments / Results  Labs (all labs ordered are listed, but only abnormal results are displayed) Labs Reviewed  COMPREHENSIVE METABOLIC PANEL - Abnormal; Notable for the following:       Result Value   Glucose, Bld 111 (*)    Total Bilirubin 1.4 (*)    All other components within normal limits  CBG MONITORING, ED - Abnormal; Notable for the following:    Glucose-Capillary 129 (*)    All other components within normal limits  I-STAT CHEM 8, ED - Abnormal; Notable for the following:    Glucose, Bld 113 (*)    Calcium,  Ion 1.12 (*)    All other components within normal limits  PROTIME-INR  APTT  CBC  DIFFERENTIAL  HEMOGLOBIN A1C  TSH  LIPID PANEL  VITAMIN B12  CBC  COMPREHENSIVE METABOLIC PANEL  I-STAT TROPOININ, ED    EKG  EKG Interpretation  Date/Time:  Sunday July 19 2016 15:05:07 EDT Ventricular Rate:  84 PR Interval:  158 QRS Duration: 78 QT Interval:  376 QTC Calculation: 444 R Axis:     Text Interpretation:  Normal sinus rhythm Inferior infarct , age undetermined Anterior infarct , age undetermined ST & T wave abnormality, consider lateral ischemia Abnormal ECG No significant change since last tracing Confirmed by Winfred Leeds  MD, SAM [phone]) on 07/19/2016 4:13:53 PM       Radiology Ct Head Wo Contrast  Result Date: 07/19/2016 CLINICAL DATA:  Weakness, facial droop EXAM: CT HEAD WITHOUT CONTRAST TECHNIQUE: Contiguous axial images were obtained from the base of the skull through the vertex without intravenous contrast. COMPARISON:  12/14/2012 FINDINGS: Brain: No intracranial hemorrhage, mass effect or midline shift. Minimal cerebral  atrophy. Mild periventricular and patchy subcortical white matter decreased attenuation probable due to chronic small vessel ischemic changes. No definite acute cortical infarction. Vascular: Minimal atherosclerotic calcifications of carotid siphon Skull: No skull fracture. Sinuses/Orbits: No acute findings. Other: None IMPRESSION: No acute intracranial abnormality. Minimal cerebral atrophy. Mild periventricular and patchy subcortical white matter decreased attenuation probable due to chronic small vessel ischemic changes. No definite acute cortical infarction. Electronically Signed   By: Lahoma Crocker M.D.   On: 07/19/2016 15:48    Procedures Procedures (including critical care time)  Medications Ordered in ED Medications  0.9 %  sodium chloride infusion ( Intravenous New Bag/Given 07/19/16 2109)  acetaminophen (TYLENOL) tablet 650 mg (not administered)    Or  acetaminophen (TYLENOL) solution 650 mg (not administered)    Or  acetaminophen (TYLENOL) suppository 650 mg (not administered)  senna-docusate (Senokot-S) tablet 1 tablet (not administered)  enoxaparin (LOVENOX) injection 40 mg (40 mg Subcutaneous Given 07/19/16 2109)  aspirin suppository 300 mg ( Rectal See Alternative 07/19/16 2119)    Or  aspirin tablet 325 mg (325 mg Oral Given 07/19/16 2119)  simvastatin (ZOCOR) tablet 20 mg (not administered)   stroke: mapping our early stages of recovery book (1 each Does not apply Given 07/19/16 2109)  iopamidol (ISOVUE-370) 76 % injection (50 mLs  Contrast Given 07/19/16 2255)     Initial Impression / Assessment and Plan / ED Course  I have reviewed the triage vital signs and the nursing notes.  Pertinent labs & imaging results that were available during my care of the patient were reviewed by me and considered in my medical decision making (see chart for details).    71 y.o. female presents for unsteady gait, slowed speech and facial droop concerning for possible TIA. Uncertain onset of  symptoms. Last known well the previous evening. VSS, NAD. Alert and oriented. Neuro exam reassuring except for continued unsteady gait. CBG wnl. CT head NAICA. Labs unremarkable. Pt reports feeling better while in ED, however still feels unsteady. Will admit for MRI and further evaluation for TIA.   Discussed with my attending physician, Dr Winfred Leeds  Final Clinical Impressions(s) / ED Diagnoses   Final diagnoses:  Stroke (cerebrum) ([place])  Stroke (cerebrum) ([place])  TIA (transient ischemic attack)  TIA (transient ischemic attack)    New Prescriptions Current Discharge Medication List       Monico Blitz, MD 07/20/16 [phone]  Orlie Dakin, MD 07/20/16 [id number]

## 2016-07-19 NOTE — ED Notes (Signed)
Speech Therapy at the bedside

## 2016-07-19 NOTE — Progress Notes (Signed)
Patient admitted from ED via stretcher. Patient alert and oriented X4. Welcomed and oriented to unit and room. Telemetry initiated. Patient denies any pain ambulated to bathroom X1 assist.

## 2016-07-19 NOTE — H&P (Signed)
[facility] History and Physical  Catherine [name] RXV:[id number] DOB: [birth year] DOA: 07/19/2016   PCP: Dillard Essex (Inactive) [facility] Specialists: Has seen neurology previously for TIA  Chief Complaint: Unsteady gait and speech difficulty  HPI: Catherine [name] is a 71 y.o. female with a past medical history of TIA, hyperlipidemia, who denies any history of hypertension, diabetes, who was in her usual state of health until this morning when she woke up and felt funny in her throat. It felt as if that area was numb. Felt a little unsteady in the morning. She had to go for [employer] to her church. She went with her daughter. She felt fatigued. However, she did go through the rehearsals. She felt that she was moving more slowly than usual. She was talking more slowly. Her daughter noticed this as well. Her daughter noticed left-sided facial droop. She did have unsteady gait, but she wasn't falling to any one side. Also has felt numbness in her face and her legs. No loss of consciousness today. Denies any chest pain. She did experience some shortness of breath. No cough. No fever, no chills. No nausea or vomiting. Denies any falls or injuries. Her daughter got concerned with her symptoms and so she brought her to the emergency department.  In the ED, patient underwent workup including a CT scan of the head which did not show any acute abnormalities. Blood work was also unremarkable. Patient was noted to be hypertensive. She will need hospitalization for further evaluation.  Home Medications: Prior to Admission medications   Medication Sig Start Date End Date Taking? Authorizing Provider  ALOE VERA PO Take 1 tablet by mouth 2 (two) times daily.    Historical Provider, MD  aspirin EC 81 MG EC tablet Take 1 tablet (81 mg total) by mouth daily. 12/16/12   Marin Olp, MD  B Complex-C (B-COMPLEX WITH VITAMIN C) tablet Take 1 tablet by mouth daily.    Historical  Provider, MD  BIOTIN PO Take 1 tablet by mouth daily.    Historical Provider, MD  calcium carbonate (OS-CAL) 600 MG TABS Take 600 mg by mouth 2 (two) times daily with a meal.    Historical Provider, MD  CINNAMON PO Take 1 tablet by mouth daily.    Historical Provider, MD  Coenzyme Q10 (COQ-10) 200 MG CAPS Take 200 mg by mouth daily.    Historical Provider, MD  FLUZONE HIGH-DOSE injection  02/06/13   Historical Provider, MD  Ginkgo Biloba (GINKGO PO) Take 1 tablet by mouth daily. Take 60 mg daily    Historical Provider, MD  Omega-3 Fatty Acids (RA FISH OIL) 900 MG CAPS Take 900 mg by mouth daily.    Historical Provider, MD  PROAIR HFA 108 (90 BASE) MCG/ACT inhaler Inhale 1-2 puffs into the lungs as needed.  05/23/14   Historical Provider, MD  simvastatin (ZOCOR) 20 MG tablet Take 20 mg by mouth at bedtime.    Historical Provider, MD    Allergies: No Known Allergies  Past Medical History: Past Medical History:  Diagnosis Date  . Arthritis   . Cataract   . GERD (gastroesophageal reflux disease)   . TIA (transient ischemic attack)     Past Surgical History:  Procedure Laterality Date  . EYE SURGERY      Social History: Lives in [place]. Her daughter lives with her. Denies any history of smoking, alcohol use or illicit drug use. Usually very independent with daily activities. Does work in  the farm.  Family History:  Family History  Problem Relation Age of Onset  . Dementia Mother   . Cancer Mother   . Stroke Mother   . Cancer Father      Review of Systems - History obtained from the patient General ROS: positive for  - fatigue Psychological ROS: negative Ophthalmic ROS: negative ENT ROS: negative Allergy and Immunology ROS: negative Hematological and Lymphatic ROS: negative Endocrine ROS: negative Respiratory ROS: no cough, shortness of breath, or wheezing Cardiovascular ROS: no chest pain or dyspnea on exertion Gastrointestinal ROS: no abdominal pain, change in bowel  habits, or black or bloody stools Genito-Urinary ROS: no dysuria, trouble voiding, or hematuria Musculoskeletal ROS: negative Neurological ROS: as in hpi Dermatological ROS: negative  Physical Examination  Vitals:   07/19/16 1604 07/19/16 1605 07/19/16 1615 07/19/16 1630  BP:   (!) 159/77 (!) 167/74  Pulse: 70 68 79 68  Resp: 14 19 (!) 24 (!) 23  Temp:      TempSrc:      SpO2: 98% 98% 97% 98%  Weight:      Height:        BP (!) 167/74   Pulse 68   Temp 98 F (36.7 C) (Oral)   Resp (!) 23   Ht 5\' 3"  (1.6 m)   Wt 77.1 kg (170 lb)   SpO2 98%   BMI 30.11 kg/m   General appearance: alert, cooperative, appears stated age and no distress Head: Normocephalic, without obvious abnormality, atraumatic Eyes: conjunctivae/corneas clear. PERRL, EOM's intact.  Throat: lips, mucosa, and tongue normal; teeth and gums normal Neck: no adenopathy, no carotid bruit, no JVD, supple, symmetrical, trachea midline and thyroid not enlarged, symmetric, no tenderness/mass/nodules Resp: clear to auscultation bilaterally Cardio: regular rate and rhythm, S1, S2 normal, no murmur, click, rub or gallop GI: soft, non-tender; bowel sounds normal; no masses,  no organomegaly Extremities: extremities normal, atraumatic, no cyanosis or edema Pulses: 2+ and symmetric Skin: Skin color, texture, turgor normal. No rashes or lesions Lymph nodes: Cervical, supraclavicular, and axillary nodes normal. Neurologic: Awake and alert. Oriented 3. Cranial nerves II-12 seem intact , however, cannot entirely rule out subtle left-sided facial droop. No pronator drift. Finger-to-nose was off on the left side. Positive dysdiadochokinesis on the left arm. Strength appears to be equal bilateral upper and lower extremities. Gait was not assessed.   Labs on Admission: I have personally reviewed following labs and imaging studies  CBC:  Recent Labs Lab 07/19/16 1508 07/19/16 1522  WBC 6.0  --   NEUTROABS 3.2  --   HGB  14.0 13.9  HCT 42.0 41.0  MCV 89.0  --   PLT 266  --    Basic Metabolic Panel:  Recent Labs Lab 07/19/16 1508 07/19/16 1522  NA 140 141  K 3.8 3.9  CL 103 101  CO2 27  --   GLUCOSE 111* 113*  BUN 9 11  CREATININE 0.78 0.80  CALCIUM 9.7  --    GFR: Estimated Creatinine Clearance: 62.5 mL/min (by C-G formula based on SCr of 0.8 mg/dL). Liver Function Tests:  Recent Labs Lab 07/19/16 1508  AST 22  ALT 16  ALKPHOS 83  BILITOT 1.4*  PROT 7.3  ALBUMIN 4.2   Coagulation Profile:  Recent Labs Lab 07/19/16 1508  INR 1.00   CBG:  Recent Labs Lab 07/19/16 1510  GLUCAP 129*   Radiological Exams on Admission: Ct Head Wo Contrast  Result Date: 07/19/2016 CLINICAL DATA:  Weakness, facial  droop EXAM: CT HEAD WITHOUT CONTRAST TECHNIQUE: Contiguous axial images were obtained from the base of the skull through the vertex without intravenous contrast. COMPARISON:  12/14/2012 FINDINGS: Brain: No intracranial hemorrhage, mass effect or midline shift. Minimal cerebral atrophy. Mild periventricular and patchy subcortical white matter decreased attenuation probable due to chronic small vessel ischemic changes. No definite acute cortical infarction. Vascular: Minimal atherosclerotic calcifications of carotid siphon Skull: No skull fracture. Sinuses/Orbits: No acute findings. Other: None IMPRESSION: No acute intracranial abnormality. Minimal cerebral atrophy. Mild periventricular and patchy subcortical white matter decreased attenuation probable due to chronic small vessel ischemic changes. No definite acute cortical infarction. Electronically Signed   By: Lahoma Crocker M.D.   On: 07/19/2016 15:48    My interpretation of Electrocardiogram: Sinus rhythm at 84 bpm. Normal axis. Intervals appear to be normal. Nonspecific ST and T wave changes. T inversion noted in 1 and aVL.   Problem List  Principal Problem:   TIA (transient ischemic attack) Active Problems:   HLD (hyperlipidemia)    CVA (cerebral vascular accident) ([place])   Assessment: This is a 71 year old Caucasian female with a past history as stated earlier, significant mainly for TIA in 2014, who was in her usual state of health this morning when she woke up feeling unwell. She does appear to have left arm deficits. She has had sensory symptoms which are not consistent. Differential diagnosis includes stroke, TIA, seizure. She woke up feeling poorly, so onset time of onset is not entirely clear. She is outside the window for TPA.  Plan: #1 TIA versus stroke: Patient will need hospitalization for further workup. Neurology has been consulted. We will proceed with MRI brain, MRA head, carotid Dopplers, echocardiogram. Lipid panel will be checked. PTOT evaluation. Swallow screen and speech therapy to see. Aspirin for now. EEG.  #2 Elevated blood pressure: Denies any history of hypertension. For now, we will allow permissive hypertension. She may have hypertension and does not know about it. She may need definitive management at the time of discharge.  #3 history of hyperlipidemia: Check lipid panel in morning. She will need treatment with a statin. Home medication list is not current. Will need to be verified to be sure that she is indeed on a statin at home.  DVT Prophylaxis: Lovenox Code Status: Full code Family Communication: Discussed with the patient and 2 daughters  Disposition Plan: Telemetry  Consults called: Neurology  Admission status: She'll be admitted for stroke workup and management. She will be inpatient status.   Further management decisions will depend on results of further testing and patient's response to treatment.   [facility]  [facility] Pager [phone]  If 7PM-7AM, please contact night-coverage [email or web address] Password [facility]  07/19/2016, 5:07 PM

## 2016-07-19 NOTE — ED Triage Notes (Signed)
Pt and family reports pt woke up today reporting she felt like she had a sore throat. Pt speech appeared slurred and pt was not ambulating normally. Pt also did not eat her breakfast. Pt presents with left sided facial droop, Tongue midline, equal grips, no arm drift.

## 2016-07-20 ENCOUNTER — Inpatient Hospital Stay (HOSPITAL_COMMUNITY): Payer: Medicare Other

## 2016-07-20 ENCOUNTER — Encounter (HOSPITAL_COMMUNITY): Payer: Self-pay | Admitting: *Deleted

## 2016-07-20 DIAGNOSIS — G459 Transient cerebral ischemic attack, unspecified: Secondary | ICD-10-CM

## 2016-07-20 DIAGNOSIS — I639 Cerebral infarction, unspecified: Principal | ICD-10-CM

## 2016-07-20 DIAGNOSIS — Z8673 Personal history of transient ischemic attack (TIA), and cerebral infarction without residual deficits: Secondary | ICD-10-CM

## 2016-07-20 DIAGNOSIS — I63311 Cerebral infarction due to thrombosis of right middle cerebral artery: Secondary | ICD-10-CM

## 2016-07-20 LAB — COMPREHENSIVE METABOLIC PANEL
ALBUMIN: 3.8 g/dL (ref 3.5–5.0)
ALT: 16 U/L (ref 14–54)
AST: 23 U/L (ref 15–41)
Alkaline Phosphatase: 77 U/L (ref 38–126)
Anion gap: 12 (ref 5–15)
BUN: 10 mg/dL (ref 6–20)
CHLORIDE: 105 mmol/L (ref 101–111)
CO2: 23 mmol/L (ref 22–32)
Calcium: 8.9 mg/dL (ref 8.9–10.3)
Creatinine, Ser: 0.66 mg/dL (ref 0.44–1.00)
GFR calc non Af Amer: >60 mL/min (ref >60)
GLUCOSE: 89 mg/dL (ref 65–99)
Potassium: 4.3 mmol/L (ref 3.5–5.1)
SODIUM: 140 mmol/L (ref 135–145)
Total Bilirubin: 1.4 mg/dL — ABNORMAL HIGH (ref 0.3–1.2)
Total Protein: 6.4 g/dL — ABNORMAL LOW (ref 6.5–8.1)

## 2016-07-20 LAB — CBC
HCT: 39.5 % (ref 36.0–46.0)
HEMOGLOBIN: 12.9 g/dL (ref 12.0–15.0)
MCH: 29.1 pg (ref 26.0–34.0)
MCHC: 32.7 g/dL (ref 30.0–36.0)
MCV: 89 fL (ref 78.0–100.0)
PLATELETS: 224 10*3/uL (ref 150–400)
RBC: 4.44 MIL/uL (ref 3.87–5.11)
RDW: 13.6 % (ref 11.5–15.5)
WBC: 5.1 10*3/uL (ref 4.0–10.5)

## 2016-07-20 LAB — LIPID PANEL
Cholesterol: 193 mg/dL (ref 0–200)
HDL: 46 mg/dL (ref >40)
LDL CALC: 103 mg/dL — AB (ref 0–99)
Total CHOL/HDL Ratio: 4.2 RATIO
Triglycerides: 219 mg/dL — ABNORMAL HIGH (ref <150)
VLDL: 44 mg/dL — AB (ref 0–40)

## 2016-07-20 LAB — VITAMIN B12: Vitamin B-12: 389 pg/mL (ref 180–914)

## 2016-07-20 LAB — TSH: TSH: 3.19 u[IU]/mL (ref 0.350–4.500)

## 2016-07-20 MED ORDER — SIMVASTATIN 40 MG PO TABS: 40.0000 mg | ORAL_TABLET | Freq: Every day | ORAL | Status: DC

## 2016-07-20 MED ORDER — CLOPIDOGREL BISULFATE 75 MG PO TABS
75.0000 mg | ORAL_TABLET | Freq: Every day | ORAL | Status: DC
  Administered 2016-07-20 – 2016-07-21 (×2): 75 mg via ORAL
  Filled 2016-07-20 (×2): qty 1

## 2016-07-20 NOTE — Care Management Note (Signed)
Case Management Note  Patient Details  Name: Catherine [name] MRN: [id number] Date of Birth: 14-Dec-1945  Subjective/Objective:           Patient was admitted with CVA. Lives at home with adult children. CM will follow for discharge needs pending therapy evals and physician orders.          Action/Plan:   Expected Discharge Date:                  Expected Discharge Plan:     In-House Referral:     Discharge planning Services     Post Acute Care Choice:    Choice offered to:     DME Arranged:    DME Agency:     HH Arranged:    HH Agency:     Status of Service:     If discussed at [employer] of Stay Meetings, dates discussed:    Additional Comments:  Rolm Baptise, RN 07/20/2016, 10:16 AM

## 2016-07-20 NOTE — Progress Notes (Signed)
Rehab Admissions Coordinator Note:  Patient was screened by Retta Diones for appropriateness for an Inpatient Acute Rehab Consult.  At this time, we are recommending Inpatient Rehab consult.  Retta Diones 07/20/2016, 1:51 PM  I can be reached at [phone].

## 2016-07-20 NOTE — Procedures (Signed)
ELECTROENCEPHALOGRAM REPORT  Date of Study: 07/20/2016  Patient's Name: Catherine [name] MRN: [id number] Date of Birth: [birth year]  Referring Provider: Dr. Bonnielee Haff  Clinical History: This is a 71 year old woman with headache, numbness, weakness, speech changes.  Medications: acetaminophen (TYLENOL) tablet 650 mg  clopidogrel (PLAVIX) tablet 75 mg  enoxaparin (LOVENOX) injection 40 mg  senna-docusate (Senokot-S) tablet 1 tablet  simvastatin (ZOCOR) tablet 40 mg   Technical Summary: A multichannel digital EEG recording measured by the international 10-20 system with electrodes applied with paste and impedances below 5000 ohms performed in our laboratory with EKG monitoring in an awake and asleep patient.  Hyperventilation and photic stimulation were not performed.  The digital EEG was referentially recorded, reformatted, and digitally filtered in a variety of bipolar and referential montages for optimal display.    Description: The patient is awake and asleep during the recording. There is no clear posterior dominant rhythm. The record is symmetric.  During drowsiness and sleep, there is an increase in theta slowing of the background.  Vertex waves and symmetric sleep spindles were seen.  Hyperventilation and photic stimulation were not performed.  There were no epileptiform discharges or electrographic seizures seen.    EKG lead was unremarkable.  Impression: This awake and asleep EEG is normal.    Clinical Correlation: A normal EEG does not exclude a clinical diagnosis of epilepsy. Clinical correlation is advised.   Ellouise Newer, M.D.

## 2016-07-20 NOTE — Consult Note (Signed)
Physical Medicine and Rehabilitation Consult Reason for Consult: Acute ischemic nonhemorrhagic right basal ganglia infarct Referring Physician: [facility]   HPI: Catherine [name] is a 71 y.o. right handed female with history of TIA 2014 maintained on aspirin, hyperlipidemia. Presented 07/19/2016 with unsteady gait, left-sided weakness and speech difficulty. Per chart review patient lives with daughter and 91 year old grandson. Independent  prior to admission and very active. One level home. CT/MRI showed patchy small volume acute ischemic nonhemorrhagic right basal ganglia infarct. Small remote left thalamic lacunar infarct. CT angiogram of head and neck showed single short segment severe left A2 stenosis otherwise negative CTA. Patient did not receive TPA. EEG negative for seizure. Echocardiogram pending. Neurology consulted currently on Plavix for CVA prophylaxis. Subcutaneous Lovenox for DVT prophylaxis. Tolerating a regular diet. Physical therapy evaluation completed with recommendations of physical medicine rehabilitation consult.   Review of Systems  Constitutional: Negative for chills and fever.  HENT: Negative for hearing loss.   Eyes: Negative for blurred vision and double vision.  Respiratory: Negative for cough and shortness of breath.   Cardiovascular: Negative for chest pain, palpitations and leg swelling.  Gastrointestinal:       GERD  Genitourinary: Negative for dysuria, flank pain and hematuria.  Musculoskeletal: Positive for joint pain and myalgias.  Skin: Negative for rash.  Neurological: Positive for sensory change, speech change, focal weakness and headaches. Negative for seizures.  All other systems reviewed and are negative.  Past Medical History:  Diagnosis Date  . Arthritis   . Cataract   . GERD (gastroesophageal reflux disease)   . TIA (transient ischemic attack)    Past Surgical History:  Procedure Laterality Date  . EYE SURGERY     Family History    Problem Relation Age of Onset  . Dementia Mother   . Cancer Mother   . Stroke Mother   . Cancer Father    Social History:  reports that she has never smoked. She has never used smokeless tobacco. She reports that she does not drink alcohol or use drugs. Allergies: No Known Allergies Medications Prior to Admission  Medication Sig Dispense Refill  . ALOE VERA PO Take 1 tablet by mouth daily as needed (INFLAMMATION).     Marland Kitchen aspirin EC 81 MG EC tablet Take 1 tablet (81 mg total) by mouth daily. 30 tablet 11  . Calcium Citrate (CITRACAL PO) Take 1 tablet by mouth daily.    Marland Kitchen CINNAMON PO Take 1 tablet by mouth daily.    . Cyanocobalamin (B-12 PO) Take 1 tablet by mouth daily.    . Ginkgo Biloba (GINKGO PO) Take 60 mg by mouth daily. Take 60 mg daily    . naproxen sodium (ANAPROX) 220 MG tablet Take 220 mg by mouth 2 (two) times daily as needed (PAIN).    Marland Kitchen OVER THE COUNTER MEDICATION Take 1 application by mouth daily as needed (NASAL RASH).    Marland Kitchen PROAIR HFA 108 (90 BASE) MCG/ACT inhaler Inhale 1-2 puffs into the lungs as needed for shortness of breath.     . simvastatin (ZOCOR) 20 MG tablet Take 20 mg by mouth at bedtime.    . TEA TREE OIL EX Apply 1 application topically daily as needed (FOR NASAL RASH).      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, etc; slipped on ice recently, but no injury;  family report they can provide initial 65 hour assist ay d/c Functional Status:  Mobility: Bed Mobility General bed mobility comments: up in chair Transfers Overall transfer level: Needs assistance Equipment used: Rolling walker (2 wheeled) Transfers: Sit to/from Stand Sit to Stand: Min guard General transfer comment: steadying assist Ambulation/Gait Ambulation/Gait assistance: Min assist Ambulation Distance (Feet): 110 Feet Assistive device: Rolling walker (2 wheeled) Gait Pattern/deviations: Step-through pattern, Decreased stride length, Steppage, Drifts right/left, Wide base of support General Gait Details: over weight shifts to L and demonstrates instability esp with turns Gait velocity: slow pace    ADL:    Cognition: Cognition Overall Cognitive Status: Within Functional Limits for tasks assessed Arousal/Alertness: Awake/alert Orientation Level: Oriented X4 Attention: Sustained Sustained Attention: Impaired Sustained Attention Impairment: Verbal complex Memory: Impaired Memory Impairment: Retrieval deficit Awareness: Appears intact Problem Solving: Appears intact Safety/Judgment: Appears intact Cognition Arousal/Alertness: Awake/alert Behavior During Therapy: WFL for tasks assessed/performed Overall Cognitive Status: Within Functional Limits for tasks assessed  Blood pressure (!) 151/76, pulse 67, temperature 97.7 F (36.5 C), temperature source Oral, resp. rate 18, height 5\' 3"  (1.6 m), weight 79.9 kg (176 lb 2.4 oz), SpO2 97 %. Physical Exam  Vitals reviewed. Constitutional: She is oriented to person, place, and time.  HENT:  Left facial droop  Eyes: EOM are normal.  Neck: Normal range of motion. Neck supple. No thyromegaly present.  Cardiovascular: Normal rate and regular rhythm.   Respiratory: Effort normal and breath sounds normal. No respiratory distress.  GI: Soft. Bowel sounds are normal. She exhibits no distension.  Neurological: She is alert and  oriented to person, place, and time.  Follows full commands. Good insight and awareness. STM functional. Left central 7 and tongue deviation. Otherwise CN intact. Speech dysarthric but intellgible. LUE/LLE:  3-/5 deltoid, 3-/5 bicep, 3-/5 tricep, 3/5 wrist extension, 3-/5 hand intrinsics.      3/5 hip flexor, 3/5 knee extension, 4/5 ankle dorsiflexion, 4/5 ankle plantarflexion. Sensation 1+/2 LUE and LLE.    Skin: Skin is warm and dry.  Psychiatric: She has a normal mood and affect. Judgment and thought content normal.    Results for orders placed or performed during the hospital encounter of 07/19/16 (from the past 24 hour(s))  Protime-INR     Status: None   Collection Time: 07/19/16  3:08 PM  Result Value Ref Range   Prothrombin Time 13.3 11.4 - 15.2 seconds   INR 1.00   APTT     Status: None   Collection Time: 07/19/16  3:08 PM  Result Value Ref Range   aPTT 32 24 - 36 seconds  CBC     Status: None   Collection Time: 07/19/16  3:08 PM  Result Value Ref Range   WBC 6.0 4.0 - 10.5 K/uL   RBC 4.72 3.87 - 5.11 MIL/uL   Hemoglobin 14.0 12.0 - 15.0 g/dL   HCT 42.0 36.0 - 46.0 %   MCV 89.0 78.0 - 100.0 fL   MCH 29.7 26.0 - 34.0 pg   MCHC 33.3 30.0 - 36.0 g/dL   RDW 13.6 11.5 - 15.5 %   Platelets 266 150 - 400  K/uL  Differential     Status: None   Collection Time: 07/19/16  3:08 PM  Result Value Ref Range   Neutrophils Relative % 53 %   Neutro Abs 3.2 1.7 - 7.7 K/uL   Lymphocytes Relative 36 %   Lymphs Abs 2.1 0.7 - 4.0 K/uL   Monocytes Relative 9 %   Monocytes Absolute 0.5 0.1 - 1.0 K/uL   Eosinophils Relative 2 %   Eosinophils Absolute 0.1 0.0 - 0.7 K/uL   Basophils Relative 0 %   Basophils Absolute 0.0 0.0 - 0.1 K/uL  Comprehensive metabolic panel     Status: Abnormal   Collection Time: 07/19/16  3:08 PM  Result Value Ref Range   Sodium 140 135 - 145 mmol/L   Potassium 3.8 3.5 - 5.1 mmol/L   Chloride 103 101 - 111 mmol/L   CO2 27 22 - 32 mmol/L   Glucose, Bld 111 (H) 65  - 99 mg/dL   BUN 9 6 - 20 mg/dL   Creatinine, Ser 0.78 0.44 - 1.00 mg/dL   Calcium 9.7 8.9 - 10.3 mg/dL   Total Protein 7.3 6.5 - 8.1 g/dL   Albumin 4.2 3.5 - 5.0 g/dL   AST 22 15 - 41 U/L   ALT 16 14 - 54 U/L   Alkaline Phosphatase 83 38 - 126 U/L   Total Bilirubin 1.4 (H) 0.3 - 1.2 mg/dL   GFR calc non Af Amer >60 >60 mL/min   GFR calc Af Amer >60 >60 mL/min   Anion gap 10 5 - 15  CBG monitoring, ED     Status: Abnormal   Collection Time: 07/19/16  3:10 PM  Result Value Ref Range   Glucose-Capillary 129 (H) 65 - 99 mg/dL  I-stat troponin, ED     Status: None   Collection Time: 07/19/16  3:20 PM  Result Value Ref Range   Troponin i, poc 0.00 0.00 - 0.08 ng/mL   Comment 3          I-Stat Chem 8, ED     Status: Abnormal   Collection Time: 07/19/16  3:22 PM  Result Value Ref Range   Sodium 141 135 - 145 mmol/L   Potassium 3.9 3.5 - 5.1 mmol/L   Chloride 101 101 - 111 mmol/L   BUN 11 6 - 20 mg/dL   Creatinine, Ser 0.80 0.44 - 1.00 mg/dL   Glucose, Bld 113 (H) 65 - 99 mg/dL   Calcium, Ion 1.12 (L) 1.15 - 1.40 mmol/L   TCO2 29 0 - 100 mmol/L   Hemoglobin 13.9 12.0 - 15.0 g/dL   HCT 41.0 36.0 - 46.0 %  TSH     Status: None   Collection Time: 07/20/16  5:14 AM  Result Value Ref Range   TSH 3.190 0.350 - 4.500 uIU/mL  Lipid panel     Status: Abnormal   Collection Time: 07/20/16  5:14 AM  Result Value Ref Range   Cholesterol 193 0 - 200 mg/dL   Triglycerides 219 (H) <150 mg/dL   HDL 46 >40 mg/dL   Total CHOL/HDL Ratio 4.2 RATIO   VLDL 44 (H) 0 - 40 mg/dL   LDL Cholesterol 103 (H) 0 - 99 mg/dL  Vitamin B12     Status: None   Collection Time: 07/20/16  5:14 AM  Result Value Ref Range   Vitamin B-12 389 180 - 914 pg/mL  CBC     Status: None   Collection Time: 07/20/16  5:14 AM  Result Value Ref Range   WBC 5.1 4.0 - 10.5 K/uL   RBC 4.44 3.87 - 5.11 MIL/uL   Hemoglobin 12.9 12.0 - 15.0 g/dL   HCT 39.5 36.0 - 46.0 %   MCV 89.0 78.0 - 100.0 fL   MCH 29.1 26.0 - 34.0 pg    MCHC 32.7 30.0 - 36.0 g/dL   RDW 13.6 11.5 - 15.5 %   Platelets 224 150 - 400 K/uL  Comprehensive metabolic panel     Status: Abnormal   Collection Time: 07/20/16  5:14 AM  Result Value Ref Range   Sodium 140 135 - 145 mmol/L   Potassium 4.3 3.5 - 5.1 mmol/L   Chloride 105 101 - 111 mmol/L   CO2 23 22 - 32 mmol/L   Glucose, Bld 89 65 - 99 mg/dL   BUN 10 6 - 20 mg/dL   Creatinine, Ser 0.66 0.44 - 1.00 mg/dL   Calcium 8.9 8.9 - 10.3 mg/dL   Total Protein 6.4 (L) 6.5 - 8.1 g/dL   Albumin 3.8 3.5 - 5.0 g/dL   AST 23 15 - 41 U/L   ALT 16 14 - 54 U/L   Alkaline Phosphatase 77 38 - 126 U/L   Total Bilirubin 1.4 (H) 0.3 - 1.2 mg/dL   GFR calc non Af Amer >60 >60 mL/min   GFR calc Af Amer >60 >60 mL/min   Anion gap 12 5 - 15   Ct Angio Head W Or Wo Contrast  Result Date: 07/20/2016 CLINICAL DATA:  Initial evaluation for headache with right-sided weakness and numbness. EXAM: CT ANGIOGRAPHY HEAD AND NECK TECHNIQUE: Multidetector CT imaging of the head and neck was performed using the standard protocol during bolus administration of intravenous contrast. Multiplanar CT image reconstructions and MIPs were obtained to evaluate the vascular anatomy. Carotid stenosis measurements (when applicable) are obtained utilizing NASCET criteria, using the distal internal carotid diameter as the denominator. CONTRAST:  50 cc of Isovue 370. COMPARISON:  Prior CT from earlier same day. FINDINGS: CTA NECK FINDINGS Aortic arch: Visualized aortic arch of normal caliber with normal branch pattern. Minimal plaque noted within the arch itself. No high-grade stenosis about the origin of the great vessels. Visualized subclavian arteries widely patent. Right carotid system: Right common carotid artery patent from its origin to the bifurcation. Right ICA widely patent from the bifurcation to the skullbase. No stenosis, dissection, or vascular occlusion within the right carotid artery system. Left carotid system: Left common  carotid artery patent from its origin to the bifurcation. No significant narrowing about the left bifurcation. Left ICA widely patent from the bifurcation to the skullbase. No stenosis, dissection, or vascular occlusion within the left carotid artery system. Vertebral arteries: Both of the vertebral arteries arise from the subclavian arteries. Right vertebral artery dominant. Left vertebral artery diffusely hypoplastic. Vertebral arteries patent without stenosis, dissection, or occlusion. Skeleton: No acute osseous abnormality. No worrisome lytic or blastic osseous lesions. Multilevel degenerative spondylolysis, greatest at C5-6. Other neck: Soft tissues of the neck demonstrate no acute abnormality. Asymmetric enlargement of the right lobe of thyroid noted. No adenopathy. Upper chest: Visualized upper mediastinum within normal limits. Visualized lungs are clear. Review of the MIP images confirms the above findings CTA HEAD FINDINGS Anterior circulation: Petrous segments widely patent bilaterally. Minimal atheromatous plaque within the cavernous ICAs without flow-limiting stenosis. Supraclinoid segments widely patent. ICA termini patent. Left A1 segment widely patent. Right A1 segment hypoplastic/absent. Anterior communicating artery normal. Anterior cerebral arteries patent to their distal  aspects. There is a focal high-grade stenosis within the distal left A2 branch (series 506, image 19). M1 segments widely patent without stenosis or occlusion. No proximal M2 occlusion. Distal MCA branches well opacified and symmetric. Posterior circulation: Dominant right vertebral artery widely patent to the vertebrobasilar junction. Moderate smooth narrowing of the hypoplastic left V4 segment (series 502, image 157). Left vertebral artery largely terminates in PICA, although a tiny branch adjacent towards the vertebrobasilar junction. Basilar artery widely patent. Superior cerebral arteries patent bilaterally. Left PCA supplied  via the basilar and is widely patent to its distal aspect. Fetal type right PCA supplied via a widely patent right posterior communicating artery. Right PCA also patent to its distal aspect. Venous sinuses: Patent. Anatomic variants: Fetal type right PCA. No aneurysm or vascular malformation. Delayed phase: No pathologic enhancement. Review of the MIP images confirms the above findings IMPRESSION: CTA NECK IMPRESSION: Negative CTA of the neck. No significant atheromatous disease for patient age. No high-grade or flow-limiting stenosis. CTA HEAD IMPRESSION: 1. Single short-segment severe left A2 stenosis. 2. Otherwise negative CTA. No other high-grade or correctable stenosis. 3. 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 Dominant right vertebral artery widely patent to the vertebral basilar junction. Fetal type right PCA. 4. Hypoplastic/absent right A1 segment, with the anterior cerebral arteries supplied via the left internal carotid artery system. Electronically Signed   By: Jeannine Boga M.D.   On: 07/20/2016 01:46   Ct Head Wo Contrast  Result Date: 07/19/2016 CLINICAL DATA:  Weakness, facial droop EXAM: CT HEAD WITHOUT CONTRAST TECHNIQUE: Contiguous axial images were obtained from the base of the skull through the vertex without intravenous contrast. COMPARISON:  12/14/2012 FINDINGS: Brain: No intracranial hemorrhage, mass effect or midline shift. Minimal cerebral atrophy. Mild periventricular and patchy subcortical white matter decreased attenuation probable due to chronic small vessel ischemic changes. No definite acute cortical infarction. Vascular: Minimal atherosclerotic calcifications of carotid siphon Skull: No skull fracture. Sinuses/Orbits: No acute findings. Other: None IMPRESSION: No acute intracranial abnormality. Minimal cerebral atrophy. Mild periventricular and patchy subcortical white matter decreased attenuation probable due to chronic small vessel ischemic changes.  No definite acute cortical infarction. Electronically Signed   By: Lahoma Crocker M.D.   On: 07/19/2016 15:48   Ct Angio Neck W Or Wo Contrast  Result Date: 07/20/2016 CLINICAL DATA:  Initial evaluation for headache with right-sided weakness and numbness. EXAM: CT ANGIOGRAPHY HEAD AND NECK TECHNIQUE: Multidetector CT imaging of the head and neck was performed using the standard protocol during bolus administration of intravenous contrast. Multiplanar CT image reconstructions and MIPs were obtained to evaluate the vascular anatomy. Carotid stenosis measurements (when applicable) are obtained utilizing NASCET criteria, using the distal internal carotid diameter as the denominator. CONTRAST:  50 cc of Isovue 370. COMPARISON:  Prior CT from earlier same day. FINDINGS: CTA NECK FINDINGS Aortic arch: Visualized aortic arch of normal caliber with normal branch pattern. Minimal plaque noted within the arch itself. No high-grade stenosis about the origin of the great vessels. Visualized subclavian arteries widely patent. Right carotid system: Right common carotid artery patent from its origin to the bifurcation. Right ICA widely patent from the bifurcation to the skullbase. No stenosis, dissection, or vascular occlusion within the right carotid artery system. Left carotid system: Left common carotid artery patent from its origin to the bifurcation. No significant narrowing about the left bifurcation. Left ICA widely patent from the bifurcation to the skullbase. No stenosis, dissection, or vascular occlusion within the left carotid  artery system. Vertebral arteries: Both of the vertebral arteries arise from the subclavian arteries. Right vertebral artery dominant. Left vertebral artery diffusely hypoplastic. Vertebral arteries patent without stenosis, dissection, or occlusion. Skeleton: No acute osseous abnormality. No worrisome lytic or blastic osseous lesions. Multilevel degenerative spondylolysis, greatest at C5-6. Other  neck: Soft tissues of the neck demonstrate no acute abnormality. Asymmetric enlargement of the right lobe of thyroid noted. No adenopathy. Upper chest: Visualized upper mediastinum within normal limits. Visualized lungs are clear. Review of the MIP images confirms the above findings CTA HEAD FINDINGS Anterior circulation: Petrous segments widely patent bilaterally. Minimal atheromatous plaque within the cavernous ICAs without flow-limiting stenosis. Supraclinoid segments widely patent. ICA termini patent. Left A1 segment widely patent. Right A1 segment hypoplastic/absent. Anterior communicating artery normal. Anterior cerebral arteries patent to their distal aspects. There is a focal high-grade stenosis within the distal left A2 branch (series 506, image 19). M1 segments widely patent without stenosis or occlusion. No proximal M2 occlusion. Distal MCA branches well opacified and symmetric. Posterior circulation: Dominant right vertebral artery widely patent to the vertebrobasilar junction. Moderate smooth narrowing of the hypoplastic left V4 segment (series 502, image 157). Left vertebral artery largely terminates in PICA, although a tiny branch adjacent towards the vertebrobasilar junction. Basilar artery widely patent. Superior cerebral arteries patent bilaterally. Left PCA supplied via the basilar and is widely patent to its distal aspect. Fetal type right PCA supplied via a widely patent right posterior communicating artery. Right PCA also patent to its distal aspect. Venous sinuses: Patent. Anatomic variants: Fetal type right PCA. No aneurysm or vascular malformation. Delayed phase: No pathologic enhancement. Review of the MIP images confirms the above findings IMPRESSION: CTA NECK IMPRESSION: Negative CTA of the neck. No significant atheromatous disease for patient age. No high-grade or flow-limiting stenosis. CTA HEAD IMPRESSION: 1. Single short-segment severe left A2 stenosis. 2. Otherwise negative CTA. No  other high-grade or correctable stenosis. 3. 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 Dominant right vertebral artery widely patent to the vertebral basilar junction. Fetal type right PCA. 4. Hypoplastic/absent right A1 segment, with the anterior cerebral arteries supplied via the left internal carotid artery system. Electronically Signed   By: Jeannine Boga M.D.   On: 07/20/2016 01:46   Mr Brain Wo Contrast  Result Date: 07/20/2016 CLINICAL DATA:  Evaluation for slurred speech, right facial droop. EXAM: MRI HEAD WITHOUT CONTRAST TECHNIQUE: Multiplanar, multiecho pulse sequences of the brain and surrounding structures were obtained without intravenous contrast. COMPARISON:  Prior CTA from 07/19/2016. FINDINGS: Brain: Diffuse prominence of the CSF containing spaces compatible with generalized cerebral atrophy. Patchy and confluent T2/FLAIR hyperintensity within the periventricular and deep white matter both cerebral hemispheres most compatible chronic microvascular disease, mild for age. Remote lacunar infarct present within the left thalamus. There is patchy abnormal restricted diffusion involving the posterior right lentiform nucleus extending towards the periventricular white matter of the right corona radiata (series 4, image 29). No associated hemorrhage or mass effect. No other evidence for acute or subacute ischemia. No other evidence for acute or chronic intracranial hemorrhage. No other areas of chronic infarction identified. No mass lesion, midline shift or mass effect. Ventricles normal in size without evidence for hydrocephalus. No extra-axial fluid collection. Major dural sinuses are grossly patent. Pituitary gland within normal limits. Vascular: Major intracranial vascular flow voids are maintained. Skull and upper cervical spine: Craniocervical junction within normal limits. Visualized upper cervical spine unremarkable. Bone marrow signal intensity within normal limits.  No scalp  soft tissue abnormality. Sinuses/Orbits: Globes and orbital soft tissues within normal limits. Patient status post lens extraction bilaterally. Scattered mucosal thickening within the ethmoidal air cells and maxillary sinuses. Paranasal sinuses are otherwise clear. Small bilateral mastoid effusions, right greater than left. Inner ear structures normal. Other: No other significant finding. IMPRESSION: 1. Patchy small volume acute ischemic nonhemorrhagic right basal ganglia infarct. 2. Small remote left thalamic lacunar infarct. 3. Generalized age-related cerebral atrophy with mild chronic microvascular ischemic disease. Electronically Signed   By: Jeannine Boga M.D.   On: 07/20/2016 03:21    Assessment/Plan: Diagnosis: Right basal ganglia infarct with left hemiparesis 1. Does the need for close, 24 hr/day medical supervision in concert with the patient's rehab needs make it unreasonable for this patient to be served in a less intensive setting? Yes 2. Co-Morbidities requiring supervision/potential complications: HTn, post-stroke sequelae 3. Due to bladder management, bowel management, safety, skin/wound care, disease management, medication administration, pain management and patient education, does the patient require 24 hr/day rehab nursing? Yes 4. Does the patient require coordinated care of a physician, rehab nurse, PT (1-2 hrs/day, 5 days/week), OT (1-2 hrs/day, 5 days/week) and SLP (1-2 hrs/day, 5 days/week) to address physical and functional deficits in the context of the above medical diagnosis(es)? Yes Addressing deficits in the following areas: balance, endurance, locomotion, strength, transferring, bowel/bladder control, bathing, dressing, feeding, grooming, toileting, speech and psychosocial support 5. Can the patient actively participate in an intensive therapy program of at least 3 hrs of therapy per day at least 5 days per week? Yes 6. The potential for patient to make  measurable gains while on inpatient rehab is excellent 7. Anticipated functional outcomes upon discharge from inpatient rehab are modified independent  with PT, modified independent with OT, modified independent with SLP. 8. Estimated rehab length of stay to reach the above functional goals is: 8-13 days 9. Does the patient have adequate social supports and living environment to accommodate these discharge functional goals? Yes 10. Anticipated D/C setting: Home 11. Anticipated post D/C treatments: HH therapy and Outpatient therapy 12. Overall Rehab/Functional Prognosis: excellent  RECOMMENDATIONS: This patient's condition is appropriate for continued rehabilitative care in the following setting: CIR Patient has agreed to participate in recommended program. Yes Note that insurance prior authorization may be required for reimbursement for recommended care.  Comment: Excellent candidate. Rehab Admissions Coordinator to follow up.  Thanks,  Meredith Staggers, MD, Mellody Drown    Cathlyn Parsons., PA-C 07/20/2016

## 2016-07-20 NOTE — Evaluation (Signed)
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 She currently requires overall min assist for ADL tasks and presents with L UE weakness, decreased balance, and decreased L UE fine and gross motor coordination impacting her ability to complete ADL at PLOF. Pt additionally demonstrated slow processing during evaluation. Feel pt is an excellent candidate for CIR level rehabilitation in order to maximize return to PLOF and independence with ADL. She would benefit from continued OT services while admitted in order to improve independence with ADL and functional mobility.    Follow Up Recommendations  CIR;Supervision/Assistance - 24 hour    Equipment Recommendations  Other (comment) (TBD at next venue of care)    Recommendations for Other Services       Precautions / Restrictions Precautions Precautions: Fall Restrictions Weight Bearing Restrictions: No      Mobility Bed Mobility Overal bed mobility: Needs Assistance Bed Mobility: Supine to Sit     Supine to sit: Min guard        Transfers Overall transfer level: Needs assistance Equipment used: Rolling walker (2 wheeled) Transfers: Sit to/from Stand Sit to Stand: Min guard         General transfer comment: steadying assist    Balance Overall balance assessment: Needs assistance Sitting-balance support: No upper extremity supported;Feet supported Sitting balance-Leahy Scale: Good Sitting balance - Comments: Sitting EOB no back support   Standing  balance support: No upper extremity supported;Bilateral upper extremity supported;During functional activity Standing balance-Leahy Scale: Fair Standing balance comment: Able to statically stand at sink without UE support briefly during ADL tasks.                           ADL either performed or assessed with clinical judgement   ADL Overall ADL's : Needs assistance/impaired Eating/Feeding: Sitting;Minimal assistance Eating/Feeding Details (indicate cue type and reason): Educated on checking for pocketing frequently due to L sided facial droop and decreased oral motor strength and sensation. Requires assistance to cut food and manipulate utensils. Grooming: Minimal assistance;Standing   Upper Body Bathing: Sitting;Min guard   Lower Body Bathing: Min guard;Sit to/from stand   Upper Body Dressing : Minimal assistance;Sitting   Lower Body Dressing: Sit to/from stand;Minimal assistance   Toilet Transfer: Minimal assistance;Ambulation;RW;BSC Toilet Transfer Details (indicate cue type and reason): Simulated in room from bed to chair Toileting- Clothing Manipulation and Hygiene: Minimal assistance;Sit to/from stand       Functional mobility during ADLs: Minimal assistance;Rolling walker General ADL Comments: Pt with difficulty donning glasses due to decreased L UE fine motor coordination.     Vision Baseline Vision/History: Wears glasses Wears Glasses: Reading only Patient Visual Report: No change from baseline Vision Assessment?: Yes Eye Alignment: Within Functional Limits Ocular Range of Motion: Within Functional Limits Alignment/Gaze Preference: Within Defined Limits Tracking/Visual Pursuits: Decreased smoothness of horizontal tracking Saccades: Within functional limits Convergence: Within functional limits Visual Fields: No apparent deficits Additional Comments: Able to use vision functionally during ADL and read well.     Perception     Praxis Praxis Praxis  tested?: Within functional limits    Pertinent Vitals/Pain Pain Assessment: No/denies pain     Hand Dominance Right   Extremity/Trunk Assessment Upper Extremity Assessment Upper Extremity Assessment: LUE deficits/detail LUE Deficits / Details: Shoulder flexion 4-/5, elbow flexion and extension 4/5, grasp 4/5. Decreased light touch sensation. Signs of dysmetria with finger to nose, decreased fine motor coordination, and slowed movement with rapid pronation/supination. LUE Sensation: decreased light touch LUE Coordination: decreased fine motor;decreased gross motor   Lower Extremity Assessment Lower Extremity Assessment: Defer to PT evaluation       Communication Communication Communication: No difficulties   Cognition Arousal/Alertness: Awake/alert Behavior During Therapy: WFL for tasks assessed/performed Overall Cognitive Status: No family/caregiver present to determine baseline cognitive functioning Area of Impairment: Problem solving                             Problem Solving: Slow processing General Comments: Required increased time for processing but was able to complete higher level cognitive tasks when increased time was provided. Plan to assess financial management tasks next session.   General Comments       Exercises     Shoulder Instructions      Home Living Family/patient expects to be discharged to:: Private residence Living Arrangements: Children Available Help at Discharge: Family;Available PRN/intermittently Type of Home: House Home Access: Stairs to enter Entrance Stairs-Number of Steps: 1 small step   Home Layout: One level     Bathroom Shower/Tub: Occupational psychologist: Handicapped height     Home Equipment: Cane - single point;Walker - 2 wheels;Shower seat;Bedside commode          Prior Functioning/Environment Level of Independence: Independent        Comments: 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, etc; slipped on ice recently, but no injury; family report they can provide initial 24 hour assist ay d/c        OT Problem List: Decreased strength;Decreased activity tolerance;Impaired balance (sitting and/or standing);Decreased coordination;Decreased cognition;Decreased safety awareness;Decreased knowledge of use of DME or AE;Decreased knowledge of precautions;Impaired UE functional use      OT Treatment/Interventions: Self-care/ADL training;Therapeutic exercise;Energy conservation;Therapeutic activities;Cognitive remediation/compensation;Patient/family education;Balance training    OT Goals(Current goals can be found in the care plan section) Acute Rehab OT Goals Patient Stated Goal: To return to independent OT Goal Formulation: With patient Time For Goal Achievement: 08/03/16 Potential to Achieve Goals: Good ADL Goals Pt Will Perform Grooming: with modified independence;standing (3 tasks) Pt Will Transfer to Toilet: with modified independence;ambulating;regular height toilet Pt Will Perform Toileting - Clothing Manipulation and hygiene: with modified independence;sit to/from stand Pt/caregiver will Perform Home Exercise Program: Left upper extremity;With written HEP provided;Independently;Increased strength Additional ADL Goal #1: Pt will independently complete fine motor coordination and strengthening HEP with L hand in order to improve functional use of L UE during ADL. Additional ADL Goal #2: Pt will demonstrate improved fine motor coordination to complete a variety of fasteners during dressing tasks.  OT Frequency: Min 2X/week   Barriers to D/C:            Co-evaluation              End of Session Equipment Utilized During Treatment: Gait belt;Rolling walker Nurse Communication: Mobility status  Activity Tolerance: Patient tolerated treatment well Patient left: in chair;with call bell/phone within reach;with chair alarm set  OT Visit Diagnosis:  Unsteadiness on feet (R26.81);Hemiplegia and hemiparesis  Hemiplegia - Right/Left: Left Hemiplegia - dominant/non-dominant: Non-Dominant Hemiplegia - caused by: Cerebral infarction                Time: 1627-1700 OT Time Calculation (min): 33 min Charges:  OT General Charges $OT Visit: 1 Procedure OT Evaluation $OT Eval Moderate Complexity: 1 Procedure OT Treatments $Self Care/Home Management : 8-22 mins G-Codes:     Norman Herrlich, MS OTR/L  Pager: Pierce A Demetrie Borge 07/20/2016, 5:43 PM

## 2016-07-20 NOTE — Evaluation (Signed)
Physical Therapy Evaluation Patient Details Name: Catherine [name] MRN: [id number] DOB: 1946-04-22 Today's Date: 07/20/2016   History of Present Illness  71 year old female, lives alone on her 10 acre farm, physically independent and very active, PMH of TIA, HLD, GERD, admitted with facial droop, worsening unsteady gait, and numbness/weakness left extremities. MRI confirmed right basal ganglia stroke  Clinical Impression  Patient presents with decreased independence with mobility due to deficits listed in PT problem list.  She demonstrates decreased balance, decreased L side strength, coordination and sensation and poor postural awareness with high risk for falls.  She currently needs min A for ambulation with walker and previously with independent running a farm.  Feel she will benefit from skilled PT in the acute setting to maximize safety and independence prior to d/c to CIR level rehab.     Follow Up Recommendations CIR    Equipment Recommendations  None recommended by PT    Recommendations for Other Services Rehab consult     Precautions / Restrictions Precautions Precautions: Fall      Mobility  Bed Mobility               General bed mobility comments: up in chair  Transfers Overall transfer level: Needs assistance Equipment used: Rolling walker (2 wheeled) Transfers: Sit to/from Stand Sit to Stand: Min guard         General transfer comment: steadying assist  Ambulation/Gait Ambulation/Gait assistance: Min assist Ambulation Distance (Feet): 110 Feet Assistive device: Rolling walker (2 wheeled) Gait Pattern/deviations: Step-through pattern;Decreased stride length;Steppage;Drifts right/left;Wide base of support Gait velocity: slow pace   General Gait Details: over weight shifts to L and demonstrates instability esp with turns  Stairs            Wheelchair Mobility    Modified Rankin (Stroke Patients Only) Modified Rankin (Stroke Patients  Only) Pre-Morbid Rankin Score: No symptoms Modified Rankin: Moderately severe disability     Balance Overall balance assessment: Needs assistance   Sitting balance-Leahy Scale: Good     Standing balance support: No upper extremity supported Standing balance-Leahy Scale: Poor Standing balance comment: needs S for standing when without UE support; stands 30 sec unsupported with small step with S, stands eyes closed <10 sec unsupported with close S, stands without UE support to look over shoulder on each side with S                             Pertinent Vitals/Pain Pain Assessment: No/denies pain    Home Living Family/patient expects to be discharged to:: Private residence Living Arrangements: Children Available Help at Discharge: Family;Available PRN/intermittently Type of Home: House Home Access: Stairs to enter   Entrance Stairs-Number of Steps: 1 small step Home Layout: One level Home Equipment: Cane - single point;Bedside commode;Walker - 2 wheels      Prior Function Level of Independence: Independent         Comments: 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, etc; slipped on ice recently, but no injury; family report they can provide initial 24 hour assist ay d/c     Hand Dominance   Dominant Hand: Right    Extremity/Trunk Assessment   Upper Extremity Assessment Upper Extremity Assessment: LUE deficits/detail LUE Deficits / Details: shoulder flexion 4-/5, elbow flex 4/5, extension 4-/5 reports altered sensation, slowed coordination with FNF and pron/sup LUE Sensation: decreased light touch    Lower Extremity Assessment Lower Extremity Assessment: LLE  deficits/detail LLE Deficits / Details: AROM WFL, strength hip flexion 4-/5, knee extension 4/5, ankle DF 4-/5, slowed coord with toe tapping    Cervical / Trunk Assessment Cervical / Trunk Assessment: Kyphotic  Communication   Communication: No difficulties  Cognition  Arousal/Alertness: Awake/alert Behavior During Therapy: WFL for tasks assessed/performed Overall Cognitive Status: Within Functional Limits for tasks assessed                                        General Comments General comments (skin integrity, edema, etc.): patient fearful and unstable during balance testing    Exercises     Assessment/Plan    PT Assessment Patient needs continued PT services  PT Problem List Decreased balance;Decreased coordination;Decreased mobility;Decreased strength;Decreased safety awareness;Decreased knowledge of use of DME;Decreased activity tolerance       PT Treatment Interventions DME instruction;Gait training;Balance training;Functional mobility training;Neuromuscular re-education;Therapeutic exercise;Therapeutic activities;Patient/family education    PT Goals (Current goals can be found in the Care Plan section)  Acute Rehab PT Goals Patient Stated Goal: To return to independent PT Goal Formulation: With patient/family Time For Goal Achievement: 07/27/16 Potential to Achieve Goals: Good    Frequency Min 4X/week   Barriers to discharge        Co-evaluation               End of Session Equipment Utilized During Treatment: Gait belt Activity Tolerance: Patient tolerated treatment well Patient left: in chair;with call bell/phone within reach;with family/visitor present   PT Visit Diagnosis: Other abnormalities of gait and mobility (R26.89);Muscle weakness (generalized) (M62.81);Hemiplegia and hemiparesis Hemiplegia - Right/Left: Left Hemiplegia - dominant/non-dominant: Non-dominant Hemiplegia - caused by: Cerebral infarction    Time: 1206-1229 PT Time Calculation (min) (ACUTE ONLY): 23 min   Charges:   PT Evaluation $PT Eval Moderate Complexity: 1 Procedure PT Treatments $Gait Training: 8-22 mins   PT G CodesMagda Kiel, [place] 07/20/2016   Reginia Naas 07/20/2016, 1:01 PM

## 2016-07-20 NOTE — CV Procedure (Signed)
2D echo attempted, but patient went to EEG

## 2016-07-20 NOTE — Consult Note (Signed)
[facility] Primary Care Navigator  07/20/2016  ELMO SHUMARD 02-12-1946 [id number]   Met with patient and daughter York Cerise) at the bedside to identify possible discharge needs. Patient just arrived from a procedure (EEG). Patient reports having funny feeling in her throat, felt unsteady and uncoordinated after singing at church that had led to this admission.  Patient endorses Dr. Donald Prose with [facility] as her primary care provider.   Patient shared using [facility] in [place] to obtain medications without difficulty.   Patient reports managing her own medications at home using "pill box" system weekly.  Patient is independent with self care and able to drive prior to admission. Her daughters York Cerise, Sharyn Lull and Ebony Hail) can provide transportation to her doctors' appointments when discharged.  Daughter York Cerise) lives with her. Daughter mentioned that all of patient's children work and hoping that patient will be able to take care of self after discharge from [place]. If not, will make further plans per daughter.  Discharge plan is [facility]) per PT recommendation.  Patient and daughter voiced understanding to call primary care provider's office when she returns home, for a post discharge follow-up appointment within a week or sooner if needs arise. Patient letter (with PCP's contact number) was provided as their reminder.  Patient and daughter denied any health management needs or concerns at this time.  For additional questions please contact:  Edwena Felty A. Melena Hayes, BSN, RN-BC [facility] PRIMARY CARE Navigator Cell: [phone]

## 2016-07-20 NOTE — Evaluation (Signed)
Speech Language Pathology Evaluation Patient Details Name: Catherine [name] MRN: [id number] DOB: 1945-08-25 Today's Date: 07/20/2016 Time: 0735-0809 SLP Time Calculation (min) (ACUTE ONLY): 34 min  Problem List:  Patient Active Problem List   Diagnosis Date Noted  . CVA (cerebral vascular accident) ([place]) 07/19/2016  . HLD (hyperlipidemia) 08/14/2014  . Obesity (BMI 30-39.9) 08/17/2013  . Hyperlipidemia LDL goal < 100 02/15/2013  . TIA (transient ischemic attack) 12/14/2012  . Essential hypertension, benign 12/14/2012  . Other and unspecified hyperlipidemia 12/14/2012   Past Medical History:  Past Medical History:  Diagnosis Date  . Arthritis   . Cataract   . GERD (gastroesophageal reflux disease)   . TIA (transient ischemic attack)    Past Surgical History:  Past Surgical History:  Procedure Laterality Date  . EYE SURGERY     HPI:  Catherine Twitty Hortonis a 71 y.o.femalewith a past medical history of TIA, hyperlipidemia, who denies any history of hypertension, diabetes, who was in her usual state of health until this morning when she woke up and felt funny in her throat. Presented with left-sided facial droop, unsteady gait, numbness in face and legs. CT with no acute findings, MRI pending. RN completed stroke swallow screen, noted concerns re: coughing with thin liquids after straw sips. Seen for bedside swallowing evaluation.    Assessment / Plan / Recommendation Clinical Impression  Pt presents with mild dysarthria and left facial weakness as well as mild cognitive deficits per MOCA.  She is intelligible but has imprecise articulation with complex consonants mostly.   She also admits to her voice being "lower pitch" and not as strong as normal.  MOCA scoring was 25/30 indicating mild deficits; areas of strength include executive function/visuospatial, abstract thought.  Challenges include memory recall - 3/5 words independent, 2/5 with category cue and verbal fluency - naming only 3  words in 60 seconds.  Recommend follow up SLP to assure pt maximizes rehab as pt was completely independent prior to admission.  Educated pt and daughter to recommendations.  Will follow for functional cognitive linguistic treatment.      SLP Assessment  SLP Recommendation/Assessment: Patient needs continued Speech Lanaguage Pathology Services SLP Visit Diagnosis: Cognitive communication deficit (R41.841)    Follow Up Recommendations  Outpatient SLP;Home health SLP (vs)    Frequency and Duration min 1 x/week  1 week      SLP Evaluation Cognition  Arousal/Alertness: Awake/alert Orientation Level: Oriented X4 Attention: Sustained Sustained Attention: Impaired Sustained Attention Impairment: Verbal complex Memory: Impaired Memory Impairment: Retrieval deficit Awareness: Appears intact Problem Solving: Appears intact Safety/Judgment: Appears intact       Comprehension  Auditory Comprehension Overall Auditory Comprehension: Appears within functional limits for tasks assessed Yes/No Questions: Not tested Commands: Within Functional Limits Conversation: Complex Visual Recognition/Discrimination Discrimination: Not tested Reading Comprehension Reading Status: Not tested    Expression Expression Primary Mode of Expression: Verbal Verbal Expression Overall Verbal Expression: Impaired Initiation: No impairment Level of Generative/Spontaneous Verbalization: Conversation Repetition: Impaired Level of Impairment: Sentence level (at complex sentence level - ) Naming: No impairment Pragmatics: No impairment Interfering Components: Attention Written Expression Dominant Hand: Right Written Expression: Within Functional Limits   Oral / Motor  Oral Motor/Sensory Function Overall Oral Motor/Sensory Function: Mild impairment Facial ROM: Reduced left Facial Symmetry: Abnormal symmetry left Facial Strength: Reduced left Facial Sensation: Reduced left Lingual ROM: Within Functional  Limits Lingual Symmetry: Within Functional Limits Lingual Strength: Within Functional Limits Lingual Sensation: Within Functional Limits Velum: Within Functional Limits Mandible: Within  Functional Limits Motor Speech Overall Motor Speech: Appears within functional limits for tasks assessed Respiration: Within functional limits Phonation: Normal Articulation: Impaired Level of Impairment: Word (complex consonant sounds) Intelligibility: Intelligible Motor Planning: Impaired Motor Speech Errors: Not applicable Effective Techniques: Slow rate   GO                    Catherine [name], [place] [facility] SLP [phone]

## 2016-07-20 NOTE — Progress Notes (Signed)
PROGRESS NOTE   Catherine [name]  HAL:[id number]    DOB: [birth year]    DOA: 07/19/2016  PCP: Dillard Essex (Inactive)   I have briefly reviewed patients previous medical records in [facility].  Brief Narrative:  70 year old female, lives alone [place], physically independent and very active, PMH of TIA, HLD, GERD, presented to ED after she woke up on day of admission with a funny feeling/numbness in her throat, generalized weakness & fatigued, unsteadiness but proceeded with her activities until the evening when family noted facial droop, worsening unsteady gait, and numbness/weakness left extremities. Initial CT was negative for stroke but MRI confirmed right basal ganglia stroke. Neurology consulting.   Assessment & Plan:   Principal Problem:   TIA (transient ischemic attack) Active Problems:   HLD (hyperlipidemia)   CVA (cerebral vascular accident) ([place])   Acute stroke: Right basal ganglia infarct. - Resultant left facial droop, left hemiparesis and ataxia - CT head 3/25: No acute intracranial abnormality. No definite acute cortical infarction. - MRI brain: Patchy small volume acute ischemic nonhemorrhagic right basal ganglia infarct. Small remote left thalamic lacunar infarct. - CT head and neck 3/25: Single short segment severe left A2 stenosis. Hypoplastic left vertebral artery. Fetal-type right PCA. Hypoplastic/absent right A1 segment with anterior cerebral artery supplied via the left ICA system. - 2-D echo pending - Carotid Dopplers: Not needed. See CTA head and neck. - LDL 103 - A1c pending - Patient was on aspirin 81 MG daily prior to admission. Now on Plavix 75 MG daily for secondary stroke prophylaxis. - Therapies evaluation: Recommend outpatient versus home health SLP. Rest pending. - Neuro hospitalist consultation appreciated. Stroke team follow-up pending.  Elevated blood pressure, possible chronic hypertension - Patient denies prior  history of HTN. Allow permissive hypertension in the context of acute stroke and normalize after 5-7 days.  Hyperlipidemia - LDL 103, goal <70. Consider increasing simvastatin or changing to atorvastatin.    DVT prophylaxis: Lovenox Code Status: Full Family Communication: Discussed in detail with patient's youngest daughter who is a physical therapist, at bedside. Updated care and answered questions. Disposition: To be determined pending therapy evaluation and completion of stroke workup and neurology recommendations.   Consultants:  Neurology   Procedures:  None  Antimicrobials:  None    Subjective: Feels slightly better. No further abnormal sensations in the throat. Facial drooping slightly better. Complaints of mild left-sided weakness but slightly improved compared to admission.   ROS: No chest pain, palpitations, dizziness or lightheadedness.  Objective:  Vitals:   07/20/16 0230 07/20/16 0430 07/20/16 0630 07/20/16 0830  BP: (!) 147/73 (!) 156/74 (!) 168/76 (!) 130/57  Pulse: (!) 58 60 62 73  Resp: 16 16 16 16   Temp: 98 F (36.7 C) 98.6 F (37 C) 98.6 F (37 C) 98.6 F (37 C)  TempSrc: Oral Oral Oral Oral  SpO2: 98% 99% 96% 98%  Weight:      Height:        Examination:  General exam: Pleasant elderly female sitting up comfortably in chair this morning. Respiratory system: Clear to auscultation. Respiratory effort normal. Cardiovascular system: S1 & S2 heard, RRR. No JVD, murmurs, rubs, gallops or clicks. No pedal edema. Telemetry: Sinus bradycardia in the 50s-sinus rhythm.  Gastrointestinal system: Abdomen is nondistended, soft and nontender. No organomegaly or masses felt. Normal bowel sounds heard. Central nervous system: Alert and oriented. Left facial droop. Low-tone voice but no obvious dysarthria. Extremities: Right limbs grade 5 x 5  power. Left limbs grade 4+ by 5 power. Left pronator drift. Skin: No rashes, lesions or ulcers Psychiatry: Judgement  and insight appear normal. Mood & affect appropriate.     Data Reviewed: I have personally reviewed following labs and imaging studies  CBC:  Recent Labs Lab 07/19/16 1508 07/19/16 1522 07/20/16 0514  WBC 6.0  --  5.1  NEUTROABS 3.2  --   --   HGB 14.0 13.9 12.9  HCT 42.0 41.0 39.5  MCV 89.0  --  89.0  PLT 266  --  517   Basic Metabolic Panel:  Recent Labs Lab 07/19/16 1508 07/19/16 1522 07/20/16 0514  NA 140 141 140  K 3.8 3.9 4.3  CL 103 101 105  CO2 27  --  23  GLUCOSE 111* 113* 89  BUN 9 11 10   CREATININE 0.78 0.80 0.66  CALCIUM 9.7  --  8.9   Liver Function Tests:  Recent Labs Lab 07/19/16 1508 07/20/16 0514  AST 22 23  ALT 16 16  ALKPHOS 83 77  BILITOT 1.4* 1.4*  PROT 7.3 6.4*  ALBUMIN 4.2 3.8   Coagulation Profile:  Recent Labs Lab 07/19/16 1508  INR 1.00   Cardiac Enzymes: No results for input(s): CKTOTAL, CKMB, CKMBINDEX, TROPONINI in the last 168 hours. HbA1C: No results for input(s): HGBA1C in the last 72 hours. CBG:  Recent Labs Lab 07/19/16 1510  GLUCAP 129*    No results found for this or any previous visit (from the past 240 hour(s)).       Radiology Studies: Ct Angio Head W Or Wo Contrast  Result Date: 07/20/2016 CLINICAL DATA:  Initial evaluation for headache with right-sided weakness and numbness. EXAM: CT ANGIOGRAPHY HEAD AND NECK TECHNIQUE: Multidetector CT imaging of the head and neck was performed using the standard protocol during bolus administration of intravenous contrast. Multiplanar CT image reconstructions and MIPs were obtained to evaluate the vascular anatomy. Carotid stenosis measurements (when applicable) are obtained utilizing NASCET criteria, using the distal internal carotid diameter as the denominator. CONTRAST:  50 cc of Isovue 370. COMPARISON:  Prior CT from earlier same day. FINDINGS: CTA NECK FINDINGS Aortic arch: Visualized aortic arch of normal caliber with normal branch pattern. Minimal plaque  noted within the arch itself. No high-grade stenosis about the origin of the great vessels. Visualized subclavian arteries widely patent. Right carotid system: Right common carotid artery patent from its origin to the bifurcation. Right ICA widely patent from the bifurcation to the skullbase. No stenosis, dissection, or vascular occlusion within the right carotid artery system. Left carotid system: Left common carotid artery patent from its origin to the bifurcation. No significant narrowing about the left bifurcation. Left ICA widely patent from the bifurcation to the skullbase. No stenosis, dissection, or vascular occlusion within the left carotid artery system. Vertebral arteries: Both of the vertebral arteries arise from the subclavian arteries. Right vertebral artery dominant. Left vertebral artery diffusely hypoplastic. Vertebral arteries patent without stenosis, dissection, or occlusion. Skeleton: No acute osseous abnormality. No worrisome lytic or blastic osseous lesions. Multilevel degenerative spondylolysis, greatest at C5-6. Other neck: Soft tissues of the neck demonstrate no acute abnormality. Asymmetric enlargement of the right lobe of thyroid noted. No adenopathy. Upper chest: Visualized upper mediastinum within normal limits. Visualized lungs are clear. Review of the MIP images confirms the above findings CTA HEAD FINDINGS Anterior circulation: Petrous segments widely patent bilaterally. Minimal atheromatous plaque within the cavernous ICAs without flow-limiting stenosis. Supraclinoid segments widely patent. ICA termini patent. Left  A1 segment widely patent. Right A1 segment hypoplastic/absent. Anterior communicating artery normal. Anterior cerebral arteries patent to their distal aspects. There is a focal high-grade stenosis within the distal left A2 branch (series 506, image 19). M1 segments widely patent without stenosis or occlusion. No proximal M2 occlusion. Distal MCA branches well opacified and  symmetric. Posterior circulation: Dominant right vertebral artery widely patent to the vertebrobasilar junction. Moderate smooth narrowing of the hypoplastic left V4 segment (series 502, image 157). Left vertebral artery largely terminates in PICA, although a tiny branch adjacent towards the vertebrobasilar junction. Basilar artery widely patent. Superior cerebral arteries patent bilaterally. Left PCA supplied via the basilar and is widely patent to its distal aspect. Fetal type right PCA supplied via a widely patent right posterior communicating artery. Right PCA also patent to its distal aspect. Venous sinuses: Patent. Anatomic variants: Fetal type right PCA. No aneurysm or vascular malformation. Delayed phase: No pathologic enhancement. Review of the MIP images confirms the above findings IMPRESSION: CTA NECK IMPRESSION: Negative CTA of the neck. No significant atheromatous disease for patient age. No high-grade or flow-limiting stenosis. CTA HEAD IMPRESSION: 1. Single short-segment severe left A2 stenosis. 2. Otherwise negative CTA. No other high-grade or correctable stenosis. 3. 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 Dominant right vertebral artery widely patent to the vertebral basilar junction. Fetal type right PCA. 4. Hypoplastic/absent right A1 segment, with the anterior cerebral arteries supplied via the left internal carotid artery system. Electronically Signed   By: Jeannine Boga M.D.   On: 07/20/2016 01:46   Ct Head Wo Contrast  Result Date: 07/19/2016 CLINICAL DATA:  Weakness, facial droop EXAM: CT HEAD WITHOUT CONTRAST TECHNIQUE: Contiguous axial images were obtained from the base of the skull through the vertex without intravenous contrast. COMPARISON:  12/14/2012 FINDINGS: Brain: No intracranial hemorrhage, mass effect or midline shift. Minimal cerebral atrophy. Mild periventricular and patchy subcortical white matter decreased attenuation probable due to chronic  small vessel ischemic changes. No definite acute cortical infarction. Vascular: Minimal atherosclerotic calcifications of carotid siphon Skull: No skull fracture. Sinuses/Orbits: No acute findings. Other: None IMPRESSION: No acute intracranial abnormality. Minimal cerebral atrophy. Mild periventricular and patchy subcortical white matter decreased attenuation probable due to chronic small vessel ischemic changes. No definite acute cortical infarction. Electronically Signed   By: Lahoma Crocker M.D.   On: 07/19/2016 15:48   Ct Angio Neck W Or Wo Contrast  Result Date: 07/20/2016 CLINICAL DATA:  Initial evaluation for headache with right-sided weakness and numbness. EXAM: CT ANGIOGRAPHY HEAD AND NECK TECHNIQUE: Multidetector CT imaging of the head and neck was performed using the standard protocol during bolus administration of intravenous contrast. Multiplanar CT image reconstructions and MIPs were obtained to evaluate the vascular anatomy. Carotid stenosis measurements (when applicable) are obtained utilizing NASCET criteria, using the distal internal carotid diameter as the denominator. CONTRAST:  50 cc of Isovue 370. COMPARISON:  Prior CT from earlier same day. FINDINGS: CTA NECK FINDINGS Aortic arch: Visualized aortic arch of normal caliber with normal branch pattern. Minimal plaque noted within the arch itself. No high-grade stenosis about the origin of the great vessels. Visualized subclavian arteries widely patent. Right carotid system: Right common carotid artery patent from its origin to the bifurcation. Right ICA widely patent from the bifurcation to the skullbase. No stenosis, dissection, or vascular occlusion within the right carotid artery system. Left carotid system: Left common carotid artery patent from its origin to the bifurcation. No significant narrowing about the left bifurcation. Left  ICA widely patent from the bifurcation to the skullbase. No stenosis, dissection, or vascular occlusion within  the left carotid artery system. Vertebral arteries: Both of the vertebral arteries arise from the subclavian arteries. Right vertebral artery dominant. Left vertebral artery diffusely hypoplastic. Vertebral arteries patent without stenosis, dissection, or occlusion. Skeleton: No acute osseous abnormality. No worrisome lytic or blastic osseous lesions. Multilevel degenerative spondylolysis, greatest at C5-6. Other neck: Soft tissues of the neck demonstrate no acute abnormality. Asymmetric enlargement of the right lobe of thyroid noted. No adenopathy. Upper chest: Visualized upper mediastinum within normal limits. Visualized lungs are clear. Review of the MIP images confirms the above findings CTA HEAD FINDINGS Anterior circulation: Petrous segments widely patent bilaterally. Minimal atheromatous plaque within the cavernous ICAs without flow-limiting stenosis. Supraclinoid segments widely patent. ICA termini patent. Left A1 segment widely patent. Right A1 segment hypoplastic/absent. Anterior communicating artery normal. Anterior cerebral arteries patent to their distal aspects. There is a focal high-grade stenosis within the distal left A2 branch (series 506, image 19). M1 segments widely patent without stenosis or occlusion. No proximal M2 occlusion. Distal MCA branches well opacified and symmetric. Posterior circulation: Dominant right vertebral artery widely patent to the vertebrobasilar junction. Moderate smooth narrowing of the hypoplastic left V4 segment (series 502, image 157). Left vertebral artery largely terminates in PICA, although a tiny branch adjacent towards the vertebrobasilar junction. Basilar artery widely patent. Superior cerebral arteries patent bilaterally. Left PCA supplied via the basilar and is widely patent to its distal aspect. Fetal type right PCA supplied via a widely patent right posterior communicating artery. Right PCA also patent to its distal aspect. Venous sinuses: Patent. Anatomic  variants: Fetal type right PCA. No aneurysm or vascular malformation. Delayed phase: No pathologic enhancement. Review of the MIP images confirms the above findings IMPRESSION: CTA NECK IMPRESSION: Negative CTA of the neck. No significant atheromatous disease for patient age. No high-grade or flow-limiting stenosis. CTA HEAD IMPRESSION: 1. Single short-segment severe left A2 stenosis. 2. Otherwise negative CTA. No other high-grade or correctable stenosis. 3. 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 Dominant right vertebral artery widely patent to the vertebral basilar junction. Fetal type right PCA. 4. Hypoplastic/absent right A1 segment, with the anterior cerebral arteries supplied via the left internal carotid artery system. Electronically Signed   By: Jeannine Boga M.D.   On: 07/20/2016 01:46   Mr Brain Wo Contrast  Result Date: 07/20/2016 CLINICAL DATA:  Evaluation for slurred speech, right facial droop. EXAM: MRI HEAD WITHOUT CONTRAST TECHNIQUE: Multiplanar, multiecho pulse sequences of the brain and surrounding structures were obtained without intravenous contrast. COMPARISON:  Prior CTA from 07/19/2016. FINDINGS: Brain: Diffuse prominence of the CSF containing spaces compatible with generalized cerebral atrophy. Patchy and confluent T2/FLAIR hyperintensity within the periventricular and deep white matter both cerebral hemispheres most compatible chronic microvascular disease, mild for age. Remote lacunar infarct present within the left thalamus. There is patchy abnormal restricted diffusion involving the posterior right lentiform nucleus extending towards the periventricular white matter of the right corona radiata (series 4, image 29). No associated hemorrhage or mass effect. No other evidence for acute or subacute ischemia. No other evidence for acute or chronic intracranial hemorrhage. No other areas of chronic infarction identified. No mass lesion, midline shift or mass  effect. Ventricles normal in size without evidence for hydrocephalus. No extra-axial fluid collection. Major dural sinuses are grossly patent. Pituitary gland within normal limits. Vascular: Major intracranial vascular flow voids are maintained. Skull and upper cervical spine:  Craniocervical junction within normal limits. Visualized upper cervical spine unremarkable. Bone marrow signal intensity within normal limits. No scalp soft tissue abnormality. Sinuses/Orbits: Globes and orbital soft tissues within normal limits. Patient status post lens extraction bilaterally. Scattered mucosal thickening within the ethmoidal air cells and maxillary sinuses. Paranasal sinuses are otherwise clear. Small bilateral mastoid effusions, right greater than left. Inner ear structures normal. Other: No other significant finding. IMPRESSION: 1. Patchy small volume acute ischemic nonhemorrhagic right basal ganglia infarct. 2. Small remote left thalamic lacunar infarct. 3. Generalized age-related cerebral atrophy with mild chronic microvascular ischemic disease. Electronically Signed   By: Jeannine Boga M.D.   On: 07/20/2016 03:21        Scheduled Meds: . clopidogrel  75 mg Oral Daily  . enoxaparin (LOVENOX) injection  40 mg Subcutaneous Q24H  . simvastatin  20 mg Oral QHS   Continuous Infusions:   LOS: 1 day     Catherine Cerutti, MD, FACP, FHM. [facility] Pager [phone]  If 7PM-7AM, please contact night-coverage [email or web address] Password [facility] 07/20/2016, 11:54 AM

## 2016-07-20 NOTE — Progress Notes (Signed)
Routine adult EEG completed, results pending. 

## 2016-07-20 NOTE — Progress Notes (Signed)
OT Cancellation Note  Patient Details Name: TIRZA SENTENO MRN: [id number] DOB: 11-20-1945   Cancelled Treatment:    Reason Eval/Treat Not Completed: Patient at procedure or test/ unavailable. Pt at EEG at this time. Will check back for OT evaluation as able.  Norman Herrlich, MS OTR/L  Pager: [phone]   Norman Herrlich 07/20/2016, 4:06 PM

## 2016-07-20 NOTE — Progress Notes (Signed)
STROKE TEAM PROGRESS NOTE   HISTORY OF PRESENT ILLNESS (per record) This is a 71 year old woman who presented to the emergency department because of difficulty speaking and walking. History is obtained directly from the patient. Her daughters are present at the bedside and offer additional information as needed.  Patient reports that earlier today, she felt extremely tired. She also had a headache and felt as if she was speaking more slowly than normal. She was able to go to church and sing with the choir without any difficulty. When she was stepping down from the choir, she felt as if she is having difficulty walking. She felt unsteady and thinks that her right leg was weak. At some point, her daughter noticed that she had a left facial droop and possible weakness of the left arm. The patient herself is unaware of these issues. She was brought to the emergency department for further evaluation. The ED physician noted a normal examination. The admitting hospitalist noted normal strength with dysdiadochokinesis on the left arm. She is now being admitted for further evaluation and neurology consultation is requested for recommendations.  Currently, she is not endorsing any symptoms apart from ongoing sensory changes in the right side of her face and a mild headache. She has a reported history of prior TIA in 2014 and states that symptoms at the time were similar to today's. She takes aspirin 81 mg daily and simvastatin 20 mg daily. Patient was not administered IV t-PA.    SUBJECTIVE (INTERVAL HISTORY) Pt daughter is at bedside. Daughter stated that pt recently has a lot of stress from family, from her other daughter and grandson. Pt still has left facial droop and mild left hand and left foot weakness. Otherwise, neuro stable. Questionable OSA s/s.    OBJECTIVE Temp:  [98 F (36.7 C)-98.8 F (37.1 C)] 98.6 F (37 C) (03/26 0830) Pulse Rate:  [58-86] 73 (03/26 0830) Cardiac Rhythm: Sinus  bradycardia;Heart block (03/26 0700) Resp:  [14-24] 16 (03/26 0830) BP: (130-171)/(57-96) 130/57 (03/26 0830) SpO2:  [96 %-99 %] 98 % (03/26 0830) Weight:  [77.1 kg (170 lb)-79.9 kg (176 lb 2.4 oz)] 79.9 kg (176 lb 2.4 oz) (03/25 2030)  CBC:  Recent Labs Lab 07/19/16 1508 07/19/16 1522 07/20/16 0514  WBC 6.0  --  5.1  NEUTROABS 3.2  --   --   HGB 14.0 13.9 12.9  HCT 42.0 41.0 39.5  MCV 89.0  --  89.0  PLT 266  --  585    Basic Metabolic Panel:  Recent Labs Lab 07/19/16 1508 07/19/16 1522 07/20/16 0514  NA 140 141 140  K 3.8 3.9 4.3  CL 103 101 105  CO2 27  --  23  GLUCOSE 111* 113* 89  BUN 9 11 10   CREATININE 0.78 0.80 0.66  CALCIUM 9.7  --  8.9    Lipid Panel:    Component Value Date/Time   CHOL 193 07/20/2016 0514   TRIG 219 (H) 07/20/2016 0514   HDL 46 07/20/2016 0514   CHOLHDL 4.2 07/20/2016 0514   VLDL 44 (H) 07/20/2016 0514   LDLCALC 103 (H) 07/20/2016 0514   HgbA1c:  Lab Results  Component Value Date   HGBA1C 5.7 (H) 12/14/2012   Urine Drug Screen:    Component Value Date/Time   LABOPIA NONE DETECTED 12/14/2012 1642   COCAINSCRNUR NONE DETECTED 12/14/2012 1642   LABBENZ NONE DETECTED 12/14/2012 1642   AMPHETMU NONE DETECTED 12/14/2012 1642   THCU NONE DETECTED 12/14/2012 1642  LABBARB NONE DETECTED 12/14/2012 1642      IMAGING I have personally reviewed the radiological images below and agree with the radiology interpretations.  Ct Angio Head and neck W Or Wo Contrast 07/20/2016 IMPRESSION: CTA NECK IMPRESSION: Negative CTA of the neck. No significant atheromatous disease for patient age. No high-grade or flow-limiting stenosis. CTA HEAD IMPRESSION: 1. Single short-segment severe left A2 stenosis. 2. Otherwise negative CTA. No other high-grade or correctable stenosis. 3. 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 Dominant right vertebral artery widely patent to the vertebral basilar junction. Fetal type right PCA. 4.  Hypoplastic/absent right A1 segment, with the anterior cerebral arteries supplied via the left internal carotid artery system.   Ct Head Wo Contrast 07/19/2016 IMPRESSION: No acute intracranial abnormality. Minimal cerebral atrophy. Mild periventricular and patchy subcortical white matter decreased attenuation probable due to chronic small vessel ischemic changes. No definite acute cortical infarction.   Mr Brain Wo Contrast 07/20/2016 IMPRESSION: 1. Patchy small volume acute ischemic nonhemorrhagic right basal ganglia infarct. 2. Small remote left thalamic lacunar infarct. 3. Generalized age-related cerebral atrophy with mild chronic microvascular ischemic disease.   TTE pending   PHYSICAL EXAM  Temp:  [98 F (36.7 C)-98.8 F (37.1 C)] 98.6 F (37 C) (03/26 0830) Pulse Rate:  [58-86] 73 (03/26 0830) Resp:  [14-24] 16 (03/26 0830) BP: (130-171)/(57-96) 130/57 (03/26 0830) SpO2:  [96 %-99 %] 98 % (03/26 0830) Weight:  [170 lb (77.1 kg)-176 lb 2.4 oz (79.9 kg)] 176 lb 2.4 oz (79.9 kg) (03/25 2030)  General - Well nourished, well developed, in no apparent distress.  Ophthalmologic - Fundi not visualized due to small pupils.  Cardiovascular - Regular rate and rhythm.  Mental Status -  Level of arousal and orientation to time, place, and person were intact. Language including expression, naming, repetition, comprehension was assessed and found intact. Fund of Knowledge was assessed and was intact.  Cranial Nerves II - XII - II - Visual field intact OU. III, IV, VI - Extraocular movements intact. V - Facial sensation intact bilaterally. VII - mild left facial droop. VIII - Hearing & vestibular intact bilaterally. X - Palate elevates symmetrically. XI - Chin turning & shoulder shrug intact bilaterally. XII - Tongue protrusion intact.  Motor Strength - The patient's strength was normal in all extremities except left hand dexterity difficulty and left foot 4+/5 DF and pronator  drift was present.  Bulk was normal and fasciculations were absent.   Motor Tone - Muscle tone was assessed at the neck and appendages and was normal.  Reflexes - The patient's reflexes were 1+ in all extremities and she had no pathological reflexes.  Sensory - Light touch, temperature/pinprick were assessed and were symmetrical.    Coordination - The patient had normal movements in the hands and feet with no ataxia or dysmetria.  Tremor was absent.  Gait and Station - deferred   ASSESSMENT/PLAN Ms. CHARLIEGH VASUDEVAN is a 71 y.o. female with history of prior TIA, GERD, HLD and arthritis presenting with headache, R face numbness, RLE weakness, L face/arm weakness, speech changes. She did not receive IV t-PA.   Stroke:  right BG/CR infarct secondary to small vessel disease source  Resultant  Left facial droop, left hand and foot mild weakness  MRI  Right BG/CR lacunar infarct  CTA head and neck - left A2 stenosis (chronic)  2D Echo  Pending  EEG pending  LDL 103  HgbA1c pending  Lovenox 40 mg sq daily for  VTE prophylaxis  Diet Heart Room service appropriate? Yes; Fluid consistency: Thin  aspirin 81 mg daily prior to admission, now on clopidogrel 75 mg daily. Continue plavix on discharge.  Patient counseled to be compliant with her antithrombotic medications  Ongoing aggressive stroke risk factor management  Therapy recommendations:  pending  Disposition:  pending  Hx stroke/TIA  11/2012 - headache, slurry speech and dizziness. MRI negative, MRA left ACA stenosis - LDL 130 - on ASA and lipitor  Later pt is on ASA and zocor  Elevated BP  No hx hypertension, not on home medications  BP 150s/70s  Monitor   Hyperlipidemia  Home meds:  zocor 20 and omega 3, resumed in hospital  LDL 103, goal < 70  Consider increasing statin dose to 40mg   Continue statin at discharge  Other Stroke Risk Factors  Advanced age  Obesity, Body mass index is 31.2 kg/m.,  recommend weight loss, diet and exercise as appropriate   Family hx stroke (mother)  Hospital day # 1  Rosalin Hawking, MD PhD Stroke Neurology 07/20/2016 12:01 PM   To contact Stroke Continuity provider, please refer to [email or web address] After hours, contact General Neurology

## 2016-07-20 NOTE — Progress Notes (Signed)
SLE completed, full report to follow.  Catherine [name], [facility] SLP [phone]

## 2016-07-21 ENCOUNTER — Inpatient Hospital Stay (HOSPITAL_COMMUNITY)
Admission: RE | Admit: 2016-07-21 | Discharge: 2016-07-29 | DRG: 057 | Disposition: A | Discharge status: Home Health | Payer: Medicare Other | Source: Intra-hospital | Attending: Physical Medicine & Rehabilitation | Admitting: Physical Medicine & Rehabilitation

## 2016-07-21 ENCOUNTER — Inpatient Hospital Stay (HOSPITAL_COMMUNITY): Payer: Medicare Other

## 2016-07-21 ENCOUNTER — Encounter (HOSPITAL_COMMUNITY): Payer: Self-pay | Admitting: Physical Medicine and Rehabilitation

## 2016-07-21 DIAGNOSIS — G8191 Hemiplegia, unspecified affecting right dominant side: Secondary | ICD-10-CM | POA: Diagnosis not present

## 2016-07-21 DIAGNOSIS — I69322 Dysarthria following cerebral infarction: Secondary | ICD-10-CM

## 2016-07-21 DIAGNOSIS — R21 Rash and other nonspecific skin eruption: Secondary | ICD-10-CM | POA: Diagnosis not present

## 2016-07-21 DIAGNOSIS — K219 Gastro-esophageal reflux disease without esophagitis: Secondary | ICD-10-CM | POA: Diagnosis present

## 2016-07-21 DIAGNOSIS — R1310 Dysphagia, unspecified: Secondary | ICD-10-CM | POA: Diagnosis present

## 2016-07-21 DIAGNOSIS — I69391 Dysphagia following cerebral infarction: Secondary | ICD-10-CM

## 2016-07-21 DIAGNOSIS — Z7982 Long term (current) use of aspirin: Secondary | ICD-10-CM | POA: Diagnosis not present

## 2016-07-21 DIAGNOSIS — I69392 Facial weakness following cerebral infarction: Secondary | ICD-10-CM

## 2016-07-21 DIAGNOSIS — I639 Cerebral infarction, unspecified: Secondary | ICD-10-CM | POA: Diagnosis present

## 2016-07-21 DIAGNOSIS — I6300 Cerebral infarction due to thrombosis of unspecified precerebral artery: Secondary | ICD-10-CM

## 2016-07-21 DIAGNOSIS — I69354 Hemiplegia and hemiparesis following cerebral infarction affecting left non-dominant side: Secondary | ICD-10-CM | POA: Diagnosis not present

## 2016-07-21 DIAGNOSIS — G4700 Insomnia, unspecified: Secondary | ICD-10-CM | POA: Diagnosis present

## 2016-07-21 DIAGNOSIS — G8194 Hemiplegia, unspecified affecting left nondominant side: Secondary | ICD-10-CM | POA: Diagnosis not present

## 2016-07-21 DIAGNOSIS — Z823 Family history of stroke: Secondary | ICD-10-CM | POA: Diagnosis not present

## 2016-07-21 DIAGNOSIS — I6381 Other cerebral infarction due to occlusion or stenosis of small artery: Secondary | ICD-10-CM | POA: Diagnosis present

## 2016-07-21 DIAGNOSIS — E7800 Pure hypercholesterolemia, unspecified: Secondary | ICD-10-CM

## 2016-07-21 DIAGNOSIS — I10 Essential (primary) hypertension: Secondary | ICD-10-CM

## 2016-07-21 DIAGNOSIS — Z79899 Other long term (current) drug therapy: Secondary | ICD-10-CM | POA: Diagnosis not present

## 2016-07-21 DIAGNOSIS — E785 Hyperlipidemia, unspecified: Secondary | ICD-10-CM | POA: Diagnosis present

## 2016-07-21 DIAGNOSIS — G458 Other transient cerebral ischemic attacks and related syndromes: Secondary | ICD-10-CM

## 2016-07-21 DIAGNOSIS — I6789 Other cerebrovascular disease: Secondary | ICD-10-CM

## 2016-07-21 LAB — HEMOGLOBIN A1C
Hgb A1c MFr Bld: 5.4 % (ref 4.8–5.6)
MEAN PLASMA GLUCOSE: 108 mg/dL

## 2016-07-21 LAB — ECHOCARDIOGRAM COMPLETE
HEIGHTINCHES: 63 in
Weight: 2818.36 oz

## 2016-07-21 MED ORDER — CLOPIDOGREL BISULFATE 75 MG PO TABS: 75.0000 mg | ORAL_TABLET | Freq: Every day | ORAL | 1 refills | Status: DC

## 2016-07-21 MED ORDER — PROCHLORPERAZINE EDISYLATE 5 MG/ML IJ SOLN: 5.0000 mg | Freq: Four times a day (QID) | INTRAMUSCULAR | Status: DC | PRN

## 2016-07-21 MED ORDER — TRAZODONE HCL 50 MG PO TABS
25.0000 mg | ORAL_TABLET | Freq: Every evening | ORAL | Status: DC | PRN
  Administered 2016-07-21: 50 mg via ORAL
  Filled 2016-07-21: qty 1

## 2016-07-21 MED ORDER — GUAIFENESIN-DM 100-10 MG/5ML PO SYRP: 5.0000 mL | ORAL_SOLUTION | Freq: Four times a day (QID) | ORAL | Status: DC | PRN

## 2016-07-21 MED ORDER — SIMVASTATIN 20 MG PO TABS
40.0000 mg | ORAL_TABLET | Freq: Every day | ORAL | Status: DC
  Administered 2016-07-21 – 2016-07-28 (×8): 40 mg via ORAL
  Filled 2016-07-21 (×4): qty 2
  Filled 2016-07-21: qty 1
  Filled 2016-07-21 (×4): qty 2

## 2016-07-21 MED ORDER — POLYETHYLENE GLYCOL 3350 17 G PO PACK
17.0000 g | PACK | Freq: Every day | ORAL | Status: DC | PRN
  Administered 2016-07-23: 17 g via ORAL
  Filled 2016-07-21: qty 1

## 2016-07-21 MED ORDER — BISACODYL 10 MG RE SUPP: 10.0000 mg | Freq: Every day | RECTAL | Status: DC | PRN

## 2016-07-21 MED ORDER — SIMVASTATIN 40 MG PO TABS: 40.0000 mg | ORAL_TABLET | Freq: Every day | ORAL | 1 refills | Status: DC

## 2016-07-21 MED ORDER — DIPHENHYDRAMINE HCL 12.5 MG/5ML PO ELIX: 12.5000 mg | ORAL_SOLUTION | Freq: Four times a day (QID) | ORAL | Status: DC | PRN

## 2016-07-21 MED ORDER — ALUM & MAG HYDROXIDE-SIMETH 200-200-20 MG/5ML PO SUSP: 30.0000 mL | ORAL | Status: DC | PRN

## 2016-07-21 MED ORDER — FLEET ENEMA 7-19 GM/118ML RE ENEM: 1.0000 | ENEMA | Freq: Once | RECTAL | Status: DC | PRN

## 2016-07-21 MED ORDER — ENOXAPARIN SODIUM 40 MG/0.4ML ~~LOC~~ SOLN
40.0000 mg | SUBCUTANEOUS | Status: DC
  Administered 2016-07-21 – 2016-07-28 (×8): 40 mg via SUBCUTANEOUS
  Filled 2016-07-21 (×8): qty 0.4

## 2016-07-21 MED ORDER — CLOPIDOGREL BISULFATE 75 MG PO TABS
75.0000 mg | ORAL_TABLET | Freq: Every day | ORAL | Status: DC
  Administered 2016-07-22 – 2016-07-29 (×8): 75 mg via ORAL
  Filled 2016-07-21 (×8): qty 1

## 2016-07-21 MED ORDER — PROCHLORPERAZINE 25 MG RE SUPP: 12.5000 mg | Freq: Four times a day (QID) | RECTAL | Status: DC | PRN

## 2016-07-21 MED ORDER — ACETAMINOPHEN 325 MG PO TABS
325.0000 mg | ORAL_TABLET | ORAL | Status: DC | PRN
  Administered 2016-07-27: 650 mg via ORAL
  Filled 2016-07-21 (×2): qty 2

## 2016-07-21 MED ORDER — PROCHLORPERAZINE MALEATE 5 MG PO TABS: 5.0000 mg | ORAL_TABLET | Freq: Four times a day (QID) | ORAL | Status: DC | PRN

## 2016-07-21 NOTE — Care Management Note (Signed)
Case Management Note  Patient Details  Name: Catherine [name] MRN: [id number] Date of Birth: [birth year]  Subjective/Objective:                    Action/Plan: Pt discharging to [facility] today. No further needs per CM.   Expected Discharge Date:  07/21/16               Expected Discharge Plan:  [place]  In-House Referral:     Discharge planning Services     Post Acute Care Choice:    Choice offered to:     DME Arranged:    DME Agency:     HH Arranged:    HH Agency:     Status of Service:  Completed, signed off  If discussed at [employer], dates discussed:    Additional Comments:  Pollie Friar, RN 07/21/2016, 2:20 PM

## 2016-07-21 NOTE — H&P (Signed)
Physical Medicine and Rehabilitation Admission H&P    Chief Complaint  Patient presents with  . Left sided weakness with difficulty walking  . Left facial droop with slurred speech    HPI:  Catherine Sottile Hortonis a 71 y.o.RH femalewith history of TIA 2014, OA , GERD who was admitted on 07/19/2016 with unsteady gait, left-sided weakness and speech difficulty. MRI brain done revealing small right basal ganglia infarct with remote left thalamic lacunar infract. EEG without evidence of seizure.  2 D echo done today. CTA head/neck revealed short segment severe left A2 segment. Dr. Erlinda Hong recommends changing ASA to plavix for stroke secondary to small vessel disease and and increasing statin dose.   Review of Systems  HENT: Negative for hearing loss and tinnitus.   Eyes: Negative for blurred vision and double vision.  Respiratory: Negative for cough and shortness of breath.   Cardiovascular: Negative for chest pain and palpitations.  Gastrointestinal: Positive for heartburn. Negative for constipation and diarrhea.  Genitourinary: Negative for dysuria and urgency.  Musculoskeletal: Positive for back pain (with heavy work) and myalgias.  Skin: Negative for itching and rash.  Neurological: Positive for sensory change, speech change, focal weakness, weakness and headaches (for past 2 months). Negative for dizziness.  Psychiatric/Behavioral: Negative for depression. The patient has insomnia (at times). The patient is not nervous/anxious.       Past Medical History:  Diagnosis Date  . Arthritis   . Cataract   . GERD (gastroesophageal reflux disease)   . TIA (transient ischemic attack)    Past Surgical History:  Procedure Laterality Date  . EYE SURGERY     Family History  Problem Relation Age of Onset  . Dementia Mother   . Cancer Mother   . Stroke Mother   . Cancer Father     Social History: Widowed. Retired. Daughter and grandson live with her.   reports that she has never smoked.  She has never used smokeless tobacco. She reports that she does not drink alcohol or use drugs.    Allergies: No Known Allergies    Medications Prior to Admission  Medication Sig Dispense Refill  . ALOE VERA PO Take 1 tablet by mouth daily as needed (INFLAMMATION).     Marland Kitchen aspirin EC 81 MG EC tablet Take 1 tablet (81 mg total) by mouth daily. 30 tablet 11  . Calcium Citrate (CITRACAL PO) Take 1 tablet by mouth daily.    Marland Kitchen CINNAMON PO Take 1 tablet by mouth daily.    . Cyanocobalamin (B-12 PO) Take 1 tablet by mouth daily.    . Ginkgo Biloba (GINKGO PO) Take 60 mg by mouth daily. Take 60 mg daily    . naproxen sodium (ANAPROX) 220 MG tablet Take 220 mg by mouth 2 (two) times daily as needed (PAIN).    Marland Kitchen OVER THE COUNTER MEDICATION Take 1 application by mouth daily as needed (NASAL RASH).    Marland Kitchen PROAIR HFA 108 (90 BASE) MCG/ACT inhaler Inhale 1-2 puffs into the lungs as needed for shortness of breath.     . simvastatin (ZOCOR) 20 MG tablet Take 20 mg by mouth at bedtime.    . TEA TREE OIL EX Apply 1 application topically daily as needed (FOR NASAL RASH).      Home: Home Living Family/patient expects to be discharged to:: Private residence Living Arrangements: Children Available Help at Discharge: Family, Available PRN/intermittently Type of Home: House Home Access: Stairs to enter [employer] of Steps: 1 small step  Home Layout: One level Bathroom Shower/Tub: Multimedia programmer: Handicapped height Home Equipment: Cane - single point, Environmental consultant - 2 wheels, Shower seat, Bedside commode  Lives With: Other (Comment) (daughter lives with her mother)   Functional History: Prior Function Level of Independence: Independent Comments: 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, etc; slipped on ice recently, but no injury; family report they can provide initial 32 hour assist ay d/c  Functional Status:  Mobility: Bed Mobility Overal bed mobility: Needs  Assistance Bed Mobility: Supine to Sit Supine to sit: Min guard General bed mobility comments: up in chair Transfers Overall transfer level: Needs assistance Equipment used: Rolling walker (2 wheeled) Transfers: Sit to/from Stand Sit to Stand: Min guard General transfer comment: steadying assist Ambulation/Gait Ambulation/Gait assistance: Min assist Ambulation Distance (Feet): 110 Feet Assistive device: Rolling walker (2 wheeled) Gait Pattern/deviations: Step-through pattern, Decreased stride length, Steppage, Drifts right/left, Wide base of support General Gait Details: over weight shifts to L and demonstrates instability esp with turns Gait velocity: slow pace    ADL: ADL Overall ADL's : Needs assistance/impaired Eating/Feeding: Sitting, Minimal assistance Eating/Feeding Details (indicate cue type and reason): Educated on checking for pocketing frequently due to L sided facial droop and decreased oral motor strength and sensation. Requires assistance to cut food and manipulate utensils. Grooming: Minimal assistance, Standing Upper Body Bathing: Sitting, Min guard Lower Body Bathing: Min guard, Sit to/from stand Upper Body Dressing : Minimal assistance, Sitting Lower Body Dressing: Sit to/from stand, Minimal assistance Toilet Transfer: Minimal assistance, Ambulation, RW, BSC Toilet Transfer Details (indicate cue type and reason): Simulated in room from bed to chair Toileting- Clothing Manipulation and Hygiene: Minimal assistance, Sit to/from stand Functional mobility during ADLs: Minimal assistance, Rolling walker General ADL Comments: Pt with difficulty donning glasses due to decreased L UE fine motor coordination.  Cognition: Cognition Overall Cognitive Status: No family/caregiver present to determine baseline cognitive functioning Arousal/Alertness: Awake/alert Orientation Level: Oriented X4 Attention: Sustained Sustained Attention: Impaired Sustained Attention  Impairment: Verbal complex Memory: Impaired Memory Impairment: Retrieval deficit Awareness: Appears intact Problem Solving: Appears intact Safety/Judgment: Appears intact Cognition Arousal/Alertness: Awake/alert Behavior During Therapy: WFL for tasks assessed/performed Overall Cognitive Status: No family/caregiver present to determine baseline cognitive functioning Area of Impairment: Problem solving Problem Solving: Slow processing General Comments: Required increased time for processing but was able to complete higher level cognitive tasks when increased time was provided. Plan to assess financial management tasks next session.  Physical Exam: Blood pressure (!) 127/59, pulse 71, temperature 98.7 F (37.1 C), temperature source Oral, resp. rate 18, height '5\' 3"'$  (1.6 m), weight 79.9 kg (176 lb 2.4 oz), SpO2 98 %. Physical Exam  Nursing note and vitals reviewed. Constitutional: She is oriented to person, place, and time. She appears well-developed and well-nourished.  HENT:  Head: Normocephalic and atraumatic.  Mouth/Throat: Oropharynx is clear and moist.  Eyes: Conjunctivae are normal. Pupils are equal, round, and reactive to light.  Neck: Normal range of motion. Neck supple.  Cardiovascular: Normal rate and regular rhythm.   Respiratory: Effort normal and breath sounds normal. No respiratory distress. She has no wheezes.  GI: Soft. Bowel sounds are normal. She exhibits no distension. There is no tenderness.  Musculoskeletal: She exhibits no edema or tenderness.  Neurological: She is alert and oriented to person, place, and time.  Left facial weakness with soft voice. Able to follow one and two step commands without difficulty. Sensory deficits LUE>LLE--improving.  Motor: LUE/LLE:  3-/5 deltoid, 3-/5  bicep, 3-/5 tricep, 3/5 wrist extension, 3-/5 hand intrinsics.      3/5 hip flexor, 3/5 knee extension, 4/5 ankle dorsiflexion, 4/5 ankle plantarflexion. Sensation 1+/2 LUE and LLE.       Skin: Skin is warm and dry. No rash noted.  Psychiatric: She has a normal mood and affect. Her behavior is normal. Judgment and thought content normal.    Results for orders placed or performed during the hospital encounter of 07/19/16 (from the past 48 hour(s))  Protime-INR     Status: None   Collection Time: 07/19/16  3:08 PM  Result Value Ref Range   Prothrombin Time 13.3 11.4 - 15.2 seconds   INR 1.00   APTT     Status: None   Collection Time: 07/19/16  3:08 PM  Result Value Ref Range   aPTT 32 24 - 36 seconds  CBC     Status: None   Collection Time: 07/19/16  3:08 PM  Result Value Ref Range   WBC 6.0 4.0 - 10.5 K/uL   RBC 4.72 3.87 - 5.11 MIL/uL   Hemoglobin 14.0 12.0 - 15.0 g/dL   HCT 42.0 36.0 - 46.0 %   MCV 89.0 78.0 - 100.0 fL   MCH 29.7 26.0 - 34.0 pg   MCHC 33.3 30.0 - 36.0 g/dL   RDW 13.6 11.5 - 15.5 %   Platelets 266 150 - 400 K/uL  Differential     Status: None   Collection Time: 07/19/16  3:08 PM  Result Value Ref Range   Neutrophils Relative % 53 %   Neutro Abs 3.2 1.7 - 7.7 K/uL   Lymphocytes Relative 36 %   Lymphs Abs 2.1 0.7 - 4.0 K/uL   Monocytes Relative 9 %   Monocytes Absolute 0.5 0.1 - 1.0 K/uL   Eosinophils Relative 2 %   Eosinophils Absolute 0.1 0.0 - 0.7 K/uL   Basophils Relative 0 %   Basophils Absolute 0.0 0.0 - 0.1 K/uL  Comprehensive metabolic panel     Status: Abnormal   Collection Time: 07/19/16  3:08 PM  Result Value Ref Range   Sodium 140 135 - 145 mmol/L   Potassium 3.8 3.5 - 5.1 mmol/L   Chloride 103 101 - 111 mmol/L   CO2 27 22 - 32 mmol/L   Glucose, Bld 111 (H) 65 - 99 mg/dL   BUN 9 6 - 20 mg/dL   Creatinine, Ser 0.78 0.44 - 1.00 mg/dL   Calcium 9.7 8.9 - 10.3 mg/dL   Total Protein 7.3 6.5 - 8.1 g/dL   Albumin 4.2 3.5 - 5.0 g/dL   AST 22 15 - 41 U/L   ALT 16 14 - 54 U/L   Alkaline Phosphatase 83 38 - 126 U/L   Total Bilirubin 1.4 (H) 0.3 - 1.2 mg/dL   GFR calc non Af Amer >60 >60 mL/min   GFR calc Af Amer >60 >60 mL/min     Comment: (NOTE) The eGFR has been calculated using the CKD EPI equation. This calculation has not been validated in all clinical situations. eGFR's persistently <60 mL/min signify possible Chronic Kidney Disease.    Anion gap 10 5 - 15  CBG monitoring, ED     Status: Abnormal   Collection Time: 07/19/16  3:10 PM  Result Value Ref Range   Glucose-Capillary 129 (H) 65 - 99 mg/dL  I-stat troponin, ED     Status: None   Collection Time: 07/19/16  3:20 PM  Result Value Ref Range  Troponin i, poc 0.00 0.00 - 0.08 ng/mL   Comment 3            Comment: Due to the release kinetics of cTnI, a negative result within the first hours of the onset of symptoms does not rule out myocardial infarction with certainty. If myocardial infarction is still suspected, repeat the test at appropriate intervals.   I-Stat Chem 8, ED     Status: Abnormal   Collection Time: 07/19/16  3:22 PM  Result Value Ref Range   Sodium 141 135 - 145 mmol/L   Potassium 3.9 3.5 - 5.1 mmol/L   Chloride 101 101 - 111 mmol/L   BUN 11 6 - 20 mg/dL   Creatinine, Ser 0.80 0.44 - 1.00 mg/dL   Glucose, Bld 113 (H) 65 - 99 mg/dL   Calcium, Ion 1.12 (L) 1.15 - 1.40 mmol/L   TCO2 29 0 - 100 mmol/L   Hemoglobin 13.9 12.0 - 15.0 g/dL   HCT 41.0 36.0 - 46.0 %  Hemoglobin A1c     Status: None   Collection Time: 07/20/16  5:14 AM  Result Value Ref Range   Hgb A1c MFr Bld 5.4 4.8 - 5.6 %    Comment: (NOTE)         Pre-diabetes: 5.7 - 6.4         Diabetes: >6.4         Glycemic control for adults with diabetes: <7.0    Mean Plasma Glucose 108 mg/dL    Comment: (NOTE) Performed At: [facility] [place], [place] [id number] Lindon Romp MD EX:[id number]   TSH     Status: None   Collection Time: 07/20/16  5:14 AM  Result Value Ref Range   TSH 3.190 0.350 - 4.500 uIU/mL    Comment: Performed by a 3rd Generation assay with a functional sensitivity of <=0.01 uIU/mL.  Lipid panel     Status:  Abnormal   Collection Time: 07/20/16  5:14 AM  Result Value Ref Range   Cholesterol 193 0 - 200 mg/dL   Triglycerides 219 (H) <150 mg/dL   HDL 46 >40 mg/dL   Total CHOL/HDL Ratio 4.2 RATIO   VLDL 44 (H) 0 - 40 mg/dL   LDL Cholesterol 103 (H) 0 - 99 mg/dL    Comment:        Total Cholesterol/HDL:CHD Risk Coronary Heart Disease Risk Table                     Men   Women  1/2 Average Risk   3.4   3.3  Average Risk       5.0   4.4  2 X Average Risk   9.6   7.1  3 X Average Risk  23.4   11.0        Use the calculated Patient Ratio above and the CHD Risk Table to determine the patient's CHD Risk.        ATP III CLASSIFICATION (LDL):  <100     mg/dL   Optimal  100-129  mg/dL   Near or Above                    Optimal  130-159  mg/dL   Borderline  160-189  mg/dL   High  >190     mg/dL   Very High   Vitamin B12     Status: None   Collection Time: 07/20/16  5:14 AM  Result  Value Ref Range   Vitamin B-12 389 180 - 914 pg/mL    Comment: (NOTE) This assay is not validated for testing neonatal or myeloproliferative syndrome specimens for Vitamin B12 levels.   CBC     Status: None   Collection Time: 07/20/16  5:14 AM  Result Value Ref Range   WBC 5.1 4.0 - 10.5 K/uL   RBC 4.44 3.87 - 5.11 MIL/uL   Hemoglobin 12.9 12.0 - 15.0 g/dL   HCT 39.5 36.0 - 46.0 %   MCV 89.0 78.0 - 100.0 fL   MCH 29.1 26.0 - 34.0 pg   MCHC 32.7 30.0 - 36.0 g/dL   RDW 13.6 11.5 - 15.5 %   Platelets 224 150 - 400 K/uL  Comprehensive metabolic panel     Status: Abnormal   Collection Time: 07/20/16  5:14 AM  Result Value Ref Range   Sodium 140 135 - 145 mmol/L   Potassium 4.3 3.5 - 5.1 mmol/L   Chloride 105 101 - 111 mmol/L   CO2 23 22 - 32 mmol/L   Glucose, Bld 89 65 - 99 mg/dL   BUN 10 6 - 20 mg/dL   Creatinine, Ser 0.66 0.44 - 1.00 mg/dL   Calcium 8.9 8.9 - 10.3 mg/dL   Total Protein 6.4 (L) 6.5 - 8.1 g/dL   Albumin 3.8 3.5 - 5.0 g/dL   AST 23 15 - 41 U/L   ALT 16 14 - 54 U/L   Alkaline  Phosphatase 77 38 - 126 U/L   Total Bilirubin 1.4 (H) 0.3 - 1.2 mg/dL   GFR calc non Af Amer >60 >60 mL/min   GFR calc Af Amer >60 >60 mL/min    Comment: (NOTE) The eGFR has been calculated using the CKD EPI equation. This calculation has not been validated in all clinical situations. eGFR's persistently <60 mL/min signify possible Chronic Kidney Disease.    Anion gap 12 5 - 15   Ct Angio Head W Or Wo Contrast  Result Date: 07/20/2016 CLINICAL DATA:  Initial evaluation for headache with right-sided weakness and numbness. EXAM: CT ANGIOGRAPHY HEAD AND NECK TECHNIQUE: Multidetector CT imaging of the head and neck was performed using the standard protocol during bolus administration of intravenous contrast. Multiplanar CT image reconstructions and MIPs were obtained to evaluate the vascular anatomy. Carotid stenosis measurements (when applicable) are obtained utilizing NASCET criteria, using the distal internal carotid diameter as the denominator. CONTRAST:  50 cc of Isovue 370. COMPARISON:  Prior CT from earlier same day. FINDINGS: CTA NECK FINDINGS Aortic arch: Visualized aortic arch of normal caliber with normal branch pattern. Minimal plaque noted within the arch itself. No high-grade stenosis about the origin of the great vessels. Visualized subclavian arteries widely patent. Right carotid system: Right common carotid artery patent from its origin to the bifurcation. Right ICA widely patent from the bifurcation to the skullbase. No stenosis, dissection, or vascular occlusion within the right carotid artery system. Left carotid system: Left common carotid artery patent from its origin to the bifurcation. No significant narrowing about the left bifurcation. Left ICA widely patent from the bifurcation to the skullbase. No stenosis, dissection, or vascular occlusion within the left carotid artery system. Vertebral arteries: Both of the vertebral arteries arise from the subclavian arteries. Right  vertebral artery dominant. Left vertebral artery diffusely hypoplastic. Vertebral arteries patent without stenosis, dissection, or occlusion. Skeleton: No acute osseous abnormality. No worrisome lytic or blastic osseous lesions. Multilevel degenerative spondylolysis, greatest at C5-6. Other neck: Soft tissues  of the neck demonstrate no acute abnormality. Asymmetric enlargement of the right lobe of thyroid noted. No adenopathy. Upper chest: Visualized upper mediastinum within normal limits. Visualized lungs are clear. Review of the MIP images confirms the above findings CTA HEAD FINDINGS Anterior circulation: Petrous segments widely patent bilaterally. Minimal atheromatous plaque within the cavernous ICAs without flow-limiting stenosis. Supraclinoid segments widely patent. ICA termini patent. Left A1 segment widely patent. Right A1 segment hypoplastic/absent. Anterior communicating artery normal. Anterior cerebral arteries patent to their distal aspects. There is a focal high-grade stenosis within the distal left A2 branch (series 506, image 19). M1 segments widely patent without stenosis or occlusion. No proximal M2 occlusion. Distal MCA branches well opacified and symmetric. Posterior circulation: Dominant right vertebral artery widely patent to the vertebrobasilar junction. Moderate smooth narrowing of the hypoplastic left V4 segment (series 502, image 157). Left vertebral artery largely terminates in PICA, although a tiny branch adjacent towards the vertebrobasilar junction. Basilar artery widely patent. Superior cerebral arteries patent bilaterally. Left PCA supplied via the basilar and is widely patent to its distal aspect. Fetal type right PCA supplied via a widely patent right posterior communicating artery. Right PCA also patent to its distal aspect. Venous sinuses: Patent. Anatomic variants: Fetal type right PCA. No aneurysm or vascular malformation. Delayed phase: No pathologic enhancement. Review of the  MIP images confirms the above findings IMPRESSION: CTA NECK IMPRESSION: Negative CTA of the neck. No significant atheromatous disease for patient age. No high-grade or flow-limiting stenosis. CTA HEAD IMPRESSION: 1. Single short-segment severe left A2 stenosis. 2. Otherwise negative CTA. No other high-grade or correctable stenosis. 3. 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 Dominant right vertebral artery widely patent to the vertebral basilar junction. Fetal type right PCA. 4. Hypoplastic/absent right A1 segment, with the anterior cerebral arteries supplied via the left internal carotid artery system. Electronically Signed   By: Jeannine Boga M.D.   On: 07/20/2016 01:46   Ct Head Wo Contrast  Result Date: 07/19/2016 CLINICAL DATA:  Weakness, facial droop EXAM: CT HEAD WITHOUT CONTRAST TECHNIQUE: Contiguous axial images were obtained from the base of the skull through the vertex without intravenous contrast. COMPARISON:  12/14/2012 FINDINGS: Brain: No intracranial hemorrhage, mass effect or midline shift. Minimal cerebral atrophy. Mild periventricular and patchy subcortical white matter decreased attenuation probable due to chronic small vessel ischemic changes. No definite acute cortical infarction. Vascular: Minimal atherosclerotic calcifications of carotid siphon Skull: No skull fracture. Sinuses/Orbits: No acute findings. Other: None IMPRESSION: No acute intracranial abnormality. Minimal cerebral atrophy. Mild periventricular and patchy subcortical white matter decreased attenuation probable due to chronic small vessel ischemic changes. No definite acute cortical infarction. Electronically Signed   By: Lahoma Crocker M.D.   On: 07/19/2016 15:48   Ct Angio Neck W Or Wo Contrast  Result Date: 07/20/2016 CLINICAL DATA:  Initial evaluation for headache with right-sided weakness and numbness. EXAM: CT ANGIOGRAPHY HEAD AND NECK TECHNIQUE: Multidetector CT imaging of the head and neck  was performed using the standard protocol during bolus administration of intravenous contrast. Multiplanar CT image reconstructions and MIPs were obtained to evaluate the vascular anatomy. Carotid stenosis measurements (when applicable) are obtained utilizing NASCET criteria, using the distal internal carotid diameter as the denominator. CONTRAST:  50 cc of Isovue 370. COMPARISON:  Prior CT from earlier same day. FINDINGS: CTA NECK FINDINGS Aortic arch: Visualized aortic arch of normal caliber with normal branch pattern. Minimal plaque noted within the arch itself. No high-grade stenosis about the origin  of the great vessels. Visualized subclavian arteries widely patent. Right carotid system: Right common carotid artery patent from its origin to the bifurcation. Right ICA widely patent from the bifurcation to the skullbase. No stenosis, dissection, or vascular occlusion within the right carotid artery system. Left carotid system: Left common carotid artery patent from its origin to the bifurcation. No significant narrowing about the left bifurcation. Left ICA widely patent from the bifurcation to the skullbase. No stenosis, dissection, or vascular occlusion within the left carotid artery system. Vertebral arteries: Both of the vertebral arteries arise from the subclavian arteries. Right vertebral artery dominant. Left vertebral artery diffusely hypoplastic. Vertebral arteries patent without stenosis, dissection, or occlusion. Skeleton: No acute osseous abnormality. No worrisome lytic or blastic osseous lesions. Multilevel degenerative spondylolysis, greatest at C5-6. Other neck: Soft tissues of the neck demonstrate no acute abnormality. Asymmetric enlargement of the right lobe of thyroid noted. No adenopathy. Upper chest: Visualized upper mediastinum within normal limits. Visualized lungs are clear. Review of the MIP images confirms the above findings CTA HEAD FINDINGS Anterior circulation: Petrous segments widely  patent bilaterally. Minimal atheromatous plaque within the cavernous ICAs without flow-limiting stenosis. Supraclinoid segments widely patent. ICA termini patent. Left A1 segment widely patent. Right A1 segment hypoplastic/absent. Anterior communicating artery normal. Anterior cerebral arteries patent to their distal aspects. There is a focal high-grade stenosis within the distal left A2 branch (series 506, image 19). M1 segments widely patent without stenosis or occlusion. No proximal M2 occlusion. Distal MCA branches well opacified and symmetric. Posterior circulation: Dominant right vertebral artery widely patent to the vertebrobasilar junction. Moderate smooth narrowing of the hypoplastic left V4 segment (series 502, image 157). Left vertebral artery largely terminates in PICA, although a tiny branch adjacent towards the vertebrobasilar junction. Basilar artery widely patent. Superior cerebral arteries patent bilaterally. Left PCA supplied via the basilar and is widely patent to its distal aspect. Fetal type right PCA supplied via a widely patent right posterior communicating artery. Right PCA also patent to its distal aspect. Venous sinuses: Patent. Anatomic variants: Fetal type right PCA. No aneurysm or vascular malformation. Delayed phase: No pathologic enhancement. Review of the MIP images confirms the above findings IMPRESSION: CTA NECK IMPRESSION: Negative CTA of the neck. No significant atheromatous disease for patient age. No high-grade or flow-limiting stenosis. CTA HEAD IMPRESSION: 1. Single short-segment severe left A2 stenosis. 2. Otherwise negative CTA. No other high-grade or correctable stenosis. 3. 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 Dominant right vertebral artery widely patent to the vertebral basilar junction. Fetal type right PCA. 4. Hypoplastic/absent right A1 segment, with the anterior cerebral arteries supplied via the left internal carotid artery system.  Electronically Signed   By: Jeannine Boga M.D.   On: 07/20/2016 01:46   Mr Brain Wo Contrast  Result Date: 07/20/2016 CLINICAL DATA:  Evaluation for slurred speech, right facial droop. EXAM: MRI HEAD WITHOUT CONTRAST TECHNIQUE: Multiplanar, multiecho pulse sequences of the brain and surrounding structures were obtained without intravenous contrast. COMPARISON:  Prior CTA from 07/19/2016. FINDINGS: Brain: Diffuse prominence of the CSF containing spaces compatible with generalized cerebral atrophy. Patchy and confluent T2/FLAIR hyperintensity within the periventricular and deep white matter both cerebral hemispheres most compatible chronic microvascular disease, mild for age. Remote lacunar infarct present within the left thalamus. There is patchy abnormal restricted diffusion involving the posterior right lentiform nucleus extending towards the periventricular white matter of the right corona radiata (series 4, image 29). No associated hemorrhage or mass effect. No other  evidence for acute or subacute ischemia. No other evidence for acute or chronic intracranial hemorrhage. No other areas of chronic infarction identified. No mass lesion, midline shift or mass effect. Ventricles normal in size without evidence for hydrocephalus. No extra-axial fluid collection. Major dural sinuses are grossly patent. Pituitary gland within normal limits. Vascular: Major intracranial vascular flow voids are maintained. Skull and upper cervical spine: Craniocervical junction within normal limits. Visualized upper cervical spine unremarkable. Bone marrow signal intensity within normal limits. No scalp soft tissue abnormality. Sinuses/Orbits: Globes and orbital soft tissues within normal limits. Patient status post lens extraction bilaterally. Scattered mucosal thickening within the ethmoidal air cells and maxillary sinuses. Paranasal sinuses are otherwise clear. Small bilateral mastoid effusions, right greater than left. Inner  ear structures normal. Other: No other significant finding. IMPRESSION: 1. Patchy small volume acute ischemic nonhemorrhagic right basal ganglia infarct. 2. Small remote left thalamic lacunar infarct. 3. Generalized age-related cerebral atrophy with mild chronic microvascular ischemic disease. Electronically Signed   By: Jeannine Boga M.D.   On: 07/20/2016 03:21       Medical Problem List and Plan: 1.  Left hemiparesis and functional deficits secondary to right basal ganglia infarct  -admit to inpatient rehab 2.  DVT Prophylaxis/Anticoagulation: Pharmaceutical: Lovenox 3. Pain Management: tylenol prn for OA pain.  4. Mood: LCSW to follow for evaluation and support.  5. Neuropsych: This patient is capable of making decisions on her own behalf. 6. Skin/Wound Care: Routine pressure relief measures. 7. Fluids/Electrolytes/Nutrition: Monitor I/O. Check lytes in am. Maintain adequate nutrition and hydration status.  8. Dyslipidemia:Now on zocor 40 mg daily.  9. GB disease/GERD: Managed by diet.       Post Admission Physician Evaluation: 1. Functional deficits secondary  to right basal ganglia infarct. 2. Patient is admitted to receive collaborative, interdisciplinary care between the physiatrist, rehab nursing staff, and therapy team. 3. Patient's level of medical complexity and substantial therapy needs in context of that medical necessity cannot be provided at a lesser intensity of care such as a SNF. 4. Patient has experienced substantial functional loss from his/her baseline which was documented above under the "Functional History" and "Functional Status" headings.  Judging by the patient's diagnosis, physical exam, and functional history, the patient has potential for functional progress which will result in measurable gains while on inpatient rehab.  These gains will be of substantial and practical use upon discharge  in facilitating mobility and self-care at the household  level. 5. Physiatrist will provide 24 hour management of medical needs as well as oversight of the therapy plan/treatment and provide guidance as appropriate regarding the interaction of the two. 6. The Preadmission Screening has been reviewed and patient status is unchanged unless otherwise stated above. 7. 24 hour rehab nursing will assist with bladder management, bowel management, safety, skin/wound care, disease management, medication administration, pain management and patient education  and help integrate therapy concepts, techniques,education, etc. 8. PT will assess and treat for/with: Lower extremity strength, range of motion, stamina, balance, functional mobility, safety, adaptive techniques and equipment, NMR, community reintegration, family/pt education.   Goals are: mod I. 9. OT will assess and treat for/with: ADL's, functional mobility, safety, upper extremity strength, adaptive techniques and equipment, NMR, egosupport, community reintegration, pt/family ed.   Goals are: mod I. Therapy maty proceed with showering this patient. 10. SLP will assess and treat for/with: speech/communication and swallowing.  Goals are: mod I. 11. Case Management and Social Worker will assess and treat for psychological issues and  discharge planning. 12. Team conference will be held weekly to assess progress toward goals and to determine barriers to discharge. 13. Patient will receive at least 3 hours of therapy per day at least 5 days per week. 14. ELOS: 8-13 days       15. Prognosis:  excellent     Meredith Staggers, MD, [facility] 07/21/2016  Bary Leriche, Hershal Coria 07/21/2016

## 2016-07-21 NOTE — Discharge Summary (Addendum)
Physician Discharge Summary  SALONI LABLANC MRN: [id number] DOB/AGE: [birth year] 71 y.o.  PCP: Dillard Essex (Inactive)   Admit date: 07/19/2016 Discharge date: 07/21/2016  Discharge Diagnoses:    Principal Problem:   TIA (transient ischemic attack) Active Problems:   HLD (hyperlipidemia)   CVA (cerebral vascular accident) ([place])    Follow-up recommendations Follow-up with PCP in 3-5 days , including all  additional recommended appointments as below Follow-up CBC, CMP in 3-5 days Pt will follow up with Dr. Erlinda Hong at [facility] in about 6 weeks Follow-up on the results of 2-D echo which is still pending at the time of discharge     Current Discharge Medication List    START taking these medications   Details  clopidogrel (PLAVIX) 75 MG tablet Take 1 tablet (75 mg total) by mouth daily. Qty: 30 tablet, Refills: 1      CONTINUE these medications which have NOT CHANGED   Details  ALOE VERA PO Take 1 tablet by mouth daily as needed (INFLAMMATION).     Calcium Citrate (CITRACAL PO) Take 1 tablet by mouth daily.    CINNAMON PO Take 1 tablet by mouth daily.    Cyanocobalamin (B-12 PO) Take 1 tablet by mouth daily.    Ginkgo Biloba (GINKGO PO) Take 60 mg by mouth daily. Take 60 mg daily    OVER THE COUNTER MEDICATION Take 1 application by mouth daily as needed (NASAL RASH).    PROAIR HFA 108 (90 BASE) MCG/ACT inhaler Inhale 1-2 puffs into the lungs as needed for shortness of breath.     simvastatin (ZOCOR) 20 MG tablet Take 40 mg by mouth at bedtime.    TEA TREE OIL EX Apply 1 application topically daily as needed (FOR NASAL RASH).      STOP taking these medications     aspirin EC 81 MG EC tablet      naproxen sodium (ANAPROX) 220 MG tablet          Discharge Condition: Stable   Discharge Instructions Get Medicines reviewed and adjusted: Please take all your medications with you for your next visit with your Primary MD  Please request your Primary MD to go  over all hospital tests and procedure/radiological results at the follow up, please ask your Primary MD to get all Hospital records sent to his/her office.  If you experience worsening of your admission symptoms, develop shortness of breath, life threatening emergency, suicidal or homicidal thoughts you must seek medical attention immediately by calling 911 or calling your MD immediately if symptoms less severe.  You must read complete instructions/literature along with all the possible adverse reactions/side effects for all the Medicines you take and that have been prescribed to you. Take any new Medicines after you have completely understood and accpet all the possible adverse reactions/side effects.   Do not drive when taking Pain medications.   Do not take more than prescribed Pain, Sleep and Anxiety Medications  Special Instructions: If you have smoked or chewed Tobacco in the last 2 yrs please stop smoking, stop any regular Alcohol and or any Recreational drug use.  Wear Seat belts while driving.  Please note  You were cared for by a hospitalist during your hospital stay. Once you are discharged, your primary care physician will handle any further medical issues. Please note that NO REFILLS for any discharge medications will be authorized once you are discharged, as it is imperative that you return to your primary care physician (or establish a relationship with  a primary care physician if you do not have one) for your aftercare needs so that they can reassess your need for medications and monitor your lab values.  Discharge Instructions    Diet - low sodium heart healthy    Complete by:  As directed    Increase activity slowly    Complete by:  As directed        No Known Allergies    Disposition: 01-Home or Self Care   Consults: Neurology    Significant Diagnostic Studies:  Ct Angio Head W Or Wo Contrast  Result Date: 07/20/2016 CLINICAL DATA:  Initial evaluation  for headache with right-sided weakness and numbness. EXAM: CT ANGIOGRAPHY HEAD AND NECK TECHNIQUE: Multidetector CT imaging of the head and neck was performed using the standard protocol during bolus administration of intravenous contrast. Multiplanar CT image reconstructions and MIPs were obtained to evaluate the vascular anatomy. Carotid stenosis measurements (when applicable) are obtained utilizing NASCET criteria, using the distal internal carotid diameter as the denominator. CONTRAST:  50 cc of Isovue 370. COMPARISON:  Prior CT from earlier same day. FINDINGS: CTA NECK FINDINGS Aortic arch: Visualized aortic arch of normal caliber with normal branch pattern. Minimal plaque noted within the arch itself. No high-grade stenosis about the origin of the great vessels. Visualized subclavian arteries widely patent. Right carotid system: Right common carotid artery patent from its origin to the bifurcation. Right ICA widely patent from the bifurcation to the skullbase. No stenosis, dissection, or vascular occlusion within the right carotid artery system. Left carotid system: Left common carotid artery patent from its origin to the bifurcation. No significant narrowing about the left bifurcation. Left ICA widely patent from the bifurcation to the skullbase. No stenosis, dissection, or vascular occlusion within the left carotid artery system. Vertebral arteries: Both of the vertebral arteries arise from the subclavian arteries. Right vertebral artery dominant. Left vertebral artery diffusely hypoplastic. Vertebral arteries patent without stenosis, dissection, or occlusion. Skeleton: No acute osseous abnormality. No worrisome lytic or blastic osseous lesions. Multilevel degenerative spondylolysis, greatest at C5-6. Other neck: Soft tissues of the neck demonstrate no acute abnormality. Asymmetric enlargement of the right lobe of thyroid noted. No adenopathy. Upper chest: Visualized upper mediastinum within normal limits.  Visualized lungs are clear. Review of the MIP images confirms the above findings CTA HEAD FINDINGS Anterior circulation: Petrous segments widely patent bilaterally. Minimal atheromatous plaque within the cavernous ICAs without flow-limiting stenosis. Supraclinoid segments widely patent. ICA termini patent. Left A1 segment widely patent. Right A1 segment hypoplastic/absent. Anterior communicating artery normal. Anterior cerebral arteries patent to their distal aspects. There is a focal high-grade stenosis within the distal left A2 branch (series 506, image 19). M1 segments widely patent without stenosis or occlusion. No proximal M2 occlusion. Distal MCA branches well opacified and symmetric. Posterior circulation: Dominant right vertebral artery widely patent to the vertebrobasilar junction. Moderate smooth narrowing of the hypoplastic left V4 segment (series 502, image 157). Left vertebral artery largely terminates in PICA, although a tiny branch adjacent towards the vertebrobasilar junction. Basilar artery widely patent. Superior cerebral arteries patent bilaterally. Left PCA supplied via the basilar and is widely patent to its distal aspect. Fetal type right PCA supplied via a widely patent right posterior communicating artery. Right PCA also patent to its distal aspect. Venous sinuses: Patent. Anatomic variants: Fetal type right PCA. No aneurysm or vascular malformation. Delayed phase: No pathologic enhancement. Review of the MIP images confirms the above findings IMPRESSION: CTA NECK IMPRESSION: Negative CTA of  the neck. No significant atheromatous disease for patient age. No high-grade or flow-limiting stenosis. CTA HEAD IMPRESSION: 1. Single short-segment severe left A2 stenosis. 2. Otherwise negative CTA. No other high-grade or correctable stenosis. 3. 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 Dominant right vertebral artery widely patent to the vertebral basilar junction. Fetal type right  PCA. 4. Hypoplastic/absent right A1 segment, with the anterior cerebral arteries supplied via the left internal carotid artery system. Electronically Signed   By: Jeannine Boga M.D.   On: 07/20/2016 01:46   Ct Head Wo Contrast  Result Date: 07/19/2016 CLINICAL DATA:  Weakness, facial droop EXAM: CT HEAD WITHOUT CONTRAST TECHNIQUE: Contiguous axial images were obtained from the base of the skull through the vertex without intravenous contrast. COMPARISON:  12/14/2012 FINDINGS: Brain: No intracranial hemorrhage, mass effect or midline shift. Minimal cerebral atrophy. Mild periventricular and patchy subcortical white matter decreased attenuation probable due to chronic small vessel ischemic changes. No definite acute cortical infarction. Vascular: Minimal atherosclerotic calcifications of carotid siphon Skull: No skull fracture. Sinuses/Orbits: No acute findings. Other: None IMPRESSION: No acute intracranial abnormality. Minimal cerebral atrophy. Mild periventricular and patchy subcortical white matter decreased attenuation probable due to chronic small vessel ischemic changes. No definite acute cortical infarction. Electronically Signed   By: Lahoma Crocker M.D.   On: 07/19/2016 15:48   Ct Angio Neck W Or Wo Contrast  Result Date: 07/20/2016 CLINICAL DATA:  Initial evaluation for headache with right-sided weakness and numbness. EXAM: CT ANGIOGRAPHY HEAD AND NECK TECHNIQUE: Multidetector CT imaging of the head and neck was performed using the standard protocol during bolus administration of intravenous contrast. Multiplanar CT image reconstructions and MIPs were obtained to evaluate the vascular anatomy. Carotid stenosis measurements (when applicable) are obtained utilizing NASCET criteria, using the distal internal carotid diameter as the denominator. CONTRAST:  50 cc of Isovue 370. COMPARISON:  Prior CT from earlier same day. FINDINGS: CTA NECK FINDINGS Aortic arch: Visualized aortic arch of normal caliber  with normal branch pattern. Minimal plaque noted within the arch itself. No high-grade stenosis about the origin of the great vessels. Visualized subclavian arteries widely patent. Right carotid system: Right common carotid artery patent from its origin to the bifurcation. Right ICA widely patent from the bifurcation to the skullbase. No stenosis, dissection, or vascular occlusion within the right carotid artery system. Left carotid system: Left common carotid artery patent from its origin to the bifurcation. No significant narrowing about the left bifurcation. Left ICA widely patent from the bifurcation to the skullbase. No stenosis, dissection, or vascular occlusion within the left carotid artery system. Vertebral arteries: Both of the vertebral arteries arise from the subclavian arteries. Right vertebral artery dominant. Left vertebral artery diffusely hypoplastic. Vertebral arteries patent without stenosis, dissection, or occlusion. Skeleton: No acute osseous abnormality. No worrisome lytic or blastic osseous lesions. Multilevel degenerative spondylolysis, greatest at C5-6. Other neck: Soft tissues of the neck demonstrate no acute abnormality. Asymmetric enlargement of the right lobe of thyroid noted. No adenopathy. Upper chest: Visualized upper mediastinum within normal limits. Visualized lungs are clear. Review of the MIP images confirms the above findings CTA HEAD FINDINGS Anterior circulation: Petrous segments widely patent bilaterally. Minimal atheromatous plaque within the cavernous ICAs without flow-limiting stenosis. Supraclinoid segments widely patent. ICA termini patent. Left A1 segment widely patent. Right A1 segment hypoplastic/absent. Anterior communicating artery normal. Anterior cerebral arteries patent to their distal aspects. There is a focal high-grade stenosis within the distal left A2 branch (series 506, image 19).  M1 segments widely patent without stenosis or occlusion. No proximal M2  occlusion. Distal MCA branches well opacified and symmetric. Posterior circulation: Dominant right vertebral artery widely patent to the vertebrobasilar junction. Moderate smooth narrowing of the hypoplastic left V4 segment (series 502, image 157). Left vertebral artery largely terminates in PICA, although a tiny branch adjacent towards the vertebrobasilar junction. Basilar artery widely patent. Superior cerebral arteries patent bilaterally. Left PCA supplied via the basilar and is widely patent to its distal aspect. Fetal type right PCA supplied via a widely patent right posterior communicating artery. Right PCA also patent to its distal aspect. Venous sinuses: Patent. Anatomic variants: Fetal type right PCA. No aneurysm or vascular malformation. Delayed phase: No pathologic enhancement. Review of the MIP images confirms the above findings IMPRESSION: CTA NECK IMPRESSION: Negative CTA of the neck. No significant atheromatous disease for patient age. No high-grade or flow-limiting stenosis. CTA HEAD IMPRESSION: 1. Single short-segment severe left A2 stenosis. 2. Otherwise negative CTA. No other high-grade or correctable stenosis. 3. 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 Dominant right vertebral artery widely patent to the vertebral basilar junction. Fetal type right PCA. 4. Hypoplastic/absent right A1 segment, with the anterior cerebral arteries supplied via the left internal carotid artery system. Electronically Signed   By: Jeannine Boga M.D.   On: 07/20/2016 01:46   Mr Brain Wo Contrast  Result Date: 07/20/2016 CLINICAL DATA:  Evaluation for slurred speech, right facial droop. EXAM: MRI HEAD WITHOUT CONTRAST TECHNIQUE: Multiplanar, multiecho pulse sequences of the brain and surrounding structures were obtained without intravenous contrast. COMPARISON:  Prior CTA from 07/19/2016. FINDINGS: Brain: Diffuse prominence of the CSF containing spaces compatible with generalized cerebral  atrophy. Patchy and confluent T2/FLAIR hyperintensity within the periventricular and deep white matter both cerebral hemispheres most compatible chronic microvascular disease, mild for age. Remote lacunar infarct present within the left thalamus. There is patchy abnormal restricted diffusion involving the posterior right lentiform nucleus extending towards the periventricular white matter of the right corona radiata (series 4, image 29). No associated hemorrhage or mass effect. No other evidence for acute or subacute ischemia. No other evidence for acute or chronic intracranial hemorrhage. No other areas of chronic infarction identified. No mass lesion, midline shift or mass effect. Ventricles normal in size without evidence for hydrocephalus. No extra-axial fluid collection. Major dural sinuses are grossly patent. Pituitary gland within normal limits. Vascular: Major intracranial vascular flow voids are maintained. Skull and upper cervical spine: Craniocervical junction within normal limits. Visualized upper cervical spine unremarkable. Bone marrow signal intensity within normal limits. No scalp soft tissue abnormality. Sinuses/Orbits: Globes and orbital soft tissues within normal limits. Patient status post lens extraction bilaterally. Scattered mucosal thickening within the ethmoidal air cells and maxillary sinuses. Paranasal sinuses are otherwise clear. Small bilateral mastoid effusions, right greater than left. Inner ear structures normal. Other: No other significant finding. IMPRESSION: 1. Patchy small volume acute ischemic nonhemorrhagic right basal ganglia infarct. 2. Small remote left thalamic lacunar infarct. 3. Generalized age-related cerebral atrophy with mild chronic microvascular ischemic disease. Electronically Signed   By: Jeannine Boga M.D.   On: 07/20/2016 03:21    echocardiogram     Filed Weights   07/19/16 1506 07/19/16 2030  Weight: 77.1 kg (170 lb) 79.9 kg (176 lb 2.4 oz)      Microbiology: No results found for this or any previous visit (from the past 240 hour(s)).     Blood Culture    Component Value Date/Time  SDES URINE, CLEAN CATCH 04/19/2007 1335   [place] 04/19/2007 1335   CULT NO GROWTH 04/19/2007 1335   REPTSTATUS 04/21/2007 FINAL 04/19/2007 1335      Labs: Results for orders placed or performed during the hospital encounter of 07/19/16 (from the past 48 hour(s))  Protime-INR     Status: None   Collection Time: 07/19/16  3:08 PM  Result Value Ref Range   Prothrombin Time 13.3 11.4 - 15.2 seconds   INR 1.00   APTT     Status: None   Collection Time: 07/19/16  3:08 PM  Result Value Ref Range   aPTT 32 24 - 36 seconds  CBC     Status: None   Collection Time: 07/19/16  3:08 PM  Result Value Ref Range   WBC 6.0 4.0 - 10.5 K/uL   RBC 4.72 3.87 - 5.11 MIL/uL   Hemoglobin 14.0 12.0 - 15.0 g/dL   HCT 42.0 36.0 - 46.0 %   MCV 89.0 78.0 - 100.0 fL   MCH 29.7 26.0 - 34.0 pg   MCHC 33.3 30.0 - 36.0 g/dL   RDW 13.6 11.5 - 15.5 %   Platelets 266 150 - 400 K/uL  Differential     Status: None   Collection Time: 07/19/16  3:08 PM  Result Value Ref Range   Neutrophils Relative % 53 %   Neutro Abs 3.2 1.7 - 7.7 K/uL   Lymphocytes Relative 36 %   Lymphs Abs 2.1 0.7 - 4.0 K/uL   Monocytes Relative 9 %   Monocytes Absolute 0.5 0.1 - 1.0 K/uL   Eosinophils Relative 2 %   Eosinophils Absolute 0.1 0.0 - 0.7 K/uL   Basophils Relative 0 %   Basophils Absolute 0.0 0.0 - 0.1 K/uL  Comprehensive metabolic panel     Status: Abnormal   Collection Time: 07/19/16  3:08 PM  Result Value Ref Range   Sodium 140 135 - 145 mmol/L   Potassium 3.8 3.5 - 5.1 mmol/L   Chloride 103 101 - 111 mmol/L   CO2 27 22 - 32 mmol/L   Glucose, Bld 111 (H) 65 - 99 mg/dL   BUN 9 6 - 20 mg/dL   Creatinine, Ser 0.78 0.44 - 1.00 mg/dL   Calcium 9.7 8.9 - 10.3 mg/dL   Total Protein 7.3 6.5 - 8.1 g/dL   Albumin 4.2 3.5 - 5.0 g/dL   AST 22 15 - 41 U/L   ALT 16  14 - 54 U/L   Alkaline Phosphatase 83 38 - 126 U/L   Total Bilirubin 1.4 (H) 0.3 - 1.2 mg/dL   GFR calc non Af Amer >60 >60 mL/min   GFR calc Af Amer >60 >60 mL/min    Comment: (NOTE) The eGFR has been calculated using the CKD EPI equation. This calculation has not been validated in all clinical situations. eGFR's persistently <60 mL/min signify possible Chronic Kidney Disease.    Anion gap 10 5 - 15  CBG monitoring, ED     Status: Abnormal   Collection Time: 07/19/16  3:10 PM  Result Value Ref Range   Glucose-Capillary 129 (H) 65 - 99 mg/dL  I-stat troponin, ED     Status: None   Collection Time: 07/19/16  3:20 PM  Result Value Ref Range   Troponin i, poc 0.00 0.00 - 0.08 ng/mL   Comment 3            Comment: Due to the release kinetics of cTnI, a negative result within  the first hours of the onset of symptoms does not rule out myocardial infarction with certainty. If myocardial infarction is still suspected, repeat the test at appropriate intervals.   I-Stat Chem 8, ED     Status: Abnormal   Collection Time: 07/19/16  3:22 PM  Result Value Ref Range   Sodium 141 135 - 145 mmol/L   Potassium 3.9 3.5 - 5.1 mmol/L   Chloride 101 101 - 111 mmol/L   BUN 11 6 - 20 mg/dL   Creatinine, Ser 0.80 0.44 - 1.00 mg/dL   Glucose, Bld 113 (H) 65 - 99 mg/dL   Calcium, Ion 1.12 (L) 1.15 - 1.40 mmol/L   TCO2 29 0 - 100 mmol/L   Hemoglobin 13.9 12.0 - 15.0 g/dL   HCT 41.0 36.0 - 46.0 %  Hemoglobin A1c     Status: None   Collection Time: 07/20/16  5:14 AM  Result Value Ref Range   Hgb A1c MFr Bld 5.4 4.8 - 5.6 %    Comment: (NOTE)         Pre-diabetes: 5.7 - 6.4         Diabetes: >6.4         Glycemic control for adults with diabetes: <7.0    Mean Plasma Glucose 108 mg/dL    Comment: (NOTE) Performed At: [facility] [place], [place] [id number] Lindon Romp MD [id number]   TSH     Status: None   Collection Time: 07/20/16  5:14 AM  Result Value Ref  Range   TSH 3.190 0.350 - 4.500 uIU/mL    Comment: Performed by a 3rd Generation assay with a functional sensitivity of <=0.01 uIU/mL.  Lipid panel     Status: Abnormal   Collection Time: 07/20/16  5:14 AM  Result Value Ref Range   Cholesterol 193 0 - 200 mg/dL   Triglycerides 219 (H) <150 mg/dL   HDL 46 >40 mg/dL   Total CHOL/HDL Ratio 4.2 RATIO   VLDL 44 (H) 0 - 40 mg/dL   LDL Cholesterol 103 (H) 0 - 99 mg/dL    Comment:        Total Cholesterol/HDL:CHD Risk Coronary Heart Disease Risk Table                     Men   Women  1/2 Average Risk   3.4   3.3  Average Risk       5.0   4.4  2 X Average Risk   9.6   7.1  3 X Average Risk  23.4   11.0        Use the calculated Patient Ratio above and the CHD Risk Table to determine the patient's CHD Risk.        ATP III CLASSIFICATION (LDL):  <100     mg/dL   Optimal  100-129  mg/dL   Near or Above                    Optimal  130-159  mg/dL   Borderline  160-189  mg/dL   High  >190     mg/dL   Very High   Vitamin B12     Status: None   Collection Time: 07/20/16  5:14 AM  Result Value Ref Range   Vitamin B-12 389 180 - 914 pg/mL    Comment: (NOTE) This assay is not validated for testing neonatal or myeloproliferative syndrome specimens for Vitamin B12 levels.  CBC     Status: None   Collection Time: 07/20/16  5:14 AM  Result Value Ref Range   WBC 5.1 4.0 - 10.5 K/uL   RBC 4.44 3.87 - 5.11 MIL/uL   Hemoglobin 12.9 12.0 - 15.0 g/dL   HCT 39.5 36.0 - 46.0 %   MCV 89.0 78.0 - 100.0 fL   MCH 29.1 26.0 - 34.0 pg   MCHC 32.7 30.0 - 36.0 g/dL   RDW 13.6 11.5 - 15.5 %   Platelets 224 150 - 400 K/uL  Comprehensive metabolic panel     Status: Abnormal   Collection Time: 07/20/16  5:14 AM  Result Value Ref Range   Sodium 140 135 - 145 mmol/L   Potassium 4.3 3.5 - 5.1 mmol/L   Chloride 105 101 - 111 mmol/L   CO2 23 22 - 32 mmol/L   Glucose, Bld 89 65 - 99 mg/dL   BUN 10 6 - 20 mg/dL   Creatinine, Ser 0.66 0.44 - 1.00 mg/dL    Calcium 8.9 8.9 - 10.3 mg/dL   Total Protein 6.4 (L) 6.5 - 8.1 g/dL   Albumin 3.8 3.5 - 5.0 g/dL   AST 23 15 - 41 U/L   ALT 16 14 - 54 U/L   Alkaline Phosphatase 77 38 - 126 U/L   Total Bilirubin 1.4 (H) 0.3 - 1.2 mg/dL   GFR calc non Af Amer >60 >60 mL/min   GFR calc Af Amer >60 >60 mL/min    Comment: (NOTE) The eGFR has been calculated using the CKD EPI equation. This calculation has not been validated in all clinical situations. eGFR's persistently <60 mL/min signify possible Chronic Kidney Disease.    Anion gap 12 5 - 15     Lipid Panel     Component Value Date/Time   CHOL 193 07/20/2016 0514   TRIG 219 (H) 07/20/2016 0514   HDL 46 07/20/2016 0514   CHOLHDL 4.2 07/20/2016 0514   VLDL 44 (H) 07/20/2016 0514   LDLCALC 103 (H) 07/20/2016 0514     Lab Results  Component Value Date   HGBA1C 5.4 07/20/2016   HGBA1C 5.7 (H) 12/14/2012      HPI   71 y.o. female with a past medical history of TIA, hyperlipidemia, who denies any history of hypertension, diabetes, who was in her usual state of health until this morning ,presented to the emergency department because of difficulty speaking and walking. History is obtained directly from the patient. Her daughters are present at the bedside and offer additional information as needed.  Patient reports that earlier today, she felt extremely tired. She also had a headache and felt as if she was speaking more slowly than normal. She was able to go to church and singwith the choir without any difficulty. When she was stepping down from the choir, she felt as if she is having difficulty walking. She felt unsteady and thinks that her right leg was weak. At some point, her daughter noticed that she had a left facial droop and possible weakness of the left arm. The patient herself is unaware of these issues. She was brought to the emergency department for further evaluation. The ED physician noted a normal examination. The admitting  hospitalist noted normal strength with dysdiadochokinesis on the left arm. She is now being admitted for further evaluation and neurology consultation is requested for recommendations.  Currently, she is not endorsing any symptoms apart from ongoing sensory changes in the right side of her face and  a mild headache. She has a reported history of prior TIA in 2014 and states that symptoms at the time were similar to today's. She takes aspirin 81 mg daily and simvastatin 20 mgdaily. Patient was not administered IV t-PA.   HOSPITAL COURSE   Acute stroke: Right basal ganglia infarct. - Resultant left facial droop, left hemiparesis and ataxia - CT head 3/25: No acute intracranial abnormality. No definite acute cortical infarction. - MRI brain: Patchy small volume acute ischemic nonhemorrhagic right basal ganglia infarct. Small remote left thalamic lacunar infarct. - CT head and neck 3/25: Single short segment severe left A2 stenosis. Hypoplastic left vertebral artery. Fetal-type right PCA. Hypoplastic/absent right A1 segment with anterior cerebral artery supplied via the left ICA system. - 2-D echo pending - Carotid Dopplers: Not needed.  CTA head and neck.No other high-grade or correctable stenosis. - LDL 103 - A1c 5.4 - Patient was on aspirin 81 MG daily prior to admission. Now on Plavix 75 MG daily for secondary stroke prophylaxis.  therapies evaluation: CIR.  SLP. Regular diet and liquids     Elevated blood pressure, possible chronic hypertension - Patient denies prior history of HTN. Allow permissive hypertension in the context of acute stroke and normalize after 5-7 days.   Hyperlipidemia - LDL 103, goal <70. Increased simvastatin to 40 mg a day  Discharge Exam:  Blood pressure (!) 127/59, pulse 71, temperature 98.7 F (37.1 C), temperature source Oral, resp. rate 18, height 5' 3" (1.6 m), weight 79.9 kg (176 lb 2.4 oz), SpO2 98 %. Cardio: regular rate and rhythm, S1, S2 normal,  no murmur, click, rub or gallop GI: soft, non-tender; bowel sounds normal; no masses,  no organomegaly Extremities: extremities normal, atraumatic, no cyanosis or edema Pulses: 2+ and symmetric Skin: Skin color, texture, turgor normal. No rashes or lesions Lymph nodes: Cervical, supraclavicular, and axillary nodes normal. Neurologic: Awake and alert. Oriented 3. Cranial nerves II-12 seem intact , however, cannot entirely rule out subtle left-sided facial droop. No pronator drift. Finger-to-nose was off on the left side     Follow-up Information    PICKETT,KATHERINE, PA-C. Call.   Why:  To make follow-up appointment, hospital follow-up Contact information: [place] [address] [phone]        Xu,Jindong, MD. Call.   Specialty:  Neurology Why:  To make follow-up appointment, see neurology in 4-6 weeks Contact information: [address] [place] [address] [phone]           Signed: Reyne Dumas 07/21/2016, 12:13 PM        Time spent >45 mins

## 2016-07-21 NOTE — Progress Notes (Signed)
Rehab admissions - I met with patient and her daughter, Roselyn Reef, at the bedside.  They would like inpatient rehab admission prior to home.  Attending has cleared patient for admit to inpatient rehab today.  Bed available and will admit to acute inpatient rehab today.  Call me for questions.  #[phone]

## 2016-07-21 NOTE — Progress Notes (Signed)
Patient received at 1630 via wheelchair from [facility]. Patient alert and oriented x 4 no complaint of pain. Patient and family verbalized understanding of admission process. Continue with plan of care.  Mliss Sax

## 2016-07-21 NOTE — PMR Pre-admission (Signed)
PMR Admission Coordinator Pre-Admission Assessment  Patient: Catherine [name] is an 71 y.o., female MRN: [id number] DOB: 1946-01-17 Height: 5\' 3"  (160 cm) Weight: 79.9 kg (176 lb 2.4 oz)              Insurance Information HMO: No   PPO:       PCP:       IPA:       80/20:       OTHER:   PRIMARY:  Medicare A/B      Policy#:  [id number]      Subscriber:  Trinna Balloon CM Name:        Phone#:       Fax#:   Pre-Cert#:        Employer:   Benefits:  Phone #:       Name: Checked in [place]. Date: A=01/25/09 and B=01/25/14     Deduct: $1340      Out of Pocket Max: None      Life Max:  Unlimited CIR: 100%      SNF: 100 days Outpatient: 80%     Co-Pay: 20% Home Health: 100%      Co-Pay: none DME: 80%     Co-Pay: 20% Providers: patient's choice  SECONDARY:  BCBS supplement      Policy#: [id number]      Subscriber:  Trinna Balloon CM Name:        Phone#:       Fax#:   Pre-Cert#:        Employer:   Benefits:  Phone #: [phone]     Name:   Eff. Date:       Deduct:        Out of Pocket Max:        Life Max:   CIR:        SNF:   Outpatient:       Co-Pay:   Home Health:        Co-Pay:   DME:       Co-Pay:    Note:  Secondary insurance:  Changes to [place] [employer] [place] on 07/26/16 per patient  Emergency Contact Information Contact Information    Name Relation Home Work Mobile   Victor Daughter [phone]     Tidwell,Michelle Daughter [phone]  [phone]     Current Medical History  Patient Admitting Diagnosis:  R BG infarct  History of Present Illness: A 71 y.o. right handed female with history of TIA 2014 maintained on aspirin, hyperlipidemia. Presented 07/19/2016 with unsteady gait, left-sided weakness and speech difficulty. Per chart review patient lives with daughter and 4 year old grandson. Independent  prior to admission and very active. One level home. CT/MRI showed patchy small volume acute ischemic nonhemorrhagic right basal ganglia infarct. Small remote left  thalamic lacunar infarct. CT angiogram of head and neck showed single short segment severe left A2 stenosis otherwise negative CTA. Patient did not receive TPA. EEG negative for seizure. Echocardiogram pending. Neurology consulted currently on Plavix for CVA prophylaxis. Subcutaneous Lovenox for DVT prophylaxis. Tolerating a regular diet. Physical therapy evaluation completed with recommendations of physical medicine rehabilitation consult.    Total: 4=NIH  Past Medical History  Past Medical History:  Diagnosis Date  . Arthritis   . Cataract   . GERD (gastroesophageal reflux disease)   . TIA (transient ischemic attack)     Family History  family history includes Cancer in her father and mother; Dementia in her mother; Stroke in her mother.  Prior Rehab/Hospitalizations:  No previous rehab admissions.  Has the patient had major surgery during 100 days prior to admission? No  Current Medications   Current Facility-Administered Medications:  .  acetaminophen (TYLENOL) tablet 650 mg, 650 mg, Oral, Q4H PRN, 650 mg at 07/21/16 1058 **OR** acetaminophen (TYLENOL) solution 650 mg, 650 mg, Per Tube, Q4H PRN **OR** acetaminophen (TYLENOL) suppository 650 mg, 650 mg, Rectal, Q4H PRN, Bonnielee Haff, MD .  clopidogrel (PLAVIX) tablet 75 mg, 75 mg, Oral, Daily, Rosalin Hawking, MD, 75 mg at 07/21/16 1058 .  enoxaparin (LOVENOX) injection 40 mg, 40 mg, Subcutaneous, Q24H, Bonnielee Haff, MD, 40 mg at 07/20/16 2225 .  senna-docusate (Senokot-S) tablet 1 tablet, 1 tablet, Oral, QHS PRN, Bonnielee Haff, MD .  simvastatin (ZOCOR) tablet 40 mg, 40 mg, Oral, QHS, Rosalin Hawking, MD  Patients Current Diet: Diet Heart Room service appropriate? Yes; Fluid consistency: Thin Diet - low sodium heart healthy  Precautions / Restrictions Precautions Precautions: Fall Restrictions Weight Bearing Restrictions: No   Has the patient had 2 or more falls or a fall with injury in the past year?Yes.  Fell on the ice 01/18  and fell in the chicken [place] with soreness after falls.  Prior Activity Level Community (5-7x/wk): Went out 3-4 X a week.  Travels 2 hrs to see her 50 yo mom.  Home Assistive Devices / Equipment Home Assistive Devices/Equipment: None Home Equipment: Cane - single point, Environmental consultant - 2 wheels, Shower seat, Bedside commode  Prior Device Use: Indicate devices/aids used by the patient prior to current illness, exacerbation or injury? 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  Independent  Indoor Mobility: Did the patient need assistance with walking from room to room (with or without device)? Independent  Stairs: Did the patient need assistance with internal or external stairs (with or without device)? Independent  Functional Cognition: Did the patient need help planning regular tasks such as shopping or remembering to take medications? Independent  Current Functional Level Cognition  Arousal/Alertness: Awake/alert Overall Cognitive Status: No family/caregiver present to determine baseline cognitive functioning Orientation Level: Oriented X4 General Comments: Required increased time for processing but was able to complete higher level cognitive tasks when increased time was provided. Plan to assess financial management tasks next session. Attention: Sustained Sustained Attention: Impaired Sustained Attention Impairment: Verbal complex Memory: Impaired Memory Impairment: Retrieval deficit Awareness: Appears intact Problem Solving: Appears intact Safety/Judgment: Appears intact    Extremity Assessment (includes Sensation/Coordination)  Upper Extremity Assessment: LUE deficits/detail LUE  Deficits / Details: Shoulder flexion 4-/5, elbow flexion and extension 4/5, grasp 4/5. Decreased light touch sensation. Signs of dysmetria with finger to nose, decreased fine motor coordination, and slowed movement with rapid pronation/supination. LUE Sensation: decreased light touch LUE Coordination: decreased fine motor, decreased gross motor  Lower Extremity Assessment: Defer to PT evaluation LLE Deficits / Details: AROM WFL, strength hip flexion 4-/5, knee extension 4/5, ankle DF 4-/5, slowed coord with toe tapping    ADLs  Overall ADL's : Needs assistance/impaired Eating/Feeding: Sitting, Minimal assistance Eating/Feeding Details (indicate cue type and reason): Educated on checking for pocketing frequently due to L sided facial droop and decreased oral motor strength and sensation. Requires assistance to cut food and manipulate utensils. Grooming: Minimal  assistance, Standing Upper Body Bathing: Sitting, Min guard Lower Body Bathing: Min guard, Sit to/from stand Upper Body Dressing : Minimal assistance, Sitting Lower Body Dressing: Sit to/from stand, Minimal assistance Toilet Transfer: Minimal assistance, Ambulation, RW, BSC Toilet Transfer Details (indicate cue type and reason): Simulated in room from bed to chair Toileting- Clothing Manipulation and Hygiene: Minimal assistance, Sit to/from stand Functional mobility during ADLs: Minimal assistance, Rolling walker General ADL Comments: Pt with difficulty donning glasses due to decreased L UE fine motor coordination.    Mobility  Overal bed mobility: Needs Assistance Bed Mobility: Supine to Sit Supine to sit: Min guard General bed mobility comments: up in chair    Transfers  Overall transfer level: Needs assistance Equipment used: Rolling walker (2 wheeled) Transfers: Sit to/from Stand Sit to Stand: Min guard General transfer comment: steadying assist    Ambulation / Gait / Stairs / Wheelchair Mobility   Ambulation/Gait Ambulation/Gait assistance: Museum/gallery curator (Feet): 110 Feet Assistive device: Rolling walker (2 wheeled) Gait Pattern/deviations: Step-through pattern, Decreased stride length, Steppage, Drifts right/left, Wide base of support General Gait Details: over weight shifts to L and demonstrates instability esp with turns Gait velocity: slow pace    Posture / Balance Dynamic Sitting Balance Sitting balance - Comments: Sitting EOB no back support Balance Overall balance assessment: Needs assistance Sitting-balance support: No upper extremity supported, Feet supported Sitting balance-Leahy Scale: Good Sitting balance - Comments: Sitting EOB no back support Standing balance support: No upper extremity supported, Bilateral upper extremity supported, During functional activity Standing balance-Leahy Scale: Fair Standing balance comment: Able to statically stand at sink without UE support briefly during ADL tasks.    Special needs/care consideration BiPAP/CPAP No CPM No Continuous Drip IV No Dialysis No   Life Vest No Oxygen No Special Bed No Trach Size No Wound Vac (area) No     Skin No                              Bowel mgmt: Last BM 07/21/16 per patient Bladder mgmt: Voiding in bathroom with assist Diabetic mgmt No    Previous Home Environment Living Arrangements: Children  Lives With: Other (Comment) (daughter lives with her mother) Available Help at Discharge: Family, Available PRN/intermittently Type of Home: House Home Layout: One level Home Access: Stairs to enter Technical brewer of Steps: 1 small step Bathroom Shower/Tub: Multimedia programmer: Handicapped height [place]: No  Discharge Living Setting Plans for Discharge Living Setting: Patient's home, House, Lives with (comment) (Lives with a daughter and a grandson.) Type of Home at Discharge: House Discharge Home Layout: One level Discharge Home Access: Stairs  to enter Entrance Stairs-Number of Steps: 1+1 small steps Does the patient have any problems obtaining your medications?: No  Social/Family/Support Systems Patient Roles: Parent (Has 3 daughters.) Contact Information: Ferne Reus - daughter Anticipated Caregiver: self and daughters Anticipated Caregiver's Contact Information: Roselyn Reef - daughter - [phone] Ability/Limitations of Caregiver: Daughter works, has 2 other daughters and a grandson. Caregiver Availability: Intermittent Discharge Plan Discussed with Primary Caregiver: Yes Is Caregiver In Agreement with Plan?: Yes Does Caregiver/Family have Issues with Lodging/Transportation while Pt is in Rehab?: No  Goals/Additional Needs Patient/Family Goal for Rehab: PT/OT/SLP Mod I goals Expected length of stay: 8-13 days Cultural Considerations: [employer] Dietary Needs: Heart diet, thin liquids Equipment Needs: TBD Pt/Family Agrees to Admission and willing to participate: Yes (Spoke with patient and her daughter at bedside.)  Program Orientation Provided & Reviewed with Pt/Caregiver Including Roles  & Responsibilities: Yes  Decrease burden of Care through IP rehab admission: N/A  Possible need for SNF placement upon discharge: Not anticipated  Patient Condition: This patient's condition remains as documented in the consult dated 07/20/16, in which the Rehabilitation Physician determined and documented that the patient's condition is appropriate for intensive rehabilitative care in an inpatient rehabilitation facility. Will admit to inpatient rehab today.  Preadmission Screen Completed By:  Retta Diones, 07/21/2016 3:15 PM ______________________________________________________________________   Discussed status with Dr. Naaman Plummer on 07/21/16 at 1510 and received telephone approval for admission today.  Admission Coordinator:  Retta Diones, time 1514/Date 07/21/16

## 2016-07-21 NOTE — Progress Notes (Signed)
  Echocardiogram 2D Echocardiogram has been performed.  Darlina Sicilian M 07/21/2016, 12:12 PM

## 2016-07-21 NOTE — H&P (Signed)
Physical Medicine and Rehabilitation Admission H&P       Chief Complaint  Patient presents with  . Left sided weakness with difficulty walking  . Left facial droop with slurred speech    HPI:  Catherine Collier Hortonis a 71 y.o.RH femalewith history of TIA 2014, OA , GERD who was admitted on 07/19/2016 with unsteady gait, left-sided weakness and speech difficulty. MRI brain done revealing small right basal ganglia infarct with remote left thalamic lacunar infract. EEG without evidence of seizure.  2 D echo done today. CTA head/neck revealed short segment severe left A2 segment. Dr. Erlinda Hong recommends changing ASA to plavix for stroke secondary to small vessel disease and and increasing statin dose.   Review of Systems  HENT: Negative for hearing loss and tinnitus.   Eyes: Negative for blurred vision and double vision.  Respiratory: Negative for cough and shortness of breath.   Cardiovascular: Negative for chest pain and palpitations.  Gastrointestinal: Positive for heartburn. Negative for constipation and diarrhea.  Genitourinary: Negative for dysuria and urgency.  Musculoskeletal: Positive for back pain (with heavy work) and myalgias.  Skin: Negative for itching and rash.  Neurological: Positive for sensory change, speech change, focal weakness, weakness and headaches (for past 2 months). Negative for dizziness.  Psychiatric/Behavioral: Negative for depression. The patient has insomnia (at times). The patient is not nervous/anxious.           Past Medical History:  Diagnosis Date  . Arthritis   . Cataract   . GERD (gastroesophageal reflux disease)   . TIA (transient ischemic attack)         Past Surgical History:  Procedure Laterality Date  . EYE SURGERY          Family History  Problem Relation Age of Onset  . Dementia Mother   . Cancer Mother   . Stroke Mother   . Cancer Father     Social History: Widowed. Retired. Daughter and grandson live with  her.   reports that she has never smoked. She has never used smokeless tobacco. She reports that she does not drink alcohol or use drugs.    Allergies: No Known Allergies          Medications Prior to Admission  Medication Sig Dispense Refill  . ALOE VERA PO Take 1 tablet by mouth daily as needed (INFLAMMATION).     Marland Kitchen aspirin EC 81 MG EC tablet Take 1 tablet (81 mg total) by mouth daily. 30 tablet 11  . Calcium Citrate (CITRACAL PO) Take 1 tablet by mouth daily.    Marland Kitchen CINNAMON PO Take 1 tablet by mouth daily.    . Cyanocobalamin (B-12 PO) Take 1 tablet by mouth daily.    . Ginkgo Biloba (GINKGO PO) Take 60 mg by mouth daily. Take 60 mg daily    . naproxen sodium (ANAPROX) 220 MG tablet Take 220 mg by mouth 2 (two) times daily as needed (PAIN).    Marland Kitchen OVER THE COUNTER MEDICATION Take 1 application by mouth daily as needed (NASAL RASH).    Marland Kitchen PROAIR HFA 108 (90 BASE) MCG/ACT inhaler Inhale 1-2 puffs into the lungs as needed for shortness of breath.     . simvastatin (ZOCOR) 20 MG tablet Take 20 mg by mouth at bedtime.    . TEA TREE OIL EX Apply 1 application topically daily as needed (FOR NASAL RASH).      Home: Home Living Family/patient expects to be discharged to:: Private residence Living Arrangements: Children  Available Help at Discharge: Family, Available PRN/intermittently Type of Home: House Home Access: Stairs to enter [employer] of Steps: 1 small step Home Layout: One level Bathroom Shower/Tub: Multimedia programmer: Handicapped height Home Equipment: [employer] - single point, Environmental consultant - 2 wheels, Shower seat, Bedside commode  Lives With: Other (Comment) (daughter lives with her mother)   Functional History: Prior Function Level of Independence: Independent Comments: 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, etc; slipped on ice recently, but no injury; family report they can provide initial 24 hour assist ay  d/c  Functional Status:  Mobility: Bed Mobility Overal bed mobility: Needs Assistance Bed Mobility: Supine to Sit Supine to sit: Min guard General bed mobility comments: up in chair Transfers Overall transfer level: Needs assistance Equipment used: Rolling walker (2 wheeled) Transfers: Sit to/from Stand Sit to Stand: Min guard General transfer comment: steadying assist Ambulation/Gait Ambulation/Gait assistance: Min assist Ambulation Distance (Feet): 110 Feet Assistive device: Rolling walker (2 wheeled) Gait Pattern/deviations: Step-through pattern, Decreased stride length, Steppage, Drifts right/left, Wide base of support General Gait Details: over weight shifts to L and demonstrates instability esp with turns Gait velocity: slow pace  ADL: ADL Overall ADL's : Needs assistance/impaired Eating/Feeding: Sitting, Minimal assistance Eating/Feeding Details (indicate cue type and reason): Educated on checking for pocketing frequently due to L sided facial droop and decreased oral motor strength and sensation. Requires assistance to cut food and manipulate utensils. Grooming: Minimal assistance, Standing Upper Body Bathing: Sitting, Min guard Lower Body Bathing: Min guard, Sit to/from stand Upper Body Dressing : Minimal assistance, Sitting Lower Body Dressing: Sit to/from stand, Minimal assistance Toilet Transfer: Minimal assistance, Ambulation, RW, BSC Toilet Transfer Details (indicate cue type and reason): Simulated in room from bed to chair Toileting- Clothing Manipulation and Hygiene: Minimal assistance, Sit to/from stand Functional mobility during ADLs: Minimal assistance, Rolling walker General ADL Comments: Pt with difficulty donning glasses due to decreased L UE fine motor coordination.  Cognition: Cognition Overall Cognitive Status: No family/caregiver present to determine baseline cognitive functioning Arousal/Alertness: Awake/alert Orientation Level: Oriented  X4 Attention: Sustained Sustained Attention: Impaired Sustained Attention Impairment: Verbal complex Memory: Impaired Memory Impairment: Retrieval deficit Awareness: Appears intact Problem Solving: Appears intact Safety/Judgment: Appears intact Cognition Arousal/Alertness: Awake/alert Behavior During Therapy: WFL for tasks assessed/performed Overall Cognitive Status: No family/caregiver present to determine baseline cognitive functioning Area of Impairment: Problem solving Problem Solving: Slow processing General Comments: Required increased time for processing but was able to complete higher level cognitive tasks when increased time was provided. Plan to assess financial management tasks next session.  Physical Exam: Blood pressure (!) 127/59, pulse 71, temperature 98.7 F (37.1 C), temperature source Oral, resp. rate 18, height 5' 3" (1.6 m), weight 79.9 kg (176 lb 2.4 oz), SpO2 98 %. Physical Exam  Nursing note and vitals reviewed. Constitutional: She is oriented to person, place, and time. She appears well-developed and well-nourished.  HENT:  Head: Normocephalic and atraumatic.  Mouth/Throat: Oropharynx is clear and moist.  Eyes: Conjunctivae are normal. Pupils are equal, round, and reactive to light.  Neck: Normal range of motion. Neck supple.  Cardiovascular: Normal rate and regular rhythm.   Respiratory: Effort normal and breath sounds normal. No respiratory distress. She has no wheezes.  GI: Soft. Bowel sounds are normal. She exhibits no distension. There is no tenderness.  Musculoskeletal: She exhibits no edema or tenderness.  Neurological: She is alert and oriented to person, place, and time.  Left facial weakness with soft  voice. Able to follow one and two step commands without difficulty. Sensory deficits LUE>LLE--improving.  Motor: LUE/LLE:  3-/5 deltoid, 3-/5 bicep, 3-/5 tricep, 3/5 wrist extension, 3-/5 hand intrinsics.      3/5 hip flexor, 3/5 knee extension, 4/5  ankle dorsiflexion, 4/5 ankle plantarflexion. Sensation 1+/2 LUE and LLE.     Skin: Skin is warm and dry. No rash noted.  Psychiatric: She has a normal mood and affect. Her behavior is normal. Judgment and thought content normal.    Lab Results Last 48 Hours        Results for orders placed or performed during the hospital encounter of 07/19/16 (from the past 48 hour(s))  Protime-INR     Status: None   Collection Time: 07/19/16  3:08 PM  Result Value Ref Range   Prothrombin Time 13.3 11.4 - 15.2 seconds   INR 1.00   APTT     Status: None   Collection Time: 07/19/16  3:08 PM  Result Value Ref Range   aPTT 32 24 - 36 seconds  CBC     Status: None   Collection Time: 07/19/16  3:08 PM  Result Value Ref Range   WBC 6.0 4.0 - 10.5 K/uL   RBC 4.72 3.87 - 5.11 MIL/uL   Hemoglobin 14.0 12.0 - 15.0 g/dL   HCT 42.0 36.0 - 46.0 %   MCV 89.0 78.0 - 100.0 fL   MCH 29.7 26.0 - 34.0 pg   MCHC 33.3 30.0 - 36.0 g/dL   RDW 13.6 11.5 - 15.5 %   Platelets 266 150 - 400 K/uL  Differential     Status: None   Collection Time: 07/19/16  3:08 PM  Result Value Ref Range   Neutrophils Relative % 53 %   Neutro Abs 3.2 1.7 - 7.7 K/uL   Lymphocytes Relative 36 %   Lymphs Abs 2.1 0.7 - 4.0 K/uL   Monocytes Relative 9 %   Monocytes Absolute 0.5 0.1 - 1.0 K/uL   Eosinophils Relative 2 %   Eosinophils Absolute 0.1 0.0 - 0.7 K/uL   Basophils Relative 0 %   Basophils Absolute 0.0 0.0 - 0.1 K/uL  Comprehensive metabolic panel     Status: Abnormal   Collection Time: 07/19/16  3:08 PM  Result Value Ref Range   Sodium 140 135 - 145 mmol/L   Potassium 3.8 3.5 - 5.1 mmol/L   Chloride 103 101 - 111 mmol/L   CO2 27 22 - 32 mmol/L   Glucose, Bld 111 (H) 65 - 99 mg/dL   BUN 9 6 - 20 mg/dL   Creatinine, Ser 0.78 0.44 - 1.00 mg/dL   Calcium 9.7 8.9 - 10.3 mg/dL   Total Protein 7.3 6.5 - 8.1 g/dL   Albumin 4.2 3.5 - 5.0 g/dL   AST 22 15 - 41 U/L   ALT 16 14 - 54 U/L     Alkaline Phosphatase 83 38 - 126 U/L   Total Bilirubin 1.4 (H) 0.3 - 1.2 mg/dL   GFR calc non Af Amer >60 >60 mL/min   GFR calc Af Amer >60 >60 mL/min    Comment: (NOTE) The eGFR has been calculated using the CKD EPI equation. This calculation has not been validated in all clinical situations. eGFR's persistently <60 mL/min signify possible Chronic Kidney Disease.    Anion gap 10 5 - 15  CBG monitoring, ED     Status: Abnormal   Collection Time: 07/19/16  3:10 PM  Result Value Ref Range  Glucose-Capillary 129 (H) 65 - 99 mg/dL  I-stat troponin, ED     Status: None   Collection Time: 07/19/16  3:20 PM  Result Value Ref Range   Troponin i, poc 0.00 0.00 - 0.08 ng/mL   Comment 3            Comment: Due to the release kinetics of cTnI, a negative result within the first hours of the onset of symptoms does not rule out myocardial infarction with certainty. If myocardial infarction is still suspected, repeat the test at appropriate intervals.   I-Stat Chem 8, ED     Status: Abnormal   Collection Time: 07/19/16  3:22 PM  Result Value Ref Range   Sodium 141 135 - 145 mmol/L   Potassium 3.9 3.5 - 5.1 mmol/L   Chloride 101 101 - 111 mmol/L   BUN 11 6 - 20 mg/dL   Creatinine, Ser 0.80 0.44 - 1.00 mg/dL   Glucose, Bld 113 (H) 65 - 99 mg/dL   Calcium, Ion 1.12 (L) 1.15 - 1.40 mmol/L   TCO2 29 0 - 100 mmol/L   Hemoglobin 13.9 12.0 - 15.0 g/dL   HCT 41.0 36.0 - 46.0 %  Hemoglobin A1c     Status: None   Collection Time: 07/20/16  5:14 AM  Result Value Ref Range   Hgb A1c MFr Bld 5.4 4.8 - 5.6 %    Comment: (NOTE)         Pre-diabetes: 5.7 - 6.4         Diabetes: >6.4         Glycemic control for adults with diabetes: <7.0    Mean Plasma Glucose 108 mg/dL    Comment: (NOTE) Performed At: [facility] [place], [place] [id number] Lindon Romp MD QP:[id number]   TSH     Status: None   Collection Time: 07/20/16   5:14 AM  Result Value Ref Range   TSH 3.190 0.350 - 4.500 uIU/mL    Comment: Performed by a 3rd Generation assay with a functional sensitivity of <=0.01 uIU/mL.  Lipid panel     Status: Abnormal   Collection Time: 07/20/16  5:14 AM  Result Value Ref Range   Cholesterol 193 0 - 200 mg/dL   Triglycerides 219 (H) <150 mg/dL   HDL 46 >40 mg/dL   Total CHOL/HDL Ratio 4.2 RATIO   VLDL 44 (H) 0 - 40 mg/dL   LDL Cholesterol 103 (H) 0 - 99 mg/dL    Comment:        Total Cholesterol/HDL:CHD Risk Coronary Heart Disease Risk Table                     Men   Women  1/2 Average Risk   3.4   3.3  Average Risk       5.0   4.4  2 X Average Risk   9.6   7.1  3 X Average Risk  23.4   11.0        Use the calculated Patient Ratio above and the CHD Risk Table to determine the patient's CHD Risk.        ATP III CLASSIFICATION (LDL):  <100     mg/dL   Optimal  100-129  mg/dL   Near or Above                    Optimal  130-159  mg/dL   Borderline  160-189  mg/dL  High  >190     mg/dL   Very High   Vitamin B12     Status: None   Collection Time: 07/20/16  5:14 AM  Result Value Ref Range   Vitamin B-12 389 180 - 914 pg/mL    Comment: (NOTE) This assay is not validated for testing neonatal or myeloproliferative syndrome specimens for Vitamin B12 levels.   CBC     Status: None   Collection Time: 07/20/16  5:14 AM  Result Value Ref Range   WBC 5.1 4.0 - 10.5 K/uL   RBC 4.44 3.87 - 5.11 MIL/uL   Hemoglobin 12.9 12.0 - 15.0 g/dL   HCT 39.5 36.0 - 46.0 %   MCV 89.0 78.0 - 100.0 fL   MCH 29.1 26.0 - 34.0 pg   MCHC 32.7 30.0 - 36.0 g/dL   RDW 13.6 11.5 - 15.5 %   Platelets 224 150 - 400 K/uL  Comprehensive metabolic panel     Status: Abnormal   Collection Time: 07/20/16  5:14 AM  Result Value Ref Range   Sodium 140 135 - 145 mmol/L   Potassium 4.3 3.5 - 5.1 mmol/L   Chloride 105 101 - 111 mmol/L   CO2 23 22 - 32 mmol/L   Glucose, Bld 89 65 - 99 mg/dL    BUN 10 6 - 20 mg/dL   Creatinine, Ser 0.66 0.44 - 1.00 mg/dL   Calcium 8.9 8.9 - 10.3 mg/dL   Total Protein 6.4 (L) 6.5 - 8.1 g/dL   Albumin 3.8 3.5 - 5.0 g/dL   AST 23 15 - 41 U/L   ALT 16 14 - 54 U/L   Alkaline Phosphatase 77 38 - 126 U/L   Total Bilirubin 1.4 (H) 0.3 - 1.2 mg/dL   GFR calc non Af Amer >60 >60 mL/min   GFR calc Af Amer >60 >60 mL/min    Comment: (NOTE) The eGFR has been calculated using the CKD EPI equation. This calculation has not been validated in all clinical situations. eGFR's persistently <60 mL/min signify possible Chronic Kidney Disease.    Anion gap 12 5 - 15      Imaging Results (Last 48 hours)  Ct Angio Head W Or [name] Contrast  Result Date: 07/20/2016 CLINICAL DATA:  Initial evaluation for headache with right-sided weakness and numbness. EXAM: CT ANGIOGRAPHY HEAD AND NECK TECHNIQUE: Multidetector CT imaging of the head and neck was performed using the standard protocol during bolus administration of intravenous contrast. Multiplanar CT image reconstructions and MIPs were obtained to evaluate the vascular anatomy. Carotid stenosis measurements (when applicable) are obtained utilizing NASCET criteria, using the distal internal carotid diameter as the denominator. CONTRAST:  50 cc of Isovue 370. COMPARISON:  Prior CT from earlier same day. FINDINGS: CTA NECK FINDINGS Aortic arch: Visualized aortic arch of normal caliber with normal branch pattern. Minimal plaque noted within the arch itself. No high-grade stenosis about the origin of the great vessels. Visualized subclavian arteries widely patent. Right carotid system: Right common carotid artery patent from its origin to the bifurcation. Right ICA widely patent from the bifurcation to the skullbase. No stenosis, dissection, or vascular occlusion within the right carotid artery system. Left carotid system: Left common carotid artery patent from its origin to the bifurcation. No significant narrowing  about the left bifurcation. Left ICA widely patent from the bifurcation to the skullbase. No stenosis, dissection, or vascular occlusion within the left carotid artery system. Vertebral arteries: Both of the vertebral arteries arise from the  subclavian arteries. Right vertebral artery dominant. Left vertebral artery diffusely hypoplastic. Vertebral arteries patent without stenosis, dissection, or occlusion. Skeleton: No acute osseous abnormality. No worrisome lytic or blastic osseous lesions. Multilevel degenerative spondylolysis, greatest at C5-6. Other neck: Soft tissues of the neck demonstrate no acute abnormality. Asymmetric enlargement of the right lobe of thyroid noted. No adenopathy. Upper chest: Visualized upper mediastinum within normal limits. Visualized lungs are clear. Review of the MIP images confirms the above findings CTA HEAD FINDINGS Anterior circulation: Petrous segments widely patent bilaterally. Minimal atheromatous plaque within the cavernous ICAs without flow-limiting stenosis. Supraclinoid segments widely patent. ICA termini patent. Left A1 segment widely patent. Right A1 segment hypoplastic/absent. Anterior communicating artery normal. Anterior cerebral arteries patent to their distal aspects. There is a focal high-grade stenosis within the distal left A2 branch (series 506, image 19). M1 segments widely patent without stenosis or occlusion. No proximal M2 occlusion. Distal MCA branches well opacified and symmetric. Posterior circulation: Dominant right vertebral artery widely patent to the vertebrobasilar junction. Moderate smooth narrowing of the hypoplastic left V4 segment (series 502, image 157). Left vertebral artery largely terminates in PICA, although a tiny branch adjacent towards the vertebrobasilar junction. Basilar artery widely patent. Superior cerebral arteries patent bilaterally. Left PCA supplied via the basilar and is widely patent to its distal aspect. Fetal type right PCA  supplied via a widely patent right posterior communicating artery. Right PCA also patent to its distal aspect. Venous sinuses: Patent. Anatomic variants: Fetal type right PCA. No aneurysm or vascular malformation. Delayed phase: No pathologic enhancement. Review of the MIP images confirms the above findings IMPRESSION: CTA NECK IMPRESSION: Negative CTA of the neck. No significant atheromatous disease for patient age. No high-grade or flow-limiting stenosis. CTA HEAD IMPRESSION: 1. Single short-segment severe left A2 stenosis. 2. Otherwise negative CTA. No other high-grade or correctable stenosis. 3. 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 Dominant right vertebral artery widely patent to the vertebral basilar junction. Fetal type right PCA. 4. Hypoplastic/absent right A1 segment, with the anterior cerebral arteries supplied via the left internal carotid artery system. Electronically Signed   By: Jeannine Boga M.D.   On: 07/20/2016 01:46   Ct Head [name] Contrast  Result Date: 07/19/2016 CLINICAL DATA:  Weakness, facial droop EXAM: CT HEAD WITHOUT CONTRAST TECHNIQUE: Contiguous axial images were obtained from the base of the skull through the vertex without intravenous contrast. COMPARISON:  12/14/2012 FINDINGS: Brain: No intracranial hemorrhage, mass effect or midline shift. Minimal cerebral atrophy. Mild periventricular and patchy subcortical white matter decreased attenuation probable due to chronic small vessel ischemic changes. No definite acute cortical infarction. Vascular: Minimal atherosclerotic calcifications of carotid siphon Skull: No skull fracture. Sinuses/Orbits: No acute findings. Other: None IMPRESSION: No acute intracranial abnormality. Minimal cerebral atrophy. Mild periventricular and patchy subcortical white matter decreased attenuation probable due to chronic small vessel ischemic changes. No definite acute cortical infarction. Electronically Signed   By: Lahoma Crocker  M.D.   On: 07/19/2016 15:48   Ct Angio Neck W Or [name] Contrast  Result Date: 07/20/2016 CLINICAL DATA:  Initial evaluation for headache with right-sided weakness and numbness. EXAM: CT ANGIOGRAPHY HEAD AND NECK TECHNIQUE: Multidetector CT imaging of the head and neck was performed using the standard protocol during bolus administration of intravenous contrast. Multiplanar CT image reconstructions and MIPs were obtained to evaluate the vascular anatomy. Carotid stenosis measurements (when applicable) are obtained utilizing NASCET criteria, using the distal internal carotid diameter as the denominator. CONTRAST:  50  cc of Isovue 370. COMPARISON:  Prior CT from earlier same day. FINDINGS: CTA NECK FINDINGS Aortic arch: Visualized aortic arch of normal caliber with normal branch pattern. Minimal plaque noted within the arch itself. No high-grade stenosis about the origin of the great vessels. Visualized subclavian arteries widely patent. Right carotid system: Right common carotid artery patent from its origin to the bifurcation. Right ICA widely patent from the bifurcation to the skullbase. No stenosis, dissection, or vascular occlusion within the right carotid artery system. Left carotid system: Left common carotid artery patent from its origin to the bifurcation. No significant narrowing about the left bifurcation. Left ICA widely patent from the bifurcation to the skullbase. No stenosis, dissection, or vascular occlusion within the left carotid artery system. Vertebral arteries: Both of the vertebral arteries arise from the subclavian arteries. Right vertebral artery dominant. Left vertebral artery diffusely hypoplastic. Vertebral arteries patent without stenosis, dissection, or occlusion. Skeleton: No acute osseous abnormality. No worrisome lytic or blastic osseous lesions. Multilevel degenerative spondylolysis, greatest at C5-6. Other neck: Soft tissues of the neck demonstrate no acute abnormality. Asymmetric  enlargement of the right lobe of thyroid noted. No adenopathy. Upper chest: Visualized upper mediastinum within normal limits. Visualized lungs are clear. Review of the MIP images confirms the above findings CTA HEAD FINDINGS Anterior circulation: Petrous segments widely patent bilaterally. Minimal atheromatous plaque within the cavernous ICAs without flow-limiting stenosis. Supraclinoid segments widely patent. ICA termini patent. Left A1 segment widely patent. Right A1 segment hypoplastic/absent. Anterior communicating artery normal. Anterior cerebral arteries patent to their distal aspects. There is a focal high-grade stenosis within the distal left A2 branch (series 506, image 19). M1 segments widely patent without stenosis or occlusion. No proximal M2 occlusion. Distal MCA branches well opacified and symmetric. Posterior circulation: Dominant right vertebral artery widely patent to the vertebrobasilar junction. Moderate smooth narrowing of the hypoplastic left V4 segment (series 502, image 157). Left vertebral artery largely terminates in PICA, although a tiny branch adjacent towards the vertebrobasilar junction. Basilar artery widely patent. Superior cerebral arteries patent bilaterally. Left PCA supplied via the basilar and is widely patent to its distal aspect. Fetal type right PCA supplied via a widely patent right posterior communicating artery. Right PCA also patent to its distal aspect. Venous sinuses: Patent. Anatomic variants: Fetal type right PCA. No aneurysm or vascular malformation. Delayed phase: No pathologic enhancement. Review of the MIP images confirms the above findings IMPRESSION: CTA NECK IMPRESSION: Negative CTA of the neck. No significant atheromatous disease for patient age. No high-grade or flow-limiting stenosis. CTA HEAD IMPRESSION: 1. Single short-segment severe left A2 stenosis. 2. Otherwise negative CTA. No other high-grade or correctable stenosis. 3. Hypoplastic left vertebral  artery largely terminates in PICA. Dominant right vertebral artery widely patent to the vertebral basilar junction. Fetal type right PCA. 4. Hypoplastic/absent right A1 segment, with the anterior cerebral arteries supplied via the left internal carotid artery system. Electronically Signed   By: Jeannine Boga M.D.   On: 07/20/2016 01:46   Catherine [name] Contrast  Result Date: 07/20/2016 CLINICAL DATA:  Evaluation for slurred speech, right facial droop. EXAM: MRI HEAD WITHOUT CONTRAST TECHNIQUE: Multiplanar, multiecho pulse sequences of the brain and surrounding structures were obtained without intravenous contrast. COMPARISON:  Prior CTA from 07/19/2016. FINDINGS: Brain: Diffuse prominence of the CSF containing spaces compatible with generalized cerebral atrophy. Patchy and confluent T2/FLAIR hyperintensity within the periventricular and deep white matter both cerebral hemispheres most compatible chronic microvascular disease, mild for age. Remote lacunar  infarct present within the left thalamus. There is patchy abnormal restricted diffusion involving the posterior right lentiform nucleus extending towards the periventricular white matter of the right corona radiata (series 4, image 29). No associated hemorrhage or mass effect. No other evidence for acute or subacute ischemia. No other evidence for acute or chronic intracranial hemorrhage. No other areas of chronic infarction identified. No mass lesion, midline shift or mass effect. Ventricles normal in size without evidence for hydrocephalus. No extra-axial fluid collection. Major dural sinuses are grossly patent. Pituitary gland within normal limits. Vascular: Major intracranial vascular flow voids are maintained. Skull and upper cervical spine: Craniocervical junction within normal limits. Visualized upper cervical spine unremarkable. Bone marrow signal intensity within normal limits. No scalp soft tissue abnormality. Sinuses/Orbits: Globes and orbital  soft tissues within normal limits. Patient status post lens extraction bilaterally. Scattered mucosal thickening within the ethmoidal air cells and maxillary sinuses. Paranasal sinuses are otherwise clear. Small bilateral mastoid effusions, right greater than left. Inner ear structures normal. Other: No other significant finding. IMPRESSION: 1. Patchy small volume acute ischemic nonhemorrhagic right basal ganglia infarct. 2. Small remote left thalamic lacunar infarct. 3. Generalized age-related cerebral atrophy with mild chronic microvascular ischemic disease. Electronically Signed   By: Jeannine Boga M.D.   On: 07/20/2016 03:21        Medical Problem List and Plan: 1.  Left hemiparesis and functional deficits secondary to right basal ganglia infarct             -admit to inpatient rehab 2.  DVT Prophylaxis/Anticoagulation: Pharmaceutical: Lovenox 3. Pain Management: tylenol prn for OA pain.  4. Mood: LCSW to follow for evaluation and support.  5. Neuropsych: This patient is capable of making decisions on her own behalf. 6. Skin/Wound Care: Routine pressure relief measures. 7. Fluids/Electrolytes/Nutrition: Monitor I/O. Check lytes in am. Maintain adequate nutrition and hydration status.  8. Dyslipidemia:Now on zocor 40 mg daily.  9. GB disease/GERD: Managed by diet.       Post Admission Physician Evaluation: 1. Functional deficits secondary  to right basal ganglia infarct. 2. Patient is admitted to receive collaborative, interdisciplinary care between the physiatrist, rehab nursing staff, and therapy team. 3. Patient's level of medical complexity and substantial therapy needs in context of that medical necessity cannot be provided at a lesser intensity of care such as a SNF. 4. Patient has experienced substantial functional loss from his/her baseline which was documented above under the "Functional History" and "Functional Status" headings.  Judging by the patient's  diagnosis, physical exam, and functional history, the patient has potential for functional progress which will result in measurable gains while on inpatient rehab.  These gains will be of substantial and practical use upon discharge  in facilitating mobility and self-care at the household level. 5. Physiatrist will provide 24 hour management of medical needs as well as oversight of the therapy plan/treatment and provide guidance as appropriate regarding the interaction of the two. 6. The Preadmission Screening has been reviewed and patient status is unchanged unless otherwise stated above. 7. 24 hour rehab nursing will assist with bladder management, bowel management, safety, skin/wound care, disease management, medication administration, pain management and patient education  and help integrate therapy concepts, techniques,education, etc. 8. PT will assess and treat for/with: Lower extremity strength, range of motion, stamina, balance, functional mobility, safety, adaptive techniques and equipment, NMR, community reintegration, family/pt education.   Goals are: mod I. 9. OT will assess and treat for/with: ADL's, functional mobility, safety, upper extremity  strength, adaptive techniques and equipment, NMR, egosupport, community reintegration, pt/family ed.   Goals are: mod I. Therapy maty proceed with showering this patient. 10. SLP will assess and treat for/with: speech/communication and swallowing.  Goals are: mod I. 11. Case Management and Social Worker will assess and treat for psychological issues and discharge planning. 12. Team conference will be held weekly to assess progress toward goals and to determine barriers to discharge. 13. Patient will receive at least 3 hours of therapy per day at least 5 days per week. 14. ELOS: 8-13 days       15. Prognosis:  excellent     Meredith Staggers, MD, [facility] 07/21/2016  Bary Leriche,  Hershal Coria 07/21/2016

## 2016-07-21 NOTE — Progress Notes (Signed)
STROKE TEAM PROGRESS NOTE   SUBJECTIVE (INTERVAL HISTORY) Pt daughter is at bedside. Pt feels left hand strength improved some. No acute event overnight. PT/OT recommend CIR.     OBJECTIVE Temp:  [97.7 F (36.5 C)-98.7 F (37.1 C)] 98.7 F (37.1 C) (03/27 1013) Pulse Rate:  [65-76] 71 (03/27 1013) Cardiac Rhythm: Normal sinus rhythm (03/27 1006) Resp:  [16-18] 18 (03/27 1013) BP: (125-151)/(58-76) 127/59 (03/27 1013) SpO2:  [96 %-99 %] 98 % (03/27 1013)  CBC:   Recent Labs Lab 07/19/16 1508 07/19/16 1522 07/20/16 0514  WBC 6.0  --  5.1  NEUTROABS 3.2  --   --   HGB 14.0 13.9 12.9  HCT 42.0 41.0 39.5  MCV 89.0  --  89.0  PLT 266  --  233    Basic Metabolic Panel:   Recent Labs Lab 07/19/16 1508 07/19/16 1522 07/20/16 0514  NA 140 141 140  K 3.8 3.9 4.3  CL 103 101 105  CO2 27  --  23  GLUCOSE 111* 113* 89  BUN 9 11 10   CREATININE 0.78 0.80 0.66  CALCIUM 9.7  --  8.9    Lipid Panel:     Component Value Date/Time   CHOL 193 07/20/2016 0514   TRIG 219 (H) 07/20/2016 0514   HDL 46 07/20/2016 0514   CHOLHDL 4.2 07/20/2016 0514   VLDL 44 (H) 07/20/2016 0514   LDLCALC 103 (H) 07/20/2016 0514   HgbA1c:  Lab Results  Component Value Date   HGBA1C 5.4 07/20/2016   Urine Drug Screen:     Component Value Date/Time   LABOPIA NONE DETECTED 12/14/2012 1642   COCAINSCRNUR NONE DETECTED 12/14/2012 1642   LABBENZ NONE DETECTED 12/14/2012 1642   AMPHETMU NONE DETECTED 12/14/2012 1642   THCU NONE DETECTED 12/14/2012 1642   LABBARB NONE DETECTED 12/14/2012 1642      IMAGING I have personally reviewed the radiological images below and agree with the radiology interpretations.  Ct Angio Head and neck W Or Wo Contrast 07/20/2016 IMPRESSION: CTA NECK IMPRESSION: Negative CTA of the neck. No significant atheromatous disease for patient age. No high-grade or flow-limiting stenosis. CTA HEAD IMPRESSION: 1. Single short-segment severe left A2 stenosis. 2. Otherwise  negative CTA. No other high-grade or correctable stenosis. 3. 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 Dominant right vertebral artery widely patent to the vertebral basilar junction. Fetal type right PCA. 4. Hypoplastic/absent right A1 segment, with the anterior cerebral arteries supplied via the left internal carotid artery system.   Ct Head Wo Contrast 07/19/2016 IMPRESSION: No acute intracranial abnormality. Minimal cerebral atrophy. Mild periventricular and patchy subcortical white matter decreased attenuation probable due to chronic small vessel ischemic changes. No definite acute cortical infarction.   Mr Brain Wo Contrast 07/20/2016 IMPRESSION: 1. Patchy small volume acute ischemic nonhemorrhagic right basal ganglia infarct. 2. Small remote left thalamic lacunar infarct. 3. Generalized age-related cerebral atrophy with mild chronic microvascular ischemic disease.   EEG - This awake and asleep EEG is normal.   A normal EEG does not exclude a clinical diagnosis of epilepsy. Clinical correlation is advised.  TTE pending  PHYSICAL EXAM  Temp:  [97.7 F (36.5 C)-98.7 F (37.1 C)] 98.7 F (37.1 C) (03/27 1013) Pulse Rate:  [65-76] 71 (03/27 1013) Resp:  [16-18] 18 (03/27 1013) BP: (125-151)/(58-76) 127/59 (03/27 1013) SpO2:  [96 %-99 %] 98 % (03/27 1013)  General - Well nourished, well developed, in no apparent distress.  Ophthalmologic - Fundi not visualized due  to small pupils.  Cardiovascular - Regular rate and rhythm.  Mental Status -  Level of arousal and orientation to time, place, and person were intact. Language including expression, naming, repetition, comprehension was assessed and found intact. Fund of Knowledge was assessed and was intact.  Cranial Nerves II - XII - II - Visual field intact OU. III, IV, VI - Extraocular movements intact. V - Facial sensation intact bilaterally. VII - mild left facial droop. VIII - Hearing & vestibular intact  bilaterally. X - Palate elevates symmetrically. XI - Chin turning & shoulder shrug intact bilaterally. XII - Tongue protrusion intact.  Motor Strength - The patient's strength was normal in all extremities except left hand dexterity difficulty and left foot 4+/5 DF and pronator drift was present.  Bulk was normal and fasciculations were absent.   Motor Tone - Muscle tone was assessed at the neck and appendages and was normal.  Reflexes - The patient's reflexes were 1+ in all extremities and she had no pathological reflexes.  Sensory - Light touch, temperature/pinprick were assessed and were symmetrical.    Coordination - The patient had normal movements in the hands and feet with no ataxia or dysmetria.  Tremor was absent.  Gait and Station - deferred   ASSESSMENT/PLAN Ms. SHAASIA ODLE is a 71 y.o. female with history of prior TIA, GERD, HLD and arthritis presenting with headache, R face numbness, RLE weakness, L face/arm weakness, speech changes. She did not receive IV t-PA.   Stroke:  right BG/CR infarct secondary to small vessel disease source  Resultant  Left facial droop, left hand and foot mild weakness  MRI  Right BG/CR lacunar infarct  CTA head and neck - left A2 stenosis (chronic)  2D Echo  Pending  EEG normal  LDL 103  HgbA1c 5.4  Lovenox 40 mg sq daily for VTE prophylaxis Diet Heart Room service appropriate? Yes; Fluid consistency: Thin Diet - low sodium heart healthy  aspirin 81 mg daily prior to admission, now on clopidogrel 75 mg daily. Continue plavix on discharge.  Patient counseled to be compliant with her antithrombotic medications  Ongoing aggressive stroke risk factor management  Therapy recommendations:  CIR  Disposition:  CIR today  Hx stroke/TIA  11/2012 - headache, slurry speech and dizziness. MRI negative, MRA left ACA stenosis - LDL 130 - on ASA and lipitor  Later pt is on ASA and zocor  Elevated BP  No hx hypertension, not on home  medications  BP 150s/70s  Monitor   Hyperlipidemia  Home meds:  zocor 20 and omega 3, resumed in hospital  LDL 103, goal < 70  Consider increasing statin dose to 40mg   Continue statin at discharge  Other Stroke Risk Factors  Advanced age  Obesity, Body mass index is 31.2 kg/m., recommend weight loss, diet and exercise as appropriate   Family hx stroke (mother)  Hospital day # 2  Neurology will sign off. Please call with questions. Pt will follow up with Dr. Erlinda Hong at [facility] in about 6 weeks. Thanks for the consult.  Rosalin Hawking, MD PhD Stroke Neurology 07/21/2016 12:23 PM   To contact Stroke Continuity provider, please refer to [email or web address] After hours, contact General Neurology

## 2016-07-22 ENCOUNTER — Inpatient Hospital Stay (HOSPITAL_COMMUNITY): Payer: Medicare Other | Admitting: Physical Therapy

## 2016-07-22 ENCOUNTER — Inpatient Hospital Stay (HOSPITAL_COMMUNITY): Payer: Medicare Other | Admitting: Occupational Therapy

## 2016-07-22 ENCOUNTER — Inpatient Hospital Stay (HOSPITAL_COMMUNITY): Payer: Medicare Other | Admitting: Speech Pathology

## 2016-07-22 LAB — CBC WITH DIFFERENTIAL/PLATELET
Basophils Absolute: 0 10*3/uL (ref 0.0–0.1)
Basophils Relative: 0 %
EOS ABS: 0.1 10*3/uL (ref 0.0–0.7)
EOS PCT: 2 %
HCT: 40.7 % (ref 36.0–46.0)
Hemoglobin: 13.4 g/dL (ref 12.0–15.0)
LYMPHS ABS: 1.7 10*3/uL (ref 0.7–4.0)
LYMPHS PCT: 32 %
MCH: 29.3 pg (ref 26.0–34.0)
MCHC: 32.9 g/dL (ref 30.0–36.0)
MCV: 89.1 fL (ref 78.0–100.0)
MONO ABS: 0.5 10*3/uL (ref 0.1–1.0)
Monocytes Relative: 10 %
Neutro Abs: 3 10*3/uL (ref 1.7–7.7)
Neutrophils Relative %: 56 %
PLATELETS: 235 10*3/uL (ref 150–400)
RBC: 4.57 MIL/uL (ref 3.87–5.11)
RDW: 13.8 % (ref 11.5–15.5)
WBC: 5.4 10*3/uL (ref 4.0–10.5)

## 2016-07-22 LAB — COMPREHENSIVE METABOLIC PANEL
ALBUMIN: 3.7 g/dL (ref 3.5–5.0)
ALT: 18 U/L (ref 14–54)
AST: 21 U/L (ref 15–41)
Alkaline Phosphatase: 73 U/L (ref 38–126)
Anion gap: 8 (ref 5–15)
BILIRUBIN TOTAL: 1 mg/dL (ref 0.3–1.2)
BUN: 14 mg/dL (ref 6–20)
CHLORIDE: 103 mmol/L (ref 101–111)
CO2: 29 mmol/L (ref 22–32)
CREATININE: 0.7 mg/dL (ref 0.44–1.00)
Calcium: 9.3 mg/dL (ref 8.9–10.3)
GFR calc Af Amer: >60 mL/min (ref >60)
GLUCOSE: 102 mg/dL — AB (ref 65–99)
POTASSIUM: 3.6 mmol/L (ref 3.5–5.1)
Sodium: 140 mmol/L (ref 135–145)
TOTAL PROTEIN: 6.4 g/dL — AB (ref 6.5–8.1)

## 2016-07-22 MED ORDER — PANTOPRAZOLE SODIUM 40 MG PO TBEC
40.0000 mg | DELAYED_RELEASE_TABLET | Freq: Every day | ORAL | Status: DC
  Administered 2016-07-22 – 2016-07-29 (×8): 40 mg via ORAL
  Filled 2016-07-22 (×8): qty 1

## 2016-07-22 NOTE — Evaluation (Signed)
Speech Language Pathology Assessment and Plan  Patient Details  Name: Catherine [name] MRN: [id number] Date of Birth: 1945/06/19  SLP Diagnosis: Dysarthria  Rehab Potential: Excellent ELOS: 7 to 10 days    Today's Date: 07/22/2016 SLP Individual Time: 1500-1600 SLP Individual Time Calculation (min): 60 min   Problem List:  Patient Active Problem List   Diagnosis Date Noted  . Basal ganglia infarction ([place]) 07/21/2016  . Stroke (cerebrum) ([place]) 07/19/2016  . HLD (hyperlipidemia) 08/14/2014  . Obesity (BMI 30-39.9) 08/17/2013  . Hyperlipidemia LDL goal < 100 02/15/2013  . TIA (transient ischemic attack) 12/14/2012  . Essential hypertension 12/14/2012  . Other and unspecified hyperlipidemia 12/14/2012   Past Medical History:  Past Medical History:  Diagnosis Date  . Arthritis   . Cataract   . GERD (gastroesophageal reflux disease)   . TIA (transient ischemic attack)    Past Surgical History:  Past Surgical History:  Procedure Laterality Date  . EYE SURGERY      Assessment / Plan / Recommendation Clinical Impression Catherine Huestis Hortonis a 71 y.o.RHfemalewith history of TIA 2014, OA , GERD who was admitted on 07/19/2016 with unsteady gait, left-sided weakness and speech difficulty. MRI brain done revealing small right basal ganglia infarct with remote left thalamic lacunar infract. EEG without evidence of seizure. 2 D echo done today. CTA head/neck revealed short segment severe left A2 segment. Dr. Erlinda Hong recommends changing ASA to plavix for stroke secondary to small vessel disease and and increasing statin dose. Pt admited to CIR on 07/21/16.   Bedside swallow and cognitive linguistic evaluations completed on 07/22/16. Pt with functional cognitive abilities and no deficits identifable at this time. Pt with no overt s/s of aspiration with trials of regular diet textures and thin liquids. Pt with report of constant globus sensation with and without PO intake. Pt also reports that her  esophagus and voice box feels irritated. Of note, pt with esophagram in 07/2013 which revealed 4 x 6 cm hiatal hernia with a Schatzki ring, mild esophageal reflux and diagnosis of cervial dysphagia. Reflux precautions reviewed with pt and PA adding PPI. Recommend pt continue on regular diet with thin liquids, medications whole in puree. Pt also presents with mild flaccid dysarthria d/t left facial weakness and decreased vocal intensity. She is ~ 75% intelligible at theconversation level. Pt frequently sings in public and requires skilled ST to address her dysarthria and increase speech intelligibility. Anticipate that pt may need Outpatient ST to address vocal quality.    Skilled Therapeutic Interventions          Skilled treatment session focused on completion of cognitive linguistic and bedside swallow evaluations. Pt able to complete complex calendar and scheduling tasks, recall events and therapists' names from previous sessions and demonstrate selective attention appropriately. Pt presents with mild flaccid impacting intelligibility at the conversation level.    SLP Assessment  Patient will need skilled Speech Lanaguage Pathology Services during CIR admission    Recommendations  Recommended Consults: Consider esophageal assessment SLP Diet Recommendations: Age appropriate regular solids;Thin Liquid Administration via: Cup;Straw Medication Administration: Whole meds with liquid Supervision: Patient able to self feed Compensations: Slow rate;Small sips/bites Postural Changes and/or Swallow Maneuvers: Seated upright 90 degrees;Upright 30-60 min after meal Oral Care Recommendations: Oral care BID Patient destination: Home Follow up Recommendations: None Equipment Recommended: None recommended by SLP    SLP Frequency 3 to 5 out of 7 days   SLP Duration  SLP Intensity  SLP Treatment/Interventions 7 to 10 days  Minumum of 1-2 x/day, 30 to 90 minutes  Speech/Language facilitation;Therapeutic  Activities;Therapeutic Exercise;Patient/family education    Pain Pain Assessment Pain Assessment: No/denies pain  Prior Functioning Cognitive/Linguistic Baseline: Within functional limits Type of Home: House  Lives With: Family (Daughter and grandson) Available Help at Discharge: Family;Friend(s);Available 24 hours/day Education: college 1 year Vocation: Retired  Function:  Eating Eating   Modified Consistency Diet: No Eating Assist Level: Set up assist for   Eating Set Up Assist For: Opening containers       Cognition Comprehension Comprehension assist level: Understands complex 90% of the time/cues 10% of the time  Expression   Expression assist level: Expresses complex 90% of the time/cues < 10% of the time  Social Interaction Social Interaction assist level: Interacts appropriately with others - No medications needed.  Problem Solving Problem solving assist level: Solves complex problems: Recognizes & self-corrects  Memory Memory assist level: Complete Independence: No helper   Short Term Goals: Week 1: SLP Short Term Goal 1 (Week 1): Pt will utilize speech intelligibility strategies with Min A to increase speech intelligilbity to >90% at the connected sentence level during unstructured task.  SLP Short Term Goal 2 (Week 1): Pt will utilize increased vocal intensity with Min A to to achieve > 90% intelligibility in moderately noisey environment.  SLP Short Term Goal 3 (Week 1): Pt will effectively describe unknown information in barrier tasks with >90% intelligibility and Min A verbal cues.   Refer to Care Plan for Long Term Goals  Recommendations for other services: None   Discharge Criteria: Patient will be discharged from SLP if patient refuses treatment 3 consecutive times without medical reason, if treatment goals not met, if there is a change in medical status, if patient makes no progress towards goals or if patient is discharged from hospital.  The above  assessment, treatment plan, treatment alternatives and goals were discussed and mutually agreed upon: by patient   Catherine [name] Catherine [name], M.S., CCC-SLP Speech-Language Pathologist   Catherine [name] 07/22/2016, 4:43 PM

## 2016-07-22 NOTE — Evaluation (Signed)
Occupational Therapy Assessment and Plan  Patient Details  Name: Catherine [name] MRN: [id number] Date of Birth: Jun 26, 1945  OT Diagnosis: abnormal posture, hemiplegia affecting non-dominant side, muscle weakness (generalized) and coordination disorder, decreased sensation Rehab Potential: Rehab Potential (ACUTE ONLY): Good ELOS: 10days   Today's Date: 07/22/2016 OT Individual Time: 1400-1500 OT Individual Time Calculation (min): 60 min     Problem List:  Patient Active Problem List   Diagnosis Date Noted  . Basal ganglia infarction ([place]) 07/21/2016  . Stroke (cerebrum) ([place]) 07/19/2016  . HLD (hyperlipidemia) 08/14/2014  . Obesity (BMI 30-39.9) 08/17/2013  . Hyperlipidemia LDL goal < 100 02/15/2013  . TIA (transient ischemic attack) 12/14/2012  . Essential hypertension 12/14/2012  . Other and unspecified hyperlipidemia 12/14/2012    Past Medical History:  Past Medical History:  Diagnosis Date  . Arthritis   . Cataract   . GERD (gastroesophageal reflux disease)   . TIA (transient ischemic attack)    Past Surgical History:  Past Surgical History:  Procedure Laterality Date  . EYE SURGERY      Assessment & Plan Clinical Impression: Patient is a 71 y.o. year old female with history of TIA 2014, OA , GERD who was admitted on 07/19/2016 with unsteady gait, left-sided weakness and speech difficulty. MRI brain done revealing small right basal ganglia infarct with remote left thalamic lacunar infract. EEG without evidence of seizure. 2 D echo done today. CTA head/neck revealed short segment severe left A2 segment. Dr. Erlinda Hong recommends changing ASA to plavix for stroke secondary to small vessel disease and and increasing statin dose.    Patient transferred to CIR on 07/21/2016 .    Patient currently requires min with basic self-care skills and IADL secondary to muscle weakness, decreased cardiorespiratoy endurance, decreased coordination and decreased sitting balance, decreased standing  balance, hemiplegia and decreased balance strategies.  Prior to hospitalization, patient could complete ADLs and IADLs with independent .  Patient will benefit from skilled intervention to increase independence with basic self-care skills and increase level of independence with iADL prior to discharge home with care partner.  Anticipate patient will require intermittent supervision and follow up outpatient.  OT - End of Session Activity Tolerance: Decreased this session Endurance Deficit: Yes Endurance Deficit Description: rest breaks taken throughout session.  OT Assessment Rehab Potential (ACUTE ONLY): Good OT Patient demonstrates impairments in the following area(s): Balance;Cognition;Endurance;Pain;Safety;Motor OT Basic ADL's Functional Problem(s): Eating;Grooming;Bathing;Dressing;Toileting OT Advanced ADL's Functional Problem(s): Simple Meal Preparation;Laundry OT Transfers Functional Problem(s): Toilet;Tub/Shower OT Additional Impairment(s): Fuctional Use of Upper Extremity OT Plan OT Intensity: Minimum of 1-2 x/day, 45 to 90 minutes OT Frequency: 5 out of 7 days OT Duration/Estimated Length of Stay: 10days OT Treatment/Interventions: Balance/vestibular training;Discharge planning;Community reintegration;DME/adaptive equipment instruction;Functional mobility training;Psychosocial support;Therapeutic Activities;UE/LE Coordination activities;Patient/family education;UE/LE Strength taining/ROM;Neuromuscular re-education;Self Care/advanced ADL retraining;Therapeutic Exercise OT Self Feeding Anticipated Outcome(s): mod I  OT Basic Self-Care Anticipated Outcome(s): mod I  OT Toileting Anticipated Outcome(s): mod I  OT Bathroom Transfers Anticipated Outcome(s): mod I - toilet, supervision - shower OT Recommendation Recommendations for Other Services: Therapeutic Recreation consult Therapeutic Recreation Interventions: Pet therapy;Kitchen group;Stress management;Outing/community  reintergration Patient destination: Home Follow Up Recommendations: Outpatient OT Equipment Recommended: To be determined   Skilled Therapeutic Intervention Upon entering the room, pt supine in bed with no c/o pain this session. Pt declined bathing and dressing this session but agreeable to OT intervention. Pt performed supine >sit with min verbal cues and steady assist to EOB. Pt engaged in circle sitting to don socks  with assistance to start L sock. Pt ambulates 10' with RW and min A for toileting. Steady assistance for balance for LB clothing management and hygiene this session. Pt performs grooming tasks at sink side while standing with steady assistance and utilized L UE for functional tasks with increased time and multiple attempts made secondary to decreased coordination. Pt seated in recliner chair at end of session. OT educated pt on OT purpose, POC, and goals with pt verbalizing understanding and agreement. Call bell and all needed items within reach upon exiting the room.   OT Evaluation Precautions/Restrictions  Precautions Precautions: Fall Restrictions Weight Bearing Restrictions: No  Pain Pain Assessment Pain Assessment: No/denies pain Home Living/Prior Functioning Home Living Living Arrangements: Children Available Help at Discharge: Family, Friend(s), Available 24 hours/day Type of Home: House Home Access: Stairs to enter [employer] of Steps: 2 Entrance Stairs-Rails: None Home Layout: One level Bathroom Shower/Tub: Walk-in shower Additional Comments: daughter and grandson live with pt. Daughter works during the day but pt does have other family and friends who can stay with her if needed.   Lives With: Family (Daughter and grandson) IADL History Education: college 1 year Prior Function Level of Independence: Independent with homemaking with ambulation  Able to Take Stairs?: Yes Driving: Yes Vocation: Retired Leisure: Hobbies-yes (Comment) Comments:  Reports lifting 50 lb bags of feed PTA. Takes care of her animals during the day. Likes to stay active.  Vision/Perception  Vision- History Baseline Vision/History: Wears glasses Wears Glasses: Reading only Patient Visual Report: No change from baseline Vision- Assessment Vision Assessment?: Yes Eye Alignment: Within Functional Limits Ocular Range of Motion: Within Functional Limits Alignment/Gaze Preference: Within Defined Limits Visual Fields: No apparent deficits  Cognition Overall Cognitive Status: Within Functional Limits for tasks assessed Arousal/Alertness: Awake/alert Orientation Level: Person;Place;Situation Person: Oriented Place: Oriented Situation: Oriented Year: 2018 Month: March Day of Week: Correct Memory: Appears intact Immediate Memory Recall: Sock;Blue;Bed Memory Recall: Sock;Blue;Bed Memory Recall Sock: Without Cue Memory Recall Blue: Without Cue Memory Recall Bed: Without Cue Attention: Selective Sustained Attention: Appears intact Selective Attention: Appears intact Awareness: Appears intact Problem Solving: Appears intact Safety/Judgment: Appears intact Sensation Sensation Light Touch: Impaired by gross assessment Stereognosis: Not tested Hot/Cold: Not tested Additional Comments: L UE and L LE Coordination Gross Motor Movements are Fluid and Coordinated: No Fine Motor Movements are Fluid and Coordinated: No Coordination and Movement Description: Decreased coordination noted through [place]. increased time for alternating movements and opposition Heel Shin Test: decreaed accuracy with Rt LE Motor  Motor Motor: Hemiplegia Motor - Skilled Clinical Observations: Decreased strength through Rt LE.  Mobility  Transfers Transfers: Sit to Stand;Stand to Sit Sit to Stand: 4: Min assist Stand to Sit: 4: Min assist   Balance Static Sitting Balance Static Sitting - Balance Support: No upper extremity supported Static Sitting - Level of Assistance:  7: Independent Dynamic Sitting Balance Dynamic Sitting - Balance Support: No upper extremity supported Dynamic Sitting - Level of Assistance: 5: Stand by assistance Static Standing Balance Static Standing - Balance Support: No upper extremity supported Static Standing - Level of Assistance: 5: Stand by assistance;4: Min assist Dynamic Standing Balance Dynamic Standing - Balance Support: During functional activity;No upper extremity supported Dynamic Standing - Level of Assistance: 4: Min assist Extremity/Trunk Assessment RUE Assessment RUE Assessment: Within Functional Limits RUE Strength RUE Overall Strength Comments: Noted grossly decreased strength through Rt UE.  LUE Assessment LUE Assessment: Exceptions to [facility] (decreased strength of 3-/5 throughout)   See Function Navigator  for Current Functional Status.   Refer to Care Plan for Long Term Goals  Recommendations for other services: Neuropsych and Therapeutic Recreation  Pet therapy, Kitchen group and Stress management   Discharge Criteria: Patient will be discharged from OT if patient refuses treatment 3 consecutive times without medical reason, if treatment goals not met, if there is a change in medical status, if patient makes no progress towards goals or if patient is discharged from hospital.  The above assessment, treatment plan, treatment alternatives and goals were discussed and mutually agreed upon: by patient  Gypsy Decant 07/22/2016, 4:28 PM

## 2016-07-22 NOTE — Care Management Note (Signed)
Inpatient Rehabilitation Center Individual Statement of Services  Patient Name:  Catherine [name]  Date:  07/22/2016  Welcome to the [facility].  Our goal is to provide you with an individualized program based on your diagnosis and situation, designed to meet your specific needs.  With this comprehensive rehabilitation program, you will be expected to participate in at least 3 hours of rehabilitation therapies Monday-Friday, with modified therapy programming on the weekends.  Your rehabilitation program will include the following services:  Physical Therapy (PT), Occupational Therapy (OT), Speech Therapy (ST), 24 hour per day rehabilitation nursing, Therapeutic Recreaction (TR), Case Management (Social Worker), Rehabilitation Medicine, Nutrition Services and Pharmacy Services  Weekly team conferences will be held on Wednesday to discuss your progress.  Your Social Worker will talk with you frequently to get your input and to update you on team discussions.  Team conferences with you and your family in attendance may also be held.  Expected length of stay: 10-12 days Overall anticipated outcome: supervision/mod/I level  Depending on your progress and recovery, your program may change. Your Social Worker will coordinate services and will keep you informed of any changes. Your Social Worker's name and contact numbers are listed  below.  The following services may also be recommended but are not provided by the [facility] will be made to provide these services after discharge if needed.  Arrangements include referral to agencies that provide these services.  Your insurance has been verified to be:  [place] Your primary doctor is:  Josue Hector  Pertinent information will be shared with your doctor and your insurance  company.  Social Worker:  Ovidio Kin, [place] or [phone]  Information discussed with and copy given to patient by: Elease Hashimoto, 07/22/2016, 12:16 PM

## 2016-07-22 NOTE — Progress Notes (Signed)
Social Work Assessment and Plan Social Work Assessment and Plan  Patient Details  Name: MIRABEL AHLGREN MRN: [id number] Date of Birth: June 07, 1945  Today's Date: 07/22/2016  Problem List:  Patient Active Problem List   Diagnosis Date Noted  . Basal ganglia infarction ([place]) 07/21/2016  . Stroke (cerebrum) ([place]) 07/19/2016  . HLD (hyperlipidemia) 08/14/2014  . Obesity (BMI 30-39.9) 08/17/2013  . Hyperlipidemia LDL goal < 100 02/15/2013  . TIA (transient ischemic attack) 12/14/2012  . Essential hypertension 12/14/2012  . Other and unspecified hyperlipidemia 12/14/2012   Past Medical History:  Past Medical History:  Diagnosis Date  . Arthritis   . Cataract   . GERD (gastroesophageal reflux disease)   . TIA (transient ischemic attack)    Past Surgical History:  Past Surgical History:  Procedure Laterality Date  . EYE SURGERY     Social History:  reports that she has never smoked. She has never used smokeless tobacco. She reports that she does not drink alcohol or use drugs.  Family / Support Systems Marital Status: Widow/Widower How Long?: 2 years ago Patient Roles: Parent, Volunteer Children: Jamie-daughter [phone]-cell  Sharyn Lull [place]-daughter [phone] Other Supports: Extended family and another daughter-Allison Anticipated Caregiver: Self and her daughter's Ability/Limitations of Caregiver: Roselyn Reef works M-F 7;30-5:30 pm and her other daughter's work also. Will need to see how well she does here and go from there Caregiver Availability: Other (Comment) (Depends upon her need) Family Dynamics: Close with all of her daughter's (3) and they will be assisting her at home. She has numerous friends and church members who are involved and supportive.   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   Abuse/Neglect Physical Abuse: Denies Verbal Abuse: Denies Sexual Abuse: Denies Exploitation of patient/patient's resources: Denies Self-Neglect: Denies  Emotional Status Pt's affect, behavior adn adjustment status: Pt is motivated to regain her independence and take care of herself. She has always done this and wants to again. She voiced she is not used to sitting still and being in a bed, wants to get moving.  Recent Psychosocial Issues: Other health issues-recent loss of husband two years ago March Pyschiatric History: No history deferred depression screen due to able to verbalize her feelings and open about how she is doing. May benefit will ask team from neuro-psych while here. Substance Abuse History: No issues  Patient / Family Perceptions, Expectations & Goals Pt/Family understanding of illness & functional limitations: Pt and family are able to explain her stroke and deficits, she talks with the MD daily and feels her questions and concerns are being addressed. Daughter's are here and have spoken with the MD also. Premorbid pt/family roles/activities: Mother, grandmother, widower, church member, retiree, etc Anticipated changes in roles/activities/participation: resume Pt/family expectations/goals: Pt states: " I want to get back to doing for myself, that is my goal."  Roselyn Reef states: " If there is a will there is a way."  Sharyn Lull states: " We will do whatever is needed, she is our [employer]."  [employer]: None Premorbid Home Care/DME Agencies: Other (Comment) (has DME from previous stays) Transportation available at discharge: family Resource referrals recommended: Support group (specify)  Discharge Planning Living Arrangements: Children Support Systems: Children, Other  relatives, Friends/neighbors, Church/faith community Type  of Residence: Private residence [employer] Resources: [employer], Multimedia programmer (specify) Nurse, mental health) Financial Resources: Social Security, Family Support Financial Screen Referred: No Living Expenses: Own Money Management: Patient Does the patient have any problems obtaining your medications?: No Home Management: Patient and daughter Patient/Family Preliminary Plans: Return home with daughter who is there in the evenings and they are working on a plan if pt were to need 24 hr care. her other daughter-Michelle can take some time off from work and if longer needed [place] [facility] from [employer] can help out. 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 Her three daughter's are involved and willing to do what they can to assist her. Awaiting team's evaluations to come up With a safe plan for Mom. May benefit from neuro-psych while here for grief counseling on loss of husband. Follow to provide support and work on a realistic discharge plan. Made family aware most of our patient's need 24 hr care iniaitally.  Elease Hashimoto 07/22/2016, 12:31 PM

## 2016-07-22 NOTE — Progress Notes (Signed)
Patient information reviewed and entered into eRehab system by Aalia Greulich, RN, CRRN, PPS Coordinator.  Information including medical coding and functional independence measure will be reviewed and updated through discharge.     Per nursing patient was given "Data Collection Information Summary for Patients in Inpatient Rehabilitation Facilities with attached "Privacy Act Statement-Health Care Records" upon admission.  

## 2016-07-22 NOTE — Progress Notes (Signed)
Retta Diones, RN Rehab Admission Coordinator Signed Physical Medicine and Rehabilitation  PMR Pre-admission Date of Service: 07/21/2016 2:59 PM  Related encounter: ED to Hosp-Admission (Discharged) from 07/19/2016 in [place]       [] Hide copied text PMR Admission Coordinator Pre-Admission Assessment  Patient: Catherine [name] is an 71 y.o., female MRN: [id number] DOB: 07-Jan-1946 Height: 5\' 3"  (160 cm) Weight: 79.9 kg (176 lb 2.4 oz)                                                                                                                                                  Insurance Information HMO: No   PPO:       PCP:       IPA:       80/20:       OTHER:   PRIMARY:  Medicare A/B      Policy#:  [id number]      Subscriber:  Trinna Balloon CM Name:        Phone#:       Fax#:   Pre-Cert#:        Employer:   Benefits:  Phone #:       Name: Checked in [place]. Date: A=01/25/09 and B=01/25/14     Deduct: $1340      Out of Pocket Max: None      Life Max:  Unlimited CIR: 100%      SNF: 100 days Outpatient: 80%     Co-Pay: 20% Home Health: 100%      Co-Pay: none DME: 80%     Co-Pay: 20% Providers: patient's choice  SECONDARY:  BCBS supplement      Policy#: [id number]      Subscriber:  Trinna Balloon CM Name:        Phone#:       Fax#:   Pre-Cert#:        Employer:   Benefits:  Phone #: [phone]     Name:   Eff. Date:       Deduct:        Out of Pocket Max:        Life Max:   CIR:        SNF:   Outpatient:       Co-Pay:   Home Health:        Co-Pay:   DME:       Co-Pay:    Note:  Secondary insurance:  Changes to [place] [employer] [place] on 07/26/16 per patient  Emergency [place]    Name Relation Home Work [place] Daughter [phone]     Tidwell,Michelle Daughter [phone]  [phone]     Current Medical History  Patient Admitting Diagnosis:  R BG  infarct  History  of Present Illness:A 71 y.o.right handed femalewith history of TIA 2014 maintained on aspirin, hyperlipidemia. Presented 07/19/2016 with unsteady gait, left-sided weakness and speech difficulty. Per chart review patient lives with daughter and 31 year old grandson. Independent prior to admissionand very active. One level home. CT/MRI showed patchy small volume acute ischemic nonhemorrhagic right basal ganglia infarct.Small remote left thalamic lacunar infarct. CT angiogram of head and neck showed single short segment severe left A2 stenosis otherwise negative CTA. Patient did not receive TPA. EEG negative for seizure. Echocardiogram pending. Neurology consulted currently on Plavix for CVA prophylaxis. Subcutaneous Lovenox for DVT prophylaxis. Tolerating a regular diet. Physical therapy evaluation completed with recommendations of physical medicine rehabilitation consult.    Total: 4=NIH  Past Medical History      Past Medical History:  Diagnosis Date  . Arthritis   . Cataract   . GERD (gastroesophageal reflux disease)   . TIA (transient ischemic attack)     Family History  family history includes Cancer in her father and mother; Dementia in her mother; Stroke in her mother.  Prior Rehab/Hospitalizations: No previous rehab admissions.  Has the patient had major surgery during 100 days prior to admission? No  Current Medications   Current Facility-Administered Medications:  .  acetaminophen (TYLENOL) tablet 650 mg, 650 mg, Oral, Q4H PRN, 650 mg at 07/21/16 1058 **OR** acetaminophen (TYLENOL) solution 650 mg, 650 mg, Per Tube, Q4H PRN **OR** acetaminophen (TYLENOL) suppository 650 mg, 650 mg, Rectal, Q4H PRN, Bonnielee Haff, MD .  clopidogrel (PLAVIX) tablet 75 mg, 75 mg, Oral, Daily, Rosalin Hawking, MD, 75 mg at 07/21/16 1058 .  enoxaparin (LOVENOX) injection 40 mg, 40 mg, Subcutaneous, Q24H, Bonnielee Haff, MD, 40 mg at 07/20/16 2225 .  senna-docusate  (Senokot-S) tablet 1 tablet, 1 tablet, Oral, QHS PRN, Bonnielee Haff, MD .  simvastatin (ZOCOR) tablet 40 mg, 40 mg, Oral, QHS, Rosalin Hawking, MD  Patients Current Diet: Diet Heart Room service appropriate? Yes; Fluid consistency: Thin Diet - low sodium heart healthy  Precautions / Restrictions Precautions Precautions: Fall Restrictions Weight Bearing Restrictions: No   Has the patient had 2 or more falls or a fall with injury in the past year?Yes.  Fell on the ice 01/18 and fell in the chicken pen with soreness after falls.  Prior Activity Level Community (5-7x/wk): Went out 3-4 X a week.  Travels 2 hrs to see her 20 yo mom.  Home Assistive Devices / Equipment Home Assistive Devices/Equipment: None Home Equipment: Cane - single point, Environmental consultant - 2 wheels, Shower seat, Bedside commode  Prior Device Use: Indicate devices/aids used by the patient prior to current illness, exacerbation or injury? 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  Independent  Indoor Mobility: Did the patient need assistance with walking from room to room (with or without device)? Independent  Stairs: Did the patient need assistance with internal or external stairs (with or without device)? Independent  Functional Cognition: Did the patient need help planning regular tasks such as shopping or remembering to take medications? Independent  Current Functional Level Cognition  Arousal/Alertness: Awake/alert Overall Cognitive Status: No family/caregiver present to determine baseline cognitive functioning Orientation Level: Oriented X4 General Comments: Required increased time for processing but was able to  complete higher level cognitive tasks when increased time was provided. Plan to assess financial management tasks next session. Attention: Sustained Sustained Attention: Impaired Sustained Attention Impairment: Verbal complex Memory: Impaired Memory Impairment: Retrieval deficit Awareness: Appears intact Problem Solving: Appears intact Safety/Judgment: Appears intact    Extremity Assessment (includes Sensation/Coordination)  Upper Extremity Assessment: LUE deficits/detail LUE Deficits / Details: Shoulder flexion 4-/5, elbow flexion and extension 4/5, grasp 4/5. Decreased light touch sensation. Signs of dysmetria with finger to nose, decreased fine motor coordination, and slowed movement with rapid pronation/supination. LUE Sensation: decreased light touch LUE Coordination: decreased fine motor, decreased gross motor  Lower Extremity Assessment: Defer to PT evaluation LLE Deficits / Details: AROM WFL, strength hip flexion 4-/5, knee extension 4/5, ankle DF 4-/5, slowed coord with toe tapping    ADLs  Overall ADL's : Needs assistance/impaired Eating/Feeding: Sitting, Minimal assistance Eating/Feeding Details (indicate cue type and reason): Educated on checking for pocketing frequently due to L sided facial droop and decreased oral motor strength and sensation. Requires assistance to cut food and manipulate utensils. Grooming: Minimal assistance, Standing Upper Body Bathing: Sitting, Min guard Lower Body Bathing: Min guard, Sit to/from stand Upper Body Dressing : Minimal assistance, Sitting Lower Body Dressing: Sit to/from stand, Minimal assistance Toilet Transfer: Minimal assistance, Ambulation, RW, BSC Toilet Transfer Details (indicate cue type and reason): Simulated in room from bed to chair Toileting- Clothing Manipulation and Hygiene: Minimal assistance, Sit to/from stand Functional mobility during ADLs: Minimal assistance, Rolling walker General ADL Comments: Pt with difficulty  donning glasses due to decreased L UE fine motor coordination.    Mobility  Overal bed mobility: Needs Assistance Bed Mobility: Supine to Sit Supine to sit: Min guard General bed mobility comments: up in chair    Transfers  Overall transfer level: Needs assistance Equipment used: Rolling walker (2 wheeled) Transfers: Sit to/from Stand Sit to Stand: Min guard General transfer comment: steadying assist    Ambulation / Gait / Stairs / Wheelchair Mobility  Ambulation/Gait Ambulation/Gait assistance: Museum/gallery curator (Feet): 110 Feet Assistive device: Rolling walker (2 wheeled) Gait Pattern/deviations: Step-through pattern, Decreased stride length, Steppage, Drifts right/left, Wide base of support General Gait Details: over weight shifts to L and demonstrates instability esp with turns Gait velocity: slow pace    Posture / Balance Dynamic Sitting Balance Sitting balance - Comments: Sitting EOB no back support Balance Overall balance assessment: Needs assistance Sitting-balance support: No upper extremity supported, Feet supported Sitting balance-Leahy Scale: Good Sitting balance - Comments: Sitting EOB no back support Standing balance support: No upper extremity supported, Bilateral upper extremity supported, During functional activity Standing balance-Leahy Scale: Fair Standing balance comment: Able to statically stand at sink without UE support briefly during ADL tasks.    Special needs/care consideration BiPAP/CPAP No CPM No Continuous Drip IV No Dialysis No   Life Vest No Oxygen No Special Bed No Trach Size No Wound Vac (area) No     Skin No                              Bowel mgmt: Last BM 07/21/16 per patient Bladder mgmt: Voiding in bathroom with assist Diabetic mgmt No    Previous Home Environment Living Arrangements: Children  Lives With: Other (Comment) (daughter lives with her mother) Available Help at Discharge: Family, Available  PRN/intermittently Type of Home: House Home Layout: One level Home Access: Stairs to enter [employer] of Steps: 1 small step  Bathroom Shower/Tub: Multimedia programmer: Handicapped height Home Care Services: No  Discharge Living Setting Plans for Discharge Living Setting: Patient's home, House, Lives with (comment) (Lives with a daughter and a grandson.) Type of Home at Discharge: House Discharge Home Layout: One level Discharge Home Access: Stairs to enter Entrance Stairs-Number of Steps: 1+1 small steps Does the patient have any problems obtaining your medications?: No  Social/Family/Support Systems Patient Roles: Parent (Has 3 daughters.) Contact Information: Ferne Reus - daughter Anticipated Caregiver: self and daughters Anticipated Caregiver's Contact Information: Roselyn Reef - daughter - [phone] Ability/Limitations of Caregiver: Daughter works, has 2 other daughters and a grandson. Caregiver Availability: Intermittent Discharge Plan Discussed with Primary Caregiver: Yes Is Caregiver In Agreement with Plan?: Yes Does Caregiver/Family have Issues with Lodging/Transportation while Pt is in Rehab?: No  Goals/Additional Needs Patient/Family Goal for Rehab: PT/OT/SLP Mod I goals Expected length of stay: 8-13 days Cultural Considerations: [employer] Dietary Needs: Heart diet, thin liquids Equipment Needs: TBD Pt/Family Agrees to Admission and willing to participate: Yes (Spoke with patient and her daughter at bedside.) Program Orientation Provided & Reviewed with Pt/Caregiver Including Roles  & Responsibilities: Yes  Decrease burden of Care through IP rehab admission: N/A  Possible need for SNF placement upon discharge: Not anticipated  Patient Condition: This patient's condition remains as documented in the consult dated 07/20/16, in which the Rehabilitation Physician determined and documented that the patient's condition is appropriate for intensive  rehabilitative care in an inpatient rehabilitation facility. Will admit to inpatient rehab today.  Preadmission Screen Completed By:  Retta Diones, 07/21/2016 3:15 PM ______________________________________________________________________   Discussed status with Dr. Naaman Plummer on 07/21/16 at 1510 and received telephone approval for admission today.  Admission Coordinator:  Retta Diones, time 1514/Date 07/21/16       Cosigned by:

## 2016-07-22 NOTE — Progress Notes (Signed)
Meredith Staggers, MD Physician Signed Physical Medicine and Rehabilitation  Consult Note Date of Service: 07/20/2016 2:14 PM  Related encounter: ED to Hosp-Admission (Discharged) from 07/19/2016 in [place] All Collapse All   [] Hide copied text [] Hover for attribution information      Physical Medicine and Rehabilitation Consult Reason for Consult: Acute ischemic nonhemorrhagic right basal ganglia infarct Referring Physician: [facility]   HPI: Catherine [name] is a 71 y.o. right handed female with history of TIA 2014 maintained on aspirin, hyperlipidemia. Presented 07/19/2016 with unsteady gait, left-sided weakness and speech difficulty. Per chart review patient lives with daughter and 77 year old grandson. Independent  prior to admission and very active. One level home. CT/MRI showed patchy small volume acute ischemic nonhemorrhagic right basal ganglia infarct. Small remote left thalamic lacunar infarct. CT angiogram of head and neck showed single short segment severe left A2 stenosis otherwise negative CTA. Patient did not receive TPA. EEG negative for seizure. Echocardiogram pending. Neurology consulted currently on Plavix for CVA prophylaxis. Subcutaneous Lovenox for DVT prophylaxis. Tolerating a regular diet. Physical therapy evaluation completed with recommendations of physical medicine rehabilitation consult.   Review of Systems  Constitutional: Negative for chills and fever.  HENT: Negative for hearing loss.   Eyes: Negative for blurred vision and double vision.  Respiratory: Negative for cough and shortness of breath.   Cardiovascular: Negative for chest pain, palpitations and leg swelling.  Gastrointestinal:       GERD  Genitourinary: Negative for dysuria, flank pain and hematuria.  Musculoskeletal: Positive for joint pain and myalgias.  Skin: Negative for rash.  Neurological: Positive for sensory change, speech change,  focal weakness and headaches. Negative for seizures.  All other systems reviewed and are negative.      Past Medical History:  Diagnosis Date  . Arthritis   . Cataract   . GERD (gastroesophageal reflux disease)   . TIA (transient ischemic attack)         Past Surgical History:  Procedure Laterality Date  . EYE SURGERY          Family History  Problem Relation Age of Onset  . Dementia Mother   . Cancer Mother   . Stroke Mother   . Cancer Father    Social History:  reports that she has never smoked. She has never used smokeless tobacco. She reports that she does not drink alcohol or use drugs. Allergies: No Known Allergies       Medications Prior to Admission  Medication Sig Dispense Refill  . ALOE VERA PO Take 1 tablet by mouth daily as needed (INFLAMMATION).     Marland Kitchen aspirin EC 81 MG EC tablet Take 1 tablet (81 mg total) by mouth daily. 30 tablet 11  . Calcium Citrate (CITRACAL PO) Take 1 tablet by mouth daily.    Marland Kitchen CINNAMON PO Take 1 tablet by mouth daily.    . Cyanocobalamin (B-12 PO) Take 1 tablet by mouth daily.    . Ginkgo Biloba (GINKGO PO) Take 60 mg by mouth daily. Take 60 mg daily    . naproxen sodium (ANAPROX) 220 MG tablet Take 220 mg by mouth 2 (two) times daily as needed (PAIN).    Marland Kitchen OVER THE COUNTER MEDICATION Take 1 application by mouth daily as needed (NASAL RASH).    Marland Kitchen PROAIR HFA 108 (90 BASE) MCG/ACT inhaler Inhale 1-2 puffs into the lungs as needed for shortness of breath.     Marland Kitchen  simvastatin (ZOCOR) 20 MG tablet Take 20 mg by mouth at bedtime.    . TEA TREE OIL EX Apply 1 application topically daily as needed (FOR NASAL RASH).      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, etc; slipped on ice recently, but no injury; family report they can provide initial 21 hour assist ay d/c Functional Status:  Mobility: Bed Mobility General bed mobility comments: up in chair Transfers Overall transfer level: Needs assistance Equipment used: Rolling walker (2 wheeled) Transfers: Sit to/from Stand Sit to Stand: Min guard General transfer comment: steadying assist Ambulation/Gait Ambulation/Gait assistance: Min assist Ambulation Distance (Feet): 110 Feet Assistive device: Rolling walker (2 wheeled) Gait Pattern/deviations: Step-through pattern, Decreased stride length, Steppage, Drifts right/left, Wide base of support General Gait Details: over weight shifts to L and demonstrates instability esp with turns Gait velocity: slow pace  ADL:  Cognition: Cognition Overall Cognitive Status: Within Functional Limits for tasks assessed Arousal/Alertness: Awake/alert Orientation Level: Oriented X4 Attention: Sustained Sustained Attention: Impaired Sustained Attention Impairment: Verbal complex Memory: Impaired Memory Impairment: Retrieval deficit Awareness: Appears intact Problem Solving: Appears intact Safety/Judgment: Appears intact Cognition Arousal/Alertness: Awake/alert Behavior During Therapy: WFL for tasks assessed/performed Overall Cognitive Status: Within Functional Limits for tasks assessed  Blood pressure (!) 151/76, pulse 67, temperature 97.7 F (36.5 C), temperature source Oral, resp. rate 18, height 5\' 3"  (1.6 m), weight 79.9 kg (176 lb 2.4 oz), SpO2 97 %. Physical Exam  Vitals  reviewed. Constitutional: She is oriented to person, place, and time.  HENT:  Left facial droop  Eyes: EOM are normal.  Neck: Normal range of motion. Neck supple. No thyromegaly present.  Cardiovascular: Normal rate and regular rhythm.   Respiratory: Effort normal and breath sounds normal. No respiratory distress.  GI: Soft. Bowel sounds are normal. She exhibits no distension.  Neurological: She is alert and oriented to person, place, and time.  Follows full commands. Good insight and awareness. STM functional. Left central 7 and tongue deviation. Otherwise CN intact. Speech dysarthric but intellgible. LUE/LLE:  3-/5 deltoid, 3-/5 bicep, 3-/5 tricep, 3/5 wrist extension, 3-/5 hand intrinsics.      3/5 hip flexor, 3/5 knee extension, 4/5 ankle dorsiflexion, 4/5 ankle plantarflexion. Sensation 1+/2 LUE and LLE.    Skin: Skin is warm and dry.  Psychiatric: She has a normal mood and affect. Judgment and thought content normal.        Assessment/Plan: Diagnosis: Right basal ganglia infarct with left hemiparesis 1. Does the need for close, 24 hr/day medical supervision in concert with the patient's rehab needs make it unreasonable for this patient to be served in a less intensive setting? Yes 2. Co-Morbidities requiring supervision/potential complications: HTn, post-stroke sequelae 3. Due to bladder management, bowel management, safety, skin/wound care, disease management, medication administration, pain management and patient education, does the patient require 24 hr/day rehab nursing? Yes 4. Does the patient require coordinated care of a physician, rehab nurse, PT (1-2 hrs/day, 5 days/week), OT (1-2 hrs/day, 5 days/week) and SLP (  1-2 hrs/day, 5 days/week) to address physical and functional deficits in the context of the above medical diagnosis(es)? Yes Addressing deficits in the following areas: balance, endurance, locomotion, strength, transferring, bowel/bladder control, bathing, dressing,  feeding, grooming, toileting, speech and psychosocial support 5. Can the patient actively participate in an intensive therapy program of at least 3 hrs of therapy per day at least 5 days per week? Yes 6. The potential for patient to make measurable gains while on inpatient rehab is excellent 7. Anticipated functional outcomes upon discharge from inpatient rehab are modified independent  with PT, modified independent with OT, modified independent with SLP. 8. Estimated rehab length of stay to reach the above functional goals is: 8-13 days 9. Does the patient have adequate social supports and living environment to accommodate these discharge functional goals? Yes 10. Anticipated D/C setting: Home 11. Anticipated post D/C treatments: HH therapy and Outpatient therapy 12. Overall Rehab/Functional Prognosis: excellent  RECOMMENDATIONS: This patient's condition is appropriate for continued rehabilitative care in the following setting: CIR Patient has agreed to participate in recommended program. Yes Note that insurance prior authorization may be required for reimbursement for recommended care.  Comment: Excellent candidate. Rehab Admissions Coordinator to follow up.  Thanks,  Meredith Staggers, MD, Mellody Drown    Cathlyn Parsons., PA-C 07/20/2016    Revision History                        Routing History

## 2016-07-22 NOTE — Progress Notes (Signed)
Physical Therapy Assessment and Plan  Patient Details  Name: Catherine [name] MRN: [id number] Date of Birth: 02-Oct-1945  PT Diagnosis: Abnormal posture, Abnormality of gait, Coordination disorder, Difficulty walking, Hemiplegia non-dominant, Impaired sensation and Muscle weakness Rehab Potential: Excellent ELOS: 10-14 days   Today's Date: 07/22/2016 PT Individual Time: 0830-0959 PT Individual Time Calculation (min): 89 min    Problem List:  Patient Active Problem List   Diagnosis Date Noted  . Basal ganglia infarction ([place]) 07/21/2016  . Stroke (cerebrum) ([place]) 07/19/2016  . HLD (hyperlipidemia) 08/14/2014  . Obesity (BMI 30-39.9) 08/17/2013  . Hyperlipidemia LDL goal < 100 02/15/2013  . TIA (transient ischemic attack) 12/14/2012  . Essential hypertension 12/14/2012  . Other and unspecified hyperlipidemia 12/14/2012    Past Medical History:  Past Medical History:  Diagnosis Date  . Arthritis   . Cataract   . GERD (gastroesophageal reflux disease)   . TIA (transient ischemic attack)    Past Surgical History:  Past Surgical History:  Procedure Laterality Date  . EYE SURGERY      Assessment & Plan Clinical Impression: [name] a 71 y.o.RHfemalewith history of TIA 2014, OA , GERD who was admitted on 07/19/2016 with unsteady gait, left-sided weakness and speech difficulty. MRI brain done revealing small right basal ganglia infarct with remote left thalamic lacunar infract. EEG without evidence of seizure. 2 D echo done today. CTA head/neck revealed short segment severe left A2 segment. Dr. Erlinda Hong recommends changing ASA to plavix for stroke secondary to small vessel disease and and increasing statin dose.  Patient transferred to CIR on 07/21/2016 .   Patient currently requires min with mobility secondary to muscle weakness, impaired timing and sequencing, decreased coordination and decreased motor planning and decreased sitting balance, decreased standing balance,  decreased postural control, hemiplegia and decreased balance strategies.  Prior to hospitalization, patient was independent  with mobility and lived with Family in a House home.  Home access is 2Stairs to enter.  Patient will benefit from skilled PT intervention to maximize safe functional mobility and minimize fall risk for planned discharge home with intermittent assist.  Anticipate patient will benefit from follow up [facility] at discharge.  PT - End of Session Activity Tolerance: Tolerates 30+ min activity with multiple rests Endurance Deficit: Yes Endurance Deficit Description: rest breaks taken throughout session.  PT Assessment Rehab Potential (ACUTE/IP ONLY): Excellent PT Plan PT Intensity: Minimum of 1-2 x/day ,45 to 90 minutes PT Frequency: 5 out of 7 days PT Duration Estimated Length of Stay: 10-14 days PT Treatment/Interventions: Ambulation/gait training;Balance/vestibular training;Discharge planning;Community reintegration;Disease management/prevention;DME/adaptive equipment instruction;Functional mobility training;Neuromuscular re-education;Patient/family education;Psychosocial support;Stair training;Therapeutic Activities;Therapeutic Exercise;UE/LE Strength taining/ROM;UE/LE Coordination activities;Wheelchair propulsion/positioning PT Recommendation Recommendations for Other Services: Neuropsych consult;Therapeutic Recreation consult Therapeutic Recreation Interventions: Pet therapy;Kitchen group;Outing/community reintergration Follow Up Recommendations: Home health PT Patient destination: Home Equipment Recommended: To be determined Equipment Details: Pt reports having cane and rw at home.   Skilled Therapeutic Intervention Pt seen for initial evaluation and treatment. PTA the pt reports being independent with all activities and enjoyed taking care of the animal [place] her [place]. She states that she was able to lift a 50 lb feed bag when needed. Her daughter and 68 y.o. grandson do live  with her but are gone during the day. She states that she does have other family and friends available to help her if needed. Pt is motivated to return to her active lifestyle and was engaged throughout the PT session. The pt was able to demonstrate supervision  level bed mobility (improvement with cues for technique). Transfers are being performed at min assist to min guard level with cues for hand placement and safety. Improved stability and decreased assist needed when using UEs and assistive device. Ambulation was performed using rw at this time, multimodal cue provided for posture and safety as needed (60 ft). Multiple cues were needed during the session for the pt to attend to her Rt UE primarily when sitting in w/c and arm slipping off side of armrest. Static and dynamic balance activities were performed with decreased stability dynamically. Deficits in strength, sensation, and coordination were present through the Rt LE. Following session the pt was up in w/c with all needs in reach.    PT Evaluation Precautions/Restrictions Precautions Precautions: Fall Restrictions Weight Bearing Restrictions: No General   Vital Signs Pain   Home Living/Prior Functioning Home Living Living Arrangements: Children Available Help at Discharge: Family;Friend(s);Available 24 hours/day Type of Home: House Home Access: Stairs to enter [employer] of Steps: 2 Entrance Stairs-Rails: None Home Layout: One level Bathroom Shower/Tub: Walk-in shower Additional Comments: daughter and grandson live with pt. Daughter works during the day but pt does have other family and friends who can stay with her if needed.   Lives With: Family Prior Function Level of Independence: Independent with homemaking with ambulation  Able to Take Stairs?: Yes Driving: Yes Vocation: Retired Leisure:  (takes care of animals on her ranch) Comments: Reports lifting 50 lb bags of feed PTA. Takes care of her animals during  the day. Likes to stay active.  Vision/Perception     Cognition Overall Cognitive Status: Within Functional Limits for tasks assessed Arousal/Alertness: Awake/alert Orientation Level: Oriented X4 Attention: Sustained Awareness: Appears intact Safety/Judgment: Appears intact Sensation Sensation Light Touch: Impaired by gross assessment (through Rt UE/LE) Coordination Gross Motor Movements are Fluid and Coordinated: No Fine Motor Movements are Fluid and Coordinated: No Coordination and Movement Description: Decreased coordination noted through [place]. Decreased alternating movements with toe tapping on Rt Heel Shin Test: decreaed accuracy with Rt LE Motor  Motor Motor: Hemiplegia Motor - Skilled Clinical Observations: Decreased strength through Rt LE.   Mobility   Locomotion:   Pt ambulating 60 ft with rolling walker and min assist.     Balance Static Sitting Balance Static Sitting - Balance Support: No upper extremity supported Static Sitting - Level of Assistance: 7: Independent Dynamic Sitting Balance Dynamic Sitting - Balance Support: No upper extremity supported Dynamic Sitting - Level of Assistance: 5: Stand by assistance Static Standing Balance Static Standing - Balance Support: No upper extremity supported Static Standing - Level of Assistance: 5: Stand by assistance Dynamic Standing Balance Dynamic Standing - Balance Support: Bilateral upper extremity supported Extremity Assessment  RUE Assessment RUE Assessment: Exceptions to [facility] RUE Strength RUE Overall Strength Comments: Noted grossly decreased strength through Rt UE.    RLE Assessment RLE Assessment: Exceptions to [facility] RLE Strength RLE Overall Strength Comments: Dorsiflexion 3+/5, knee extension 4-/5, knee flexion 4-/5, hip flexion 4-/5.      See Function Navigator for Current Functional Status.   Refer to Care Plan for Long Term Goals  Recommendations for other services: Neuropsych and  Therapeutic Recreation  Pet therapy, Kitchen group and Outing/community reintegration  Discharge Criteria: Patient will be discharged from PT if patient refuses treatment 3 consecutive times without medical reason, if treatment goals not met, if there is a change in medical status, if patient makes no progress towards goals or if patient is discharged from  hospital.  The above assessment, treatment plan, treatment alternatives and goals were discussed and mutually agreed upon: by patient  Linard Millers, PT 07/22/2016, 4:01 PM

## 2016-07-23 ENCOUNTER — Inpatient Hospital Stay (HOSPITAL_COMMUNITY): Payer: Medicare Other | Admitting: Occupational Therapy

## 2016-07-23 ENCOUNTER — Inpatient Hospital Stay (HOSPITAL_COMMUNITY): Payer: Medicare Other | Admitting: Speech Pathology

## 2016-07-23 ENCOUNTER — Inpatient Hospital Stay (HOSPITAL_COMMUNITY): Payer: Medicare Other | Admitting: Physical Therapy

## 2016-07-23 DIAGNOSIS — G8194 Hemiplegia, unspecified affecting left nondominant side: Secondary | ICD-10-CM

## 2016-07-23 DIAGNOSIS — K219 Gastro-esophageal reflux disease without esophagitis: Secondary | ICD-10-CM

## 2016-07-23 NOTE — Progress Notes (Signed)
Speech Language Pathology Daily Session Note  Patient Details  Name: Catherine [name] MRN: [id number] Date of Birth: May 14, 1945  Today's Date: 07/23/2016 SLP Individual Time: 1410-1455 SLP Individual Time Calculation (min): 45 min  Short Term Goals: Week 1: SLP Short Term Goal 1 (Week 1): Pt will utilize speech intelligibility strategies with Min A to increase speech intelligilbity to >90% at the connected sentence level during unstructured task.  SLP Short Term Goal 2 (Week 1): Pt will utilize increased vocal intensity with Min A to to achieve > 90% intelligibility in moderately noisey environment.  SLP Short Term Goal 3 (Week 1): Pt will effectively describe unknown information in barrier tasks with >90% intelligibility and Min A verbal cues.   Skilled Therapeutic Interventions: Skilled treatment session focused on speech intelligibility goals. SLP facilitated session by providing Min A verbal cues to use speech intelligibility stratgies at the simple sentence level. Speech intelligibility strategies introduced, handout given. Pt voiced understanding of strategies. She was able to implement at the sentence level during a barrier communication tasks with Mod A to Min A verbal cues and 100% intelligibility. Pt voiced that she was overwhelmed and discouraged by how much work talking needed. Encouragement and emotional support was given. Pt was returned to room, left upright in wheelchair and all needs within reach. Continue per current plan of care.      Function:    Cognition Comprehension Comprehension assist level: Understands complex 90% of the time/cues 10% of the time  Expression   Expression assist level: Expresses complex 90% of the time/cues < 10% of the time  Social Interaction Social Interaction assist level: Interacts appropriately with others - No medications needed.  Problem Solving Problem solving assist level: Solves complex problems: Recognizes & self-corrects  Memory Memory  assist level: Complete Independence: No helper    Pain    Therapy/Group: Individual Therapy  Catherine [name] B. Rutherford Nail, M.S., [place] 07/23/2016, 3:31 PM

## 2016-07-23 NOTE — Progress Notes (Signed)
Occupational Therapy Session Note  Patient Details  Name: Catherine [name] MRN: [id number] Date of Birth: 15-Oct-1945  Today's Date: 07/23/2016 OT Individual Time: 2567-2091 OT Individual Time Calculation (min): 60 min    Short Term Goals: Week 1:  OT Short Term Goal 1 (Week 1): STGs=LTGS secondary to estimated LOS  Skilled Therapeutic Interventions/Progress Updates:    Pt seen for BADL retraining with a focus on LUE strength, balance, and adaptive techniques.  Pt ambulated with RW with steadying A to shower stall to undress.  Showered with A for feet (will need long sponge). She was able to maintain grasp on washcloth to wash R arm.  Sit to stand in shower with bar with close S.  Dried off and then ambulated back to w/c to dress. Min a with bra and A to start pants on L leg and socks.  Next session, it will be easier for pt to sit in lower arm chair so she will have more balance to reach her feet.  Her limited pinch strength makes pulling underwear, pants and socks on very difficult for her.  At the end of the session, provided pt with soft foam block and soft theraputty to work on pinch strength with. Demonstrated exercises with return demonstration of exercises. Pt tolerated session well with no LOB or fatigue.  Pt resting in w/c with all needs met.  Therapy Documentation Precautions:  Precautions Precautions: Fall Restrictions Weight Bearing Restrictions: No Pain: no c/o pain   ADL:    See Function Navigator for Current Functional Status.   Therapy/Group: Individual Therapy  [place] 07/23/2016, 1:16 PM

## 2016-07-23 NOTE — Progress Notes (Signed)
Subjective/Complaints: No issues overnite  ROS  Neg CP, SOB, N/V/D  Objective: Vital Signs: Blood pressure (!) 135/52, pulse (!) 59, temperature 97.8 F (36.6 C), temperature source Oral, resp. rate 17, weight 87.6 kg (193 lb 2 oz), SpO2 97 %. No results found. Results for orders placed or performed during the hospital encounter of 07/21/16 (from the past 72 hour(s))  Comprehensive metabolic panel     Status: Abnormal   Collection Time: 07/22/16  6:13 AM  Result Value Ref Range   Sodium 140 135 - 145 mmol/L   Potassium 3.6 3.5 - 5.1 mmol/L   Chloride 103 101 - 111 mmol/L   CO2 29 22 - 32 mmol/L   Glucose, Bld 102 (H) 65 - 99 mg/dL   BUN 14 6 - 20 mg/dL   Creatinine, Ser 0.70 0.44 - 1.00 mg/dL   Calcium 9.3 8.9 - 10.3 mg/dL   Total Protein 6.4 (L) 6.5 - 8.1 g/dL   Albumin 3.7 3.5 - 5.0 g/dL   AST 21 15 - 41 U/L   ALT 18 14 - 54 U/L   Alkaline Phosphatase 73 38 - 126 U/L   Total Bilirubin 1.0 0.3 - 1.2 mg/dL   GFR calc non Af Amer >60 >60 mL/min   GFR calc Af Amer >60 >60 mL/min    Comment: (NOTE) The eGFR has been calculated using the CKD EPI equation. This calculation has not been validated in all clinical situations. eGFR's persistently <60 mL/min signify possible Chronic Kidney Disease.    Anion gap 8 5 - 15  CBC WITH DIFFERENTIAL     Status: None   Collection Time: 07/22/16  6:13 AM  Result Value Ref Range   WBC 5.4 4.0 - 10.5 K/uL   RBC 4.57 3.87 - 5.11 MIL/uL   Hemoglobin 13.4 12.0 - 15.0 g/dL   HCT 40.7 36.0 - 46.0 %   MCV 89.1 78.0 - 100.0 fL   MCH 29.3 26.0 - 34.0 pg   MCHC 32.9 30.0 - 36.0 g/dL   RDW 13.8 11.5 - 15.5 %   Platelets 235 150 - 400 K/uL   Neutrophils Relative % 56 %   Neutro Abs 3.0 1.7 - 7.7 K/uL   Lymphocytes Relative 32 %   Lymphs Abs 1.7 0.7 - 4.0 K/uL   Monocytes Relative 10 %   Monocytes Absolute 0.5 0.1 - 1.0 K/uL   Eosinophils Relative 2 %   Eosinophils Absolute 0.1 0.0 - 0.7 K/uL   Basophils Relative 0 %   Basophils Absolute  0.0 0.0 - 0.1 K/uL     HEENT: normal Cardio: RRR and no murmur Resp: CTA B/L and unlabored GI: BS positive and NT, ND Extremity:  Pulses positive and No Edema Skin:   Intact Neuro: Alert/Oriented, Abnormal Sensory mildly diminished LT on Left , Abnormal Motor 5/5 in RUE and RLE, 3/5 LUE and LLE, Abnormal FMC Ataxic/ dec FMC and Dysarthric Musc/Skel:  Other no pain with AROM of BUE and BLEs Gen NAD   Assessment/Plan: 1. Functional deficits secondary to Right basal ganglia infarct which require 3+ hours per day of interdisciplinary therapy in a comprehensive inpatient rehab setting. Physiatrist is providing close team supervision and 24 hour management of active medical problems listed below. Physiatrist and rehab team continue to assess barriers to discharge/monitor patient progress toward functional and medical goals. FIM: Function - Bathing Bathing activity did not occur: Refused  Function- Upper Body Dressing/Undressing Upper body dressing/undressing activity did not occur: Refused Function - Lower Body  Dressing/Undressing What is the patient wearing?: Non-skid slipper socks Non-skid slipper socks- Performed by patient: Don/doff right sock Non-skid slipper socks- Performed by helper: Don/doff left sock Assist for footwear: Partial/moderate assist  Function - Toileting Toileting steps completed by patient: Adjust clothing prior to toileting, Performs perineal hygiene, Adjust clothing after toileting Toileting steps completed by helper: Adjust clothing after toileting Toileting Assistive Devices: Grab bar or rail Assist level: Touching or steadying assistance (Pt.75%)  Function - Air cabin crew transfer assistive device: Grab bar Assist level to toilet: Touching or steadying assistance (Pt > 75%) Assist level from toilet: Touching or steadying assistance (Pt > 75%)  Function - Chair/bed transfer Chair/bed transfer method: Stand pivot Chair/bed transfer assist  level: Touching or steadying assistance (Pt > 75%) Chair/bed transfer assistive device: Armrests Chair/bed transfer details: Tactile cues for posture, Visual cues/gestures for precautions/safety, Visual cues/gestures for sequencing, Verbal cues for precautions/safety  Function - Locomotion: Wheelchair Type: Manual Max wheelchair distance: 110 ft Assist Level: Supervision or verbal cues Assist Level: Supervision or verbal cues Function - Locomotion: Ambulation Assistive device: Walker-rolling Max distance: 60 ft Assist level: Touching or steadying assistance (Pt > 75%) Assist level: Touching or steadying assistance (Pt > 75%) Assist level: Touching or steadying assistance (Pt > 75%) Assist level: Touching or steadying assistance (Pt > 75%)  Function - Comprehension Comprehension: Auditory Comprehension assist level: Understands complex 90% of the time/cues 10% of the time  Function - Expression Expression: Verbal Expression assist level: Expresses complex 90% of the time/cues < 10% of the time  Function - Social Interaction Social Interaction assist level: Interacts appropriately with others - No medications needed.  Function - Problem Solving Problem solving assist level: Solves complex problems: Recognizes & self-corrects  Function - Memory Memory assist level: Complete Independence: No helper Patient normally able to recall (first 3 days only): Current season, Location of own room, Staff names and faces, That he or she is in a hospital  Medical Problem List and Plan: 1. Left hemiparesis and functional deficitssecondary to right basal ganglia infarct -CIR PT, OT ,SLP 2. DVT Prophylaxis/Anticoagulation: Pharmaceutical: Lovenox no sign of DVT 3. Pain Management: tylenol prn for OA pain.  4. Mood: LCSW to follow for evaluation and support.  5. Neuropsych: This patient iscapable of making decisions on herown behalf. 6. Skin/Wound Care: Routine pressure relief  measures. 7. Fluids/Electrolytes/Nutrition: Monitor I/O.3/28 lytes nl Maintain adequate nutrition and hydration status.  8. Dyslipidemia:Now on zocor 40 mg daily.  9. GB disease/GERD: Managed by diet.   LOS (Days) 2 A FACE TO FACE EVALUATION WAS PERFORMED  Catherine [name] E 07/23/2016, 8:29 AM

## 2016-07-23 NOTE — Progress Notes (Signed)
Physical Therapy Session Note  Patient Details  Name: Catherine [name] MRN: [id number] Date of Birth: 28-Sep-1945  Today's Date: 07/23/2016 PT Individual Time: 1300-1410 and 1555-1618 PT Individual Time Calculation (min): 70 min and 23 min   Short Term Goals: Week 1:  PT Short Term Goal 1 (Week 1): Pt to mod. independent with bed mobility.  PT Short Term Goal 2 (Week 1): Pt to ambulate 150 ft with supervision and least restrictive assistive device.  PT Short Term Goal 3 (Week 1): Pt to perform basic transfers with supervision.   Skilled Therapeutic Interventions/Progress Updates:   Treatment 1: Patient in bathroom upon arrival, unable to void but performed clothing management with supervision. Ambulated from bathroom to sink using RW for hand hygiene with supervision. Gait training to and from gym using RW with supervision, verbal cues for L heel strike for improved L foot clearance and attending to LUE on RW grip. Patient demonstrates high fall risk as noted by score of 39/56 on Berg Balance Scale. Performed NuStep using BUE/BLE at level 4 x 11 min for L sided NMR, reciprocal pattern retraining, and endurance with cues to attend to LUE to maintain grip. Patient sidestepped around raised mat table to R and L while cleaning table with LUE with supervision and increased time. Patient performed transfers with and without RW with supervision. Patient left in wheelchair with SLP.   Treatment 2: Patient in wheelchair upon arrival. Transported outside for time management. Patient ambulated outside on brick surface using RW with supervision and verbal cues for LLE clearance and negotiated up/down 8 stairs using 1 rail with verbal cues for step-to pattern, BUE support on rail descending and RUE support ascending with min A overall. Patient fatigued and requested to return to bed. Patient transferred to bed using RW with supervision, transferred sit > supine with increased time, and required assistance to  reposition higher in bed. Patient left in bed with needs in reach and visitor present.   Therapy Documentation Precautions:  Precautions Precautions: Fall Restrictions Weight Bearing Restrictions: No Pain: Pain Assessment Pain Assessment: No/denies pain Balance: Balance Balance Assessed: Yes Standardized Balance Assessment Standardized Balance Assessment: Berg Balance Test Berg Balance Test Sit to Stand: Able to stand without using hands and stabilize independently Standing Unsupported: Able to stand safely 2 minutes Sitting with Back Unsupported but Feet Supported on Floor or Stool: Able to sit safely and securely 2 minutes Stand to Sit: Sits safely with minimal use of hands Transfers: Able to transfer safely, definite need of hands Standing Unsupported with Eyes Closed: Able to stand 10 seconds safely Standing Ubsupported with Feet Together: Able to place feet together independently and stand for 1 minute with supervision From Standing, Reach Forward with Outstretched Arm: Can reach forward >12 cm safely (5") From Standing Position, Pick up Object from Floor: Able to pick up shoe, needs supervision From Standing Position, Turn to Look Behind Over each Shoulder: Looks behind from both sides and weight shifts well Turn 360 Degrees: Needs close supervision or verbal cueing Standing Unsupported, Alternately Place Feet on Step/Stool: Able to complete >2 steps/needs minimal assist Standing Unsupported, One Foot in Front: Needs help to step but can hold 15 seconds Standing on One Leg: Unable to try or needs assist to prevent fall Total Score: 39/56  See Function Navigator for Current Functional Status.   Therapy/Group: Individual Therapy  Chamya Hunton, [place] 07/23/2016, 4:42 PM

## 2016-07-24 ENCOUNTER — Inpatient Hospital Stay (HOSPITAL_COMMUNITY): Payer: Medicare Other | Admitting: Physical Therapy

## 2016-07-24 ENCOUNTER — Inpatient Hospital Stay (HOSPITAL_COMMUNITY): Payer: Medicare Other | Admitting: Occupational Therapy

## 2016-07-24 ENCOUNTER — Inpatient Hospital Stay (HOSPITAL_COMMUNITY): Payer: Medicare Other | Admitting: Speech Pathology

## 2016-07-24 DIAGNOSIS — E785 Hyperlipidemia, unspecified: Secondary | ICD-10-CM

## 2016-07-24 MED ORDER — CHLORHEXIDINE GLUCONATE 4 % EX LIQD
Freq: Every day | CUTANEOUS | Status: DC | PRN
  Filled 2016-07-24: qty 15

## 2016-07-24 MED ORDER — BACITRACIN-NEOMYCIN-POLYMYXIN OINTMENT TUBE
TOPICAL_OINTMENT | Freq: Three times a day (TID) | CUTANEOUS | Status: DC
  Administered 2016-07-24 – 2016-07-26 (×8): via TOPICAL
  Administered 2016-07-27: 1 via TOPICAL
  Administered 2016-07-27 – 2016-07-29 (×6): via TOPICAL
  Filled 2016-07-24: qty 14.17

## 2016-07-24 MED ORDER — BACITRACIN-NEOMYCIN-POLYMYXIN 400-5-5000 EX OINT
TOPICAL_OINTMENT | Freq: Three times a day (TID) | CUTANEOUS | Status: DC
  Filled 2016-07-24: qty 1

## 2016-07-24 NOTE — Progress Notes (Signed)
Subjective/Complaints: RN notes rash under nose, pt states she's had it at home and used some type of medicated cream  ROS  Neg CP, SOB, N/V/D  Objective: Vital Signs: Blood pressure (!) 143/56, pulse 61, temperature 98 F (36.7 C), temperature source Oral, resp. rate 18, weight 87.6 kg (193 lb 2 oz), SpO2 97 %. No results found. Results for orders placed or performed during the hospital encounter of 07/21/16 (from the past 72 hour(s))  Comprehensive metabolic panel     Status: Abnormal   Collection Time: 07/22/16  6:13 AM  Result Value Ref Range   Sodium 140 135 - 145 mmol/L   Potassium 3.6 3.5 - 5.1 mmol/L   Chloride 103 101 - 111 mmol/L   CO2 29 22 - 32 mmol/L   Glucose, Bld 102 (H) 65 - 99 mg/dL   BUN 14 6 - 20 mg/dL   Creatinine, Ser 0.70 0.44 - 1.00 mg/dL   Calcium 9.3 8.9 - 10.3 mg/dL   Total Protein 6.4 (L) 6.5 - 8.1 g/dL   Albumin 3.7 3.5 - 5.0 g/dL   AST 21 15 - 41 U/L   ALT 18 14 - 54 U/L   Alkaline Phosphatase 73 38 - 126 U/L   Total Bilirubin 1.0 0.3 - 1.2 mg/dL   GFR calc non Af Amer >60 >60 mL/min   GFR calc Af Amer >60 >60 mL/min    Comment: (NOTE) The eGFR has been calculated using the CKD EPI equation. This calculation has not been validated in all clinical situations. eGFR's persistently <60 mL/min signify possible Chronic Kidney Disease.    Anion gap 8 5 - 15  CBC WITH DIFFERENTIAL     Status: None   Collection Time: 07/22/16  6:13 AM  Result Value Ref Range   WBC 5.4 4.0 - 10.5 K/uL   RBC 4.57 3.87 - 5.11 MIL/uL   Hemoglobin 13.4 12.0 - 15.0 g/dL   HCT 40.7 36.0 - 46.0 %   MCV 89.1 78.0 - 100.0 fL   MCH 29.3 26.0 - 34.0 pg   MCHC 32.9 30.0 - 36.0 g/dL   RDW 13.8 11.5 - 15.5 %   Platelets 235 150 - 400 K/uL   Neutrophils Relative % 56 %   Neutro Abs 3.0 1.7 - 7.7 K/uL   Lymphocytes Relative 32 %   Lymphs Abs 1.7 0.7 - 4.0 K/uL   Monocytes Relative 10 %   Monocytes Absolute 0.5 0.1 - 1.0 K/uL   Eosinophils Relative 2 %   Eosinophils  Absolute 0.1 0.0 - 0.7 K/uL   Basophils Relative 0 %   Basophils Absolute 0.0 0.0 - 0.1 K/uL     HEENT: normal Cardio: RRR and no murmur Resp: CTA B/L and unlabored GI: BS positive and NT, ND Extremity:  Pulses positive and No Edema Skin:   Maculopapular skin eruption, inferior to nasal vestibule Neuro: Alert/Oriented, Abnormal Sensory mildly diminished LT on Left , Abnormal Motor 5/5 in RUE and RLE, 3/5 LUE and LLE, Abnormal FMC Ataxic/ dec FMC and Dysarthric Musc/Skel:  Other no pain with AROM of BUE and BLEs Gen NAD   Assessment/Plan: 1. Functional deficits secondary to Right basal ganglia infarct which require 3+ hours per day of interdisciplinary therapy in a comprehensive inpatient rehab setting. Physiatrist is providing close team supervision and 24 hour management of active medical problems listed below. Physiatrist and rehab team continue to assess barriers to discharge/monitor patient progress toward functional and medical goals. FIM: Function - Bathing Bathing activity  did not occur: Refused Position: Shower Body parts bathed by patient: Right arm, Left arm, Abdomen, Chest, Front perineal area, Buttocks, Right upper leg, Left upper leg, Back Body parts bathed by helper: Right lower leg, Left lower leg Assist Level: Touching or steadying assistance(Pt > 75%)  Function- Upper Body Dressing/Undressing Upper body dressing/undressing activity did not occur: Refused What is the patient wearing?: Bra, Pull over shirt/dress Bra - Perfomed by patient: Thread/unthread right bra strap, Thread/unthread left bra strap Bra - Perfomed by helper: Hook/unhook bra (pull down sports bra) Pull over shirt/dress - Perfomed by patient: Thread/unthread right sleeve, Thread/unthread left sleeve, Pull shirt over trunk, Put head through opening Function - Lower Body Dressing/Undressing What is the patient wearing?: Underwear, Pants, Socks Position: Wheelchair/chair at sink Underwear - Performed  by patient: Thread/unthread right underwear leg Underwear - Performed by helper: Pull underwear up/down Pants- Performed by patient: Thread/unthread right pants leg Pants- Performed by helper: Thread/unthread left pants leg, Pull pants up/down Non-skid slipper socks- Performed by patient: Don/doff right sock Non-skid slipper socks- Performed by helper: Don/doff right sock, Don/doff left sock Socks - Performed by helper: Don/doff right sock, Don/doff left sock Assist for footwear: Partial/moderate assist  Function - Toileting Toileting steps completed by patient: Adjust clothing prior to toileting, Performs perineal hygiene, Adjust clothing after toileting Toileting steps completed by helper: Adjust clothing after toileting Toileting Assistive Devices: Grab bar or rail Assist level: Touching or steadying assistance (Pt.75%)  Function - Toilet Transfers Toilet transfer assistive device: Grab bar Assist level to toilet: Touching or steadying assistance (Pt > 75%) Assist level from toilet: Touching or steadying assistance (Pt > 75%)  Function - Chair/bed transfer Chair/bed transfer method: Ambulatory Chair/bed transfer assist level: Supervision or verbal cues Chair/bed transfer assistive device: Armrests, Walker Chair/bed transfer details: Tactile cues for posture, Visual cues/gestures for precautions/safety, Visual cues/gestures for sequencing, Verbal cues for precautions/safety  Function - Locomotion: Wheelchair Will patient use wheelchair at discharge?: No Type: Manual Max wheelchair distance: 110 ft Assist Level: Supervision or verbal cues Assist Level: Supervision or verbal cues Function - Locomotion: Ambulation Assistive device: Walker-rolling Max distance: 200 ft Assist level: Supervision or verbal cues Assist level: Supervision or verbal cues Assist level: Supervision or verbal cues Assist level: Supervision or verbal cues Assist level: Touching or steadying assistance (Pt  > 75%)  Function - Comprehension Comprehension: Auditory Comprehension assist level: Understands complex 90% of the time/cues 10% of the time  Function - Expression Expression: Verbal Expression assist level: Expresses complex 90% of the time/cues < 10% of the time  Function - Social Interaction Social Interaction assist level: Interacts appropriately with others - No medications needed.  Function - Problem Solving Problem solving assist level: Solves complex problems: Recognizes & self-corrects  Function - Memory Memory assist level: Complete Independence: No helper Patient normally able to recall (first 3 days only): Current season, Location of own room, Staff names and faces, That he or she is in a hospital  Medical Problem List and Plan: 1. Left hemiparesis and functional deficitssecondary to right basal ganglia infarct -CIR PT, OT ,SLP 2. DVT Prophylaxis/Anticoagulation: Pharmaceutical: Lovenox no sign of DVT, plt 235K 3. Pain Management: tylenol prn for OA pain. No current discomfort 4. Mood: LCSW to follow for evaluation and support.  5. Neuropsych: This patient iscapable of making decisions on herown behalf. 6. Skin/Wound Care: Routine pressure relief measures.maculopapular eruption nasal-antibacterial soap  and corticosporin 7. Fluids/Electrolytes/Nutrition: Monitor I/O.3/28 lytes nl Maintain adequate nutrition and hydration status.  8.   Dyslipidemia:Now on zocor 40 mg daily.  9. GB disease/GERD: Managed by diet.  10.  Dysphagia - cont speech LOS (Days) 3 A FACE TO FACE EVALUATION WAS PERFORMED  , E 07/24/2016, 7:54 AM   

## 2016-07-24 NOTE — IPOC Note (Signed)
Overall Plan of Care [facility]) Patient Details Name: Catherine [name] MRN: [id number] DOB: 1945-10-01  Admitting Diagnosis: Stroke [facility] Problems: Active Problems:   HLD (hyperlipidemia)   Basal ganglia infarction ([place])   GERD (gastroesophageal reflux disease)     Functional Problem List: Nursing Bladder, Endurance, Medication Management, Motor, Perception, Safety  PT Balance, Endurance, Motor, Safety, Sensory  OT Balance, Cognition, Endurance, Pain, Safety, Motor  SLP Linguistic  TR         Basic ADL's: OT Eating, Grooming, Bathing, Dressing, Toileting     Advanced  ADL's: OT Simple Meal Preparation, Laundry     Transfers: PT Bed Mobility, Bed to Chair, Car, [employer], Futures trader, Metallurgist: PT Ambulation, Stairs, Emergency planning/management officer     Additional Impairments: OT Fuctional Use of Upper Extremity  SLP        TR      Anticipated Outcomes Item Anticipated Outcome  Self Feeding mod I   Swallowing      Basic self-care  mod I   Toileting  mod I    Bathroom Transfers mod I - toilet, supervision - shower  Bowel/Bladder  continent bowel and bladder mod I   Transfers  modified independent  Locomotion  modified independent  Communication  Mod I  Cognition     Pain  n/a  Safety/Judgment  mod I   Therapy Plan: PT Intensity: Minimum of 1-2 x/day ,45 to 90 minutes PT Frequency: 5 out of 7 days PT Duration Estimated Length of Stay: 10-14 days OT Intensity: Minimum of 1-2 x/day, 45 to 90 minutes OT Frequency: 5 out of 7 days OT Duration/Estimated Length of Stay: 10days SLP Intensity: Minumum of 1-2 x/day, 30 to 90 minutes SLP Frequency: 3 to 5 out of 7 days SLP Duration/Estimated Length of Stay: 7 to 10 days       Team Interventions: Nursing Interventions Patient/Family Education, Bladder Management, Disease Management/Prevention, Medication Management, Dysphagia/Aspiration Precaution Training, Discharge Planning  PT  interventions Ambulation/gait training, Training and development officer, Discharge planning, Community reintegration, Disease management/prevention, Engineer, drilling, Functional mobility training, Neuromuscular re-education, Patient/family education, Psychosocial support, Stair training, Therapeutic Activities, Therapeutic Exercise, UE/LE Strength taining/ROM, UE/LE Coordination activities, Wheelchair propulsion/positioning  OT Interventions Training and development officer, Discharge planning, Community reintegration, Engineer, drilling, Functional mobility training, Psychosocial support, Therapeutic Activities, UE/LE Coordination activities, Patient/family education, UE/LE Strength taining/ROM, Neuromuscular re-education, Self Care/advanced ADL retraining, Therapeutic Exercise  SLP Interventions Speech/Language facilitation, Therapeutic Activities, Therapeutic Exercise, Patient/family education  TR Interventions    SW/CM Interventions Discharge Planning, Psychosocial Support, Patient/Family Education    Team Discharge Planning: Destination: PT-Home ,OT- Home , SLP-Home Projected Follow-up: PT-Home health PT, OT-  Outpatient OT, SLP-None Projected Equipment Needs: PT-To be determined, OT- To be determined, SLP-None recommended by SLP Equipment Details: PT-Pt reports having cane and rw at home. , OT-  Patient/family involved in discharge planning: PT- Patient,  OT-Patient, SLP-Patient  MD ELOS: 8-13d Medical Rehab Prognosis:  Good Assessment:  71 y.o.RHfemalewith history of TIA 2014, OA , GERD who was admitted on 07/19/2016 with unsteady gait, left-sided weakness and speech difficulty. MRI brain done revealing small right basal ganglia infarct with remote left thalamic lacunar infract. EEG without evidence of seizure. 2 D echo done today. CTA head/neck revealed short segment severe left A2 segment. Dr. Erlinda Hong recommends changing ASA to plavix for stroke secondary to small  vessel disease and and increasing statin dose   Now requiring 24/7 Rehab RN,MD, as well as CIR level  PT, OT and SLP.  Treatment team will focus on ADLs and mobility with goals set at Mod I    See Team Conference Notes for weekly updates to the plan of care

## 2016-07-24 NOTE — Progress Notes (Signed)
Physical Therapy Session Note  Patient Details  Name: Catherine [name] MRN: [id number] Date of Birth: [birth year]  Today's Date: 07/24/2016 PT Individual Time: 0915-1030 PT Individual Time Calculation (min): 75 min   Short Term Goals: Week 1:  PT Short Term Goal 1 (Week 1): Pt to mod. independent with bed mobility.  PT Short Term Goal 2 (Week 1): Pt to ambulate 150 ft with supervision and least restrictive assistive device.  PT Short Term Goal 3 (Week 1): Pt to perform basic transfers with supervision.   Skilled Therapeutic Interventions/Progress Updates:   Patient sitting in wheelchair upon arrival, reporting feeling "sluggish" and c/o headache this date but agreeable to therapy. Patient also reporting discomfort and heaviness in L shoulder. Provided patient with 1/2 lap tray for LUE support in wheelchair. Sit <> stand using RW with verbal cues for safe hand placement and focus on equal WB BLE, supervision. Gait training using RW 2 x 250 ft with supervision. Assessed leg length in supine due to uneven weight shifting during gait with LLE noted to be longer than RLE. Patient reports daughter plans to bring tennis shoes from home and will assess effectiveness of heel lift in shoe. LLE NMR via supine bridging with isometric hip adduction hold x 20 and standing step taps to 4" step using RLE only with min A overall for 3 trials of 8 taps. Gait training using RW kicking yoga block with LLE to facilitate improved L foot clearance during gait and normalizing stride length. Performed NuStep using BUE/BLE at level 4 x 12 min for L NMR and increased L sided attention. Patient required seated rest breaks throughout session due to fatigue and not feeling well. See vitals assessed below. Patient ambulated back to room and left sitting in wheelchair with needs in reach and visitor present.     Therapy Documentation Precautions:  Precautions Precautions: Fall Restrictions Weight Bearing Restrictions: No Vital  Signs: Therapy Vitals Pulse Rate: 82 BP: (!) 131/59 Patient Position (if appropriate): Sitting Oxygen Therapy SpO2: 97 % O2 Device: Not Delivered Pain: Pain Assessment Pain Assessment: No/denies pain   See Function Navigator for Current Functional Status.   Therapy/Group: Individual Therapy  Kari Montero, Murray Hodgkins 07/24/2016, 11:07 AM

## 2016-07-24 NOTE — Progress Notes (Signed)
Speech Language Pathology Daily Session Note  Patient Details  Name: Catherine [name] MRN: [id number] Date of Birth: [birth year]  Today's Date: 07/24/2016 SLP Individual Time: 0730-0830 SLP Individual Time Calculation (min): 60 min  Short Term Goals: Week 1: SLP Short Term Goal 1 (Week 1): Pt will utilize speech intelligibility strategies with Min A to increase speech intelligilbity to >90% at the connected sentence level during unstructured task.  SLP Short Term Goal 2 (Week 1): Pt will utilize increased vocal intensity with Min A to to achieve > 90% intelligibility in moderately noisey environment.  SLP Short Term Goal 3 (Week 1): Pt will effectively describe unknown information in barrier tasks with >90% intelligibility and Min A verbal cues.   Skilled Therapeutic Interventions: Skilled treatment session focused on speech communication goals. SLP facilitated session by previewing handout of intelligibility strategies. Pt able to implement increased vocal intensity in mildly noisy environment with supervision cues. Pt able to order meals for day with complete intelligibility and describe several medical conditions to MD with complete intelligibiliity. Pt with increased ROM on left face. Pt with great progress over previous session. Pt left upright in wheelchair with all needs within reach. Continue per current plan of care.      Function:  Cognition Comprehension Comprehension assist level: Understands complex 90% of the time/cues 10% of the time  Expression   Expression assist level: Expresses complex 90% of the time/cues < 10% of the time  Social Interaction Social Interaction assist level: Interacts appropriately with others - No medications needed.  Problem Solving Problem solving assist level: Solves complex problems: Recognizes & self-corrects  Memory Memory assist level: Complete Independence: No helper    Pain Pain Assessment Pain Assessment: No/denies pain  Therapy/Group:  Individual Therapy   Catherine [name] Catherine [name], M.S., CCC-SLP Speech-Language Pathologist   Catherine [name] 07/24/2016, 12:13 PM

## 2016-07-25 ENCOUNTER — Inpatient Hospital Stay (HOSPITAL_COMMUNITY): Payer: Medicare Other

## 2016-07-25 DIAGNOSIS — G8191 Hemiplegia, unspecified affecting right dominant side: Secondary | ICD-10-CM

## 2016-07-25 DIAGNOSIS — K219 Gastro-esophageal reflux disease without esophagitis: Secondary | ICD-10-CM

## 2016-07-25 LAB — GLUCOSE, CAPILLARY: Glucose-Capillary: 91 mg/dL (ref 65–99)

## 2016-07-25 NOTE — Progress Notes (Signed)
Subjective/Complaints: Rash under nose itching but clearing  ROS  Neg CP, SOB, N/V/D  Objective: Vital Signs: Blood pressure (!) 125/54, pulse (!) 58, temperature 98.5 F (36.9 C), temperature source Oral, resp. rate 17, weight 87.6 kg (193 lb 2 oz), SpO2 97 %. No results found. No results found for this or any previous visit (from the past 72 hour(s)).   HEENT: normal Cardio: RRR and no murmur Resp: CTA B/L and unlabored GI: BS positive and NT, ND Extremity:  Pulses positive and No Edema Skin:   Maculopapular skin eruption, inferior to nasal vestibule, pink rather than red today Neuro: Alert/Oriented, Abnormal Sensory mildly diminished LT on Left , Abnormal Motor 5/5 in RUE and RLE, 3/5 LUE and LLE, Abnormal FMC Ataxic/ dec FMC and Dysarthric Musc/Skel:  Other no pain with AROM of BUE and BLEs Gen NAD   Assessment/Plan: 1. Functional deficits secondary to Right basal ganglia infarct which require 3+ hours per day of interdisciplinary therapy in a comprehensive inpatient rehab setting. Physiatrist is providing close team supervision and 24 hour management of active medical problems listed below. Physiatrist and rehab team continue to assess barriers to discharge/monitor patient progress toward functional and medical goals. FIM: Function - Bathing Bathing activity did not occur: Refused Position: Shower Body parts bathed by patient: Right arm, Left arm, Abdomen, Chest, Front perineal area, Buttocks, Right upper leg, Left upper leg, Back Body parts bathed by helper: Right lower leg, Left lower leg Assist Level: Touching or steadying assistance(Pt > 75%)  Function- Upper Body Dressing/Undressing Upper body dressing/undressing activity did not occur: Refused What is the patient wearing?: Bra, Pull over shirt/dress Bra - Perfomed by patient: Thread/unthread right bra strap, Thread/unthread left bra strap Bra - Perfomed by helper: Hook/unhook bra (pull down sports bra) Pull over  shirt/dress - Perfomed by patient: Thread/unthread right sleeve, Thread/unthread left sleeve, Pull shirt over trunk, Put head through opening Function - Lower Body Dressing/Undressing What is the patient wearing?: Underwear, Pants, Socks Position: Wheelchair/chair at sink Underwear - Performed by patient: Thread/unthread right underwear leg Underwear - Performed by helper: Pull underwear up/down Pants- Performed by patient: Thread/unthread right pants leg Pants- Performed by helper: Thread/unthread left pants leg, Pull pants up/down Non-skid slipper socks- Performed by patient: Don/doff right sock Non-skid slipper socks- Performed by helper: Don/doff right sock, Don/doff left sock Socks - Performed by helper: Don/doff right sock, Don/doff left sock Assist for footwear: Partial/moderate assist  Function - Toileting Toileting steps completed by patient: Adjust clothing prior to toileting, Performs perineal hygiene, Adjust clothing after toileting Toileting steps completed by helper: Adjust clothing after toileting Toileting Assistive Devices: Grab bar or rail Assist level: Touching or steadying assistance (Pt.75%)  Function - Air cabin crew transfer assistive device: Grab bar Assist level to toilet: Touching or steadying assistance (Pt > 75%) Assist level from toilet: Touching or steadying assistance (Pt > 75%)  Function - Chair/bed transfer Chair/bed transfer method: Ambulatory Chair/bed transfer assist level: Supervision or verbal cues Chair/bed transfer assistive device: Armrests, Walker Chair/bed transfer details: Tactile cues for posture, Visual cues/gestures for precautions/safety, Visual cues/gestures for sequencing, Verbal cues for precautions/safety  Function - Locomotion: Wheelchair Will patient use wheelchair at discharge?: No Type: Manual Max wheelchair distance: 110 ft Assist Level: Supervision or verbal cues Assist Level: Supervision or verbal cues Function -  Locomotion: Ambulation Assistive device: Walker-rolling Max distance: 250 ft Assist level: Supervision or verbal cues Assist level: Supervision or verbal cues Assist level: Supervision or verbal cues Walk 150 feet  activity did not occur: Safety/medical concerns Assist level: Supervision or verbal cues Assist level: Touching or steadying assistance (Pt > 75%)  Function - Comprehension Comprehension: Auditory Comprehension assist level: Understands complex 90% of the time/cues 10% of the time  Function - Expression Expression: Verbal Expression assist level: Expresses complex 90% of the time/cues < 10% of the time  Function - Social Interaction Social Interaction assist level: Interacts appropriately with others - No medications needed.  Function - Problem Solving Problem solving assist level: Solves complex problems: Recognizes & self-corrects  Function - Memory Memory assist level: Complete Independence: No helper Patient normally able to recall (first 3 days only): Current season, Location of own room, Staff names and faces, That he or she is in a hospital  Medical Problem List and Plan: 1. Left hemiparesis and functional deficitssecondary to right basal ganglia infarct -CIR PT, OT ,SLP 2. DVT Prophylaxis/Anticoagulation: Pharmaceutical: Lovenox no sign of DVT, plt 235K 3. Pain Management: tylenol prn for OA pain.  4. Mood: LCSW to follow for evaluation and support.  5. Neuropsych: This patient iscapable of making decisions on herown behalf. 6. Skin/Wound Care: Routine pressure relief measures.maculopapular eruption nasal-antibacterial soap  and corticosporin-improving 7. Fluids/Electrolytes/Nutrition: Monitor I/O.3/28 lytes nl Maintain adequate nutrition and hydration status.  8. Dyslipidemia:Now on zocor 40 mg daily.  9. GB disease/GERD: Managed by diet.  10.  Dysphagia - cont speech intake is good LOS (Days) 4 A FACE TO FACE EVALUATION WAS  PERFORMED  Sheridan Hew E 07/25/2016, 8:09 AM

## 2016-07-25 NOTE — Progress Notes (Signed)
Occupational Therapy Session Note  Patient Details  Name: Catherine [name] MRN: [id number] Date of Birth: 1945/08/25  Today's Date: 07/25/2016 OT Individual Time: 0800-0900 OT Individual Time Calculation (min): 60 min   Short Term Goals: Week 1:  OT Short Term Goal 1 (Week 1): STGs=LTGS secondary to estimated LOS  Skilled Therapeutic Interventions/Progress Updates: Therapeutic activity with focus on improved gross LUE strength, functional mobility and transfers using RW, and FMC of left hand.   Pt received seated in w/c with bedside table after finishing her breakfast.   Pt refused shower or bathing/dressing due to preference to focus on LUE gross and [place].   Pt was challenged to ambulate to rehab gym with RW and completed task with close supervision, vc for gait (d/t dragging her left foot) but improved with intermittent vc to flex hip/knee during mobility.   Pt required contact guard to raised mat and completed 40 minutes of facilitated PRE to LUE with hand-over-hand guidance, reinforcing PNF d1/d2 flexion/extension and intermittent weight-bearing through LUE while reaching across midline.  Treatment  concluded with functional task of assembling pipe-tree goal-post design with left hand as dominant hand during task.   OT demo'd use of coban wrap on straight pipe section to reinforce adaptive method to enhance grip.  Pt was escorted back to her room in her w/c at end of session with setup at for grooming and call light within reach at w/c level.    Therapy Documentation Precautions:  Precautions Precautions: Fall Restrictions Weight Bearing Restrictions: No   Pain: Pain Assessment Pain Assessment: No/denies pain   ADL:     Exercises:     Other Treatments:    See Function Navigator for Current Functional Status.   Therapy/Group: Individual Therapy  Deniyah Dillavou 07/25/2016, 11:11 AM

## 2016-07-26 MED ORDER — HYDROCORTISONE 0.5 % EX CREA
TOPICAL_CREAM | Freq: Two times a day (BID) | CUTANEOUS | Status: DC
  Administered 2016-07-26 – 2016-07-27 (×3): via TOPICAL
  Administered 2016-07-27: 1 via TOPICAL
  Administered 2016-07-28 – 2016-07-29 (×3): via TOPICAL
  Filled 2016-07-26: qty 28.35

## 2016-07-26 NOTE — Progress Notes (Signed)
Subjective/Complaints: Rash under nose itching  ROS  Neg CP, SOB, N/V/D  Objective: Vital Signs: Blood pressure (!) 138/51, pulse 60, temperature 98 F (36.7 C), temperature source Oral, resp. rate 18, weight 87.6 kg (193 lb 2 oz), SpO2 96 %. No results found. Results for orders placed or performed during the hospital encounter of 07/21/16 (from the past 72 hour(s))  Glucose, capillary     Status: None   Collection Time: 07/25/16 11:47 AM  Result Value Ref Range   Glucose-Capillary 91 65 - 99 mg/dL   Comment 1 Notify RN      HEENT: normal Cardio: RRR and no murmur Resp: CTA B/L and unlabored GI: BS positive and NT, ND Extremity:  Pulses positive and No Edema Skin:   Maculopapular skin eruption, inferior to nasal vestibule, pink rather than red today Neuro: Alert/Oriented, Abnormal Sensory mildly diminished LT on Left , Abnormal Motor 5/5 in RUE and RLE, 3/5 LUE and LLE, Abnormal FMC Ataxic/ dec FMC and Dysarthric Musc/Skel:  Other no pain with AROM of BUE and BLEs Gen NAD   Assessment/Plan: 1. Functional deficits secondary to Right basal ganglia infarct which require 3+ hours per day of interdisciplinary therapy in a comprehensive inpatient rehab setting. Physiatrist is providing close team supervision and 24 hour management of active medical problems listed below. Physiatrist and rehab team continue to assess barriers to discharge/monitor patient progress toward functional and medical goals. FIM: Function - Bathing Bathing activity did not occur: Refused Position: Shower Body parts bathed by patient: Right arm, Left arm, Abdomen, Chest, Front perineal area, Buttocks, Right upper leg, Left upper leg, Back Body parts bathed by helper: Right lower leg, Left lower leg Assist Level: Touching or steadying assistance(Pt > 75%)  Function- Upper Body Dressing/Undressing Upper body dressing/undressing activity did not occur: Refused What is the patient wearing?: Bra, Pull over  shirt/dress Bra - Perfomed by patient: Thread/unthread right bra strap, Thread/unthread left bra strap Bra - Perfomed by helper: Hook/unhook bra (pull down sports bra) Pull over shirt/dress - Perfomed by patient: Thread/unthread right sleeve, Thread/unthread left sleeve, Pull shirt over trunk, Put head through opening Function - Lower Body Dressing/Undressing What is the patient wearing?: Underwear, Pants, Socks Position: Wheelchair/chair at sink Underwear - Performed by patient: Thread/unthread right underwear leg Underwear - Performed by helper: Pull underwear up/down Pants- Performed by patient: Thread/unthread right pants leg Pants- Performed by helper: Thread/unthread left pants leg, Pull pants up/down Non-skid slipper socks- Performed by patient: Don/doff right sock Non-skid slipper socks- Performed by helper: Don/doff right sock, Don/doff left sock Socks - Performed by helper: Don/doff right sock, Don/doff left sock Assist for footwear: Partial/moderate assist  Function - Toileting Toileting steps completed by patient: Adjust clothing prior to toileting, Performs perineal hygiene, Adjust clothing after toileting Toileting steps completed by helper: Adjust clothing after toileting Toileting Assistive Devices: Grab bar or rail Assist level: Touching or steadying assistance (Pt.75%)  Function - Air cabin crew transfer assistive device: Grab bar, Walker Assist level to toilet: Touching or steadying assistance (Pt > 75%) Assist level from toilet: Touching or steadying assistance (Pt > 75%)  Function - Chair/bed transfer Chair/bed transfer method: Ambulatory Chair/bed transfer assist level: Supervision or verbal cues Chair/bed transfer assistive device: Armrests, Walker Chair/bed transfer details: Tactile cues for posture, Visual cues/gestures for precautions/safety, Visual cues/gestures for sequencing, Verbal cues for precautions/safety  Function - Locomotion:  Wheelchair Will patient use wheelchair at discharge?: No Type: Manual Max wheelchair distance: 110 ft Assist Level: Supervision or verbal  cues Assist Level: Supervision or verbal cues Function - Locomotion: Ambulation Assistive device: Walker-rolling Max distance: 250 ft Assist level: Supervision or verbal cues Assist level: Supervision or verbal cues Assist level: Supervision or verbal cues Walk 150 feet activity did not occur: Safety/medical concerns Assist level: Supervision or verbal cues Assist level: Touching or steadying assistance (Pt > 75%)  Function - Comprehension Comprehension: Auditory Comprehension assist level: Understands complex 90% of the time/cues 10% of the time  Function - Expression Expression: Verbal Expression assist level: Expresses complex 90% of the time/cues < 10% of the time  Function - Social Interaction Social Interaction assist level: Interacts appropriately with others - No medications needed.  Function - Problem Solving Problem solving assist level: Solves complex problems: Recognizes & self-corrects  Function - Memory Memory assist level: Complete Independence: No helper Patient normally able to recall (first 3 days only): Current season, Location of own room, Staff names and faces, That he or she is in a hospital  Medical Problem List and Plan: 1. Left hemiparesis and functional deficitssecondary to right basal ganglia infarct -CIR PT, OT ,SLP 2. DVT Prophylaxis/Anticoagulation: Pharmaceutical: Lovenox no sign of DVT, plt 235K 3. Pain Management: tylenol prn for OA pain.  4. Mood: LCSW to follow for evaluation and support.  5. Neuropsych: This patient iscapable of making decisions on herown behalf. 6. Skin/Wound Care: Routine pressure relief measures.maculopapular eruption nasal-antibacterial soap  and neomycin cream-improving, add hydrocortisone for itching 7. Fluids/Electrolytes/Nutrition: Monitor I/O.3/28 lytes nl  Maintain adequate nutrition and hydration status.  8. Dyslipidemia:Now on zocor 40 mg daily.  9. GB disease/GERD: Managed by diet.  10.  Dysphagia - cont speech intake is good LOS (Days) 5 A FACE TO FACE EVALUATION WAS PERFORMED  KIRSTEINS,ANDREW E 07/26/2016, 7:56 AM

## 2016-07-27 ENCOUNTER — Inpatient Hospital Stay (HOSPITAL_COMMUNITY): Payer: Medicare Other | Admitting: Speech Pathology

## 2016-07-27 ENCOUNTER — Inpatient Hospital Stay (HOSPITAL_COMMUNITY): Payer: Medicare Other | Admitting: Physical Therapy

## 2016-07-27 ENCOUNTER — Inpatient Hospital Stay (HOSPITAL_COMMUNITY): Payer: Medicare Other | Admitting: Occupational Therapy

## 2016-07-27 MED ORDER — SALINE SPRAY 0.65 % NA SOLN
1.0000 | Freq: Two times a day (BID) | NASAL | Status: DC
  Administered 2016-07-27 – 2016-07-29 (×4): 1 via NASAL
  Filled 2016-07-27: qty 44

## 2016-07-27 NOTE — Progress Notes (Signed)
Physical Therapy Session Note  Patient Details  Name: Catherine [name] MRN: [id number] Date of Birth: [birth year]  Today's Date: 07/27/2016 PT Individual Time: 1450-1603 PT Individual Time Calculation (min): 73 min   Short Term Goals: Week 1:  PT Short Term Goal 1 (Week 1): Pt to mod. independent with bed mobility.  PT Short Term Goal 2 (Week 1): Pt to ambulate 150 ft with supervision and least restrictive assistive device.  PT Short Term Goal 3 (Week 1): Pt to perform basic transfers with supervision.   Skilled Therapeutic Interventions/Progress Updates:  Pt received in w/c & agreeable to tx. Pt's daughter Sharyn Lull) present for first half of session. Session focused on gait training, LUE/LLE NMR, strengthening, and pt education. Pt ambulated room<>gym and within gym with RW & supervision with improving heel strike LLE with occasional cuing for increased awareness of gait pattern.  Pt donned & doffed pillow cases with task focusing on LUE NMR. On mat table pt performed BLE bridging with adductor hold and single leg bridging with multimodal cuing for technique. Pt with decreased stability when performing single leg bridging. Pt performed sit<>stand transfers with RLE elevated on step with task focusing on LLE NMR and strengthening. Pt able to complete multiple transfers with steady assist with slowly improving eccentric control as task progressed. Pt performed taps on 6" step without BUE support. Pt required min assist for balance during activity. Educated pt on stroke diagnosis as well as risks for another stroke and lifestyle modifications (diet, exercise) to decrease risk.  At end of session pt left sitting in w/c in room with all needs within reach.   Pt limited by nose bleed during session.   Therapy Documentation Precautions:  Precautions Precautions: Fall Restrictions Weight Bearing Restrictions: No  Pain: Noted headache in R front - 3/10 - RN made aware.  See Function Navigator for  Current Functional Status.   Therapy/Group: Individual Therapy  Waunita Schooner 07/27/2016, 4:59 PM

## 2016-07-27 NOTE — Progress Notes (Signed)
Speech Language Pathology Daily Session Note  Patient Details  Name: Catherine [name] MRN: [id number] Date of Birth: 1945-06-27  Today's Date: 07/27/2016 SLP Individual Time: 1100-1200 SLP Individual Time Calculation (min): 60 min  Short Term Goals: Week 1: SLP Short Term Goal 1 (Week 1): Pt will utilize speech intelligibility strategies with Min A to increase speech intelligilbity to >90% at the connected sentence level during unstructured task.  SLP Short Term Goal 2 (Week 1): Pt will utilize increased vocal intensity with Min A to to achieve > 90% intelligibility in moderately noisey environment.  SLP Short Term Goal 3 (Week 1): Pt will effectively describe unknown information in barrier tasks with >90% intelligibility and Min A verbal cues.   Skilled Therapeutic Interventions: Skilled treatment session focused on speech intelligibility goals. SLP facilitated session by taking pt outside for increase in noise level. Pt used speech intelligibility strategies with Mod I for >95% intelligibility at the simple conversation level in moderately noisy environment with SLP sitting next to pt not in front of her. Facial ROM has also increased. Pt was returned to room, left upright in wheelchair with all needs within reach. Continue per current plan of care.      Function:    Cognition Comprehension Comprehension assist level: Understands complex 90% of the time/cues 10% of the time  Expression   Expression assist level: Expresses complex 90% of the time/cues < 10% of the time  Social Interaction Social Interaction assist level: Interacts appropriately with others - No medications needed.  Problem Solving Problem solving assist level: Solves complex problems: Recognizes & self-corrects  Memory Memory assist level: Complete Independence: No helper    Pain    Therapy/Group: Individual Therapy   Ethel Meisenheimer B. Rutherford Nail, M.S., CCC-SLP Speech-Language Pathologist   Catherine [name] 07/27/2016, 12:03  PM

## 2016-07-27 NOTE — Progress Notes (Signed)
Continues to complain of rash under nose. Under nose and face red. Catherine [name] A

## 2016-07-27 NOTE — Progress Notes (Signed)
Occupational Therapy Session Note  Patient Details  Name: Catherine [name] MRN: [id number] Date of Birth: 1945/08/25  Today's Date: 07/24/2016 OT Individual Time: 1405-1501 OT Individual Time Calculation (min): 56 min   Short Term Goals: Week 1:  OT Short Term Goal 1 (Week 1): STG=LTG secondary to ELOS  Skilled Therapeutic Interventions/Progress Updates:    OT treatment session focused on L NMR, bed positioning for L hemiplegia, L fine-motor coordination, and L shoulder pain magaement. Provided pt with handout for hemi positioning in bed. Pt practiced positions rolling L<>R with supervision and cues for L UE positioning. Discussed hemi positioning strategies during functional ambulation, transfers, and ADL tasks.  L NMR focused on shoulder flex/ext, elbow flex/ext, forearm pronation supination, wrist flex/ext, and thumb abduction. Pt reports L shoulder pain/heaviness-OT applied K-tape for pain management, will re-assess pain progression. Discussed home bathroom set-up and collaborated with pt on modifications for safe shower and toilet access. Pt left in side-lying on R side at end of session with L UE positioned on pillows, needs met, and call bell in reach.    Therapy Documentation Precautions:  Precautions Precautions: Fall Precaution Comments: hx of seizures Restrictions Weight Bearing Restrictions: No Pain: Pain Assessment Pain Assessment: 0-10 Pain Score: 3  Pain Type: Acute pain Pain Location: Shoulder Pain Orientation: Left Pain Descriptors / Indicators: Sore Pain Onset: With Activity Pain Intervention(s): Repositioned ADL: ADL ADL Comments: see functional navigator  See Function Navigator for Current Functional Status.   Therapy/Group: Individual Therapy  Daneen Schick Cataldo Cosgriff

## 2016-07-27 NOTE — Progress Notes (Signed)
Occupational Therapy Session Note  Patient Details  Name: Catherine [name] MRN: [id number] Date of Birth: 13-Feb-1946  Today's Date: 07/27/2016 OT Individual Time: 0800-0900 OT Individual Time Calculation (min): 60 min    Short Term Goals: Week 1:  OT Short Term Goal 1 (Week 1): STGs=LTGS secondary to estimated LOS  Skilled Therapeutic Interventions/Progress Updates:    OT treatment session focused on modified bathing/dressing, functional use of L UE, and standing balance. Stand-step-turn transfer wc<>walk-in shower with close supervision using grab bar. Set-up A for bathing and supervision for standing bathing tasks. Incorporated forced use of L UE, with focus on normal movement patterns and coordination tasks to wash and dry R side of body. Dressing sit<>stand at the sink with education provided for modified hemi-techniques to don shirt and pants. Addressed L hand coordination with shoe tying activity with improved lateral pinch. Pt then stood for toothbrusing task with close supervision to maintain balance. Pt left seated in wc at end of session with needs met and call bell in reach.  Therapy Documentation Precautions:  Precautions Precautions: Fall Restrictions Weight Bearing Restrictions: No Pain: Pain Assessment Pain Assessment: No/denies pain  See Function Navigator for Current Functional Status.   Therapy/Group: Individual Therapy  Valma Cava 07/27/2016, 2:58 PM

## 2016-07-28 ENCOUNTER — Inpatient Hospital Stay (HOSPITAL_COMMUNITY): Payer: Medicare Other | Admitting: Occupational Therapy

## 2016-07-28 ENCOUNTER — Inpatient Hospital Stay (HOSPITAL_COMMUNITY): Payer: Medicare Other | Admitting: Physical Therapy

## 2016-07-28 ENCOUNTER — Inpatient Hospital Stay (HOSPITAL_COMMUNITY): Payer: Medicare Other | Admitting: Speech Pathology

## 2016-07-28 LAB — CBC
HEMATOCRIT: 39.2 % (ref 36.0–46.0)
Hemoglobin: 13 g/dL (ref 12.0–15.0)
MCH: 29.3 pg (ref 26.0–34.0)
MCHC: 33.2 g/dL (ref 30.0–36.0)
MCV: 88.5 fL (ref 78.0–100.0)
Platelets: 235 10*3/uL (ref 150–400)
RBC: 4.43 MIL/uL (ref 3.87–5.11)
RDW: 13.8 % (ref 11.5–15.5)
WBC: 5.6 10*3/uL (ref 4.0–10.5)

## 2016-07-28 NOTE — Progress Notes (Signed)
Social Work [name], female   DOB: 05-10-1945, 71 y.o.   MRN: [id number]  Met with team who feel pt will reach her goals by tomorroww and be ready for discharge then. MD in agreement with this plan. Met with pt and left a message for daughter-Michelle to inform. Pt is very pleased due to she is not sleeping here and feels ready to go home. Work on any discharge needs.

## 2016-07-28 NOTE — Progress Notes (Signed)
Subjective/Complaints: Rash under nares less itchy, asking about d/c to home C/o poor sleep , cannot get comfortable in bed no nocturia or pains ROS  Neg CP, SOB, N/V/D  Objective: Vital Signs: Blood pressure (!) 117/45, pulse 63, temperature 97.8 F (36.6 C), temperature source Oral, resp. rate 18, weight 87.6 kg (193 lb 2 oz), SpO2 98 %. No results found. Results for orders placed or performed during the hospital encounter of 07/21/16 (from the past 72 hour(s))  Glucose, capillary     Status: None   Collection Time: 07/25/16 11:47 AM  Result Value Ref Range   Glucose-Capillary 91 65 - 99 mg/dL   Comment 1 Notify RN   CBC     Status: None   Collection Time: 07/28/16  5:00 AM  Result Value Ref Range   WBC 5.6 4.0 - 10.5 K/uL   RBC 4.43 3.87 - 5.11 MIL/uL   Hemoglobin 13.0 12.0 - 15.0 g/dL   HCT 39.2 36.0 - 46.0 %   MCV 88.5 78.0 - 100.0 fL   MCH 29.3 26.0 - 34.0 pg   MCHC 33.2 30.0 - 36.0 g/dL   RDW 13.8 11.5 - 15.5 %   Platelets 235 150 - 400 K/uL     HEENT: normal Cardio: RRR and no murmur Resp: CTA B/L and unlabored GI: BS positive and NT, ND Extremity:  Pulses positive and No Edema Skin:   Maculopapular skin eruption, inferior to nasal vestibule, pink rather than red today Neuro: Alert/Oriented, Abnormal Sensory mildly diminished LT on Left , Abnormal Motor 5/5 in RUE and RLE, 3/5 LUE and LLE, Abnormal FMC Ataxic/ dec FMC and Dysarthric Musc/Skel:  Other no pain with AROM of BUE and BLEs Gen NAD   Assessment/Plan: 1. Functional deficits secondary to Right basal ganglia infarct which require 3+ hours per day of interdisciplinary therapy in a comprehensive inpatient rehab setting. Physiatrist is providing close team supervision and 24 hour management of active medical problems listed below. Physiatrist and rehab team continue to assess barriers to discharge/monitor patient progress toward functional and medical goals. FIM: Function - Bathing Bathing activity did not  occur: Refused Position: Shower Body parts bathed by patient: Right arm, Left arm, Chest, Abdomen, Front perineal area, Buttocks, Right upper leg, Left upper leg, Right lower leg, Left lower leg Body parts bathed by helper: Right lower leg, Left lower leg Assist Level: Supervision or verbal cues  Function- Upper Body Dressing/Undressing Upper body dressing/undressing activity did not occur: Refused What is the patient wearing?: Bra, Pull over shirt/dress Bra - Perfomed by patient: Thread/unthread right bra strap, Thread/unthread left bra strap Bra - Perfomed by helper: Hook/unhook bra (pull down sports bra) Pull over shirt/dress - Perfomed by patient: Thread/unthread right sleeve, Thread/unthread left sleeve, Put head through opening, Pull shirt over trunk Assist Level: Supervision or verbal cues, Set up Set up : To obtain clothing/put away Function - Lower Body Dressing/Undressing What is the patient wearing?: Underwear, Pants, Shoes, Socks Position: Wheelchair/chair at sink Underwear - Performed by patient: Thread/unthread right underwear leg, Thread/unthread left underwear leg, Pull underwear up/down Underwear - Performed by helper: Pull underwear up/down Pants- Performed by patient: Pull pants up/down, Thread/unthread right pants leg, Thread/unthread left pants leg Pants- Performed by helper: Thread/unthread left pants leg, Pull pants up/down Non-skid slipper socks- Performed by patient: Don/doff right sock Non-skid slipper socks- Performed by helper: Don/doff right sock, Don/doff left sock Socks - Performed by patient: Don/doff right sock, Don/doff left sock Socks - Performed by helper: Don/doff  right sock, Don/doff left sock Shoes - Performed by patient: Don/doff left shoe, Don/doff right shoe Shoes - Performed by helper: Fasten right Assist for footwear: Supervision/touching assist Assist for lower body dressing: Touching or steadying assistance (Pt > 75%)  Function -  Toileting Toileting steps completed by patient: Adjust clothing prior to toileting, Performs perineal hygiene, Adjust clothing after toileting Toileting steps completed by helper: Adjust clothing after toileting Toileting Assistive Devices: Grab bar or rail Assist level: Set up/obtain supplies  Function - Air cabin crew transfer assistive device: Grab bar, Walker Assist level to toilet: Touching or steadying assistance (Pt > 75%) Assist level from toilet: Touching or steadying assistance (Pt > 75%)  Function - Chair/bed transfer Chair/bed transfer method: Ambulatory Chair/bed transfer assist level: Supervision or verbal cues Chair/bed transfer assistive device: Armrests, Walker Chair/bed transfer details: Tactile cues for posture, Visual cues/gestures for precautions/safety, Visual cues/gestures for sequencing, Verbal cues for precautions/safety  Function - Locomotion: Wheelchair Will patient use wheelchair at discharge?: No Type: Manual Max wheelchair distance: 110 ft Assist Level: Supervision or verbal cues Assist Level: Supervision or verbal cues Function - Locomotion: Ambulation Assistive device: Walker-rolling Max distance: 150 ft Assist level: Supervision or verbal cues Assist level: Supervision or verbal cues Assist level: Supervision or verbal cues Walk 150 feet activity did not occur: Safety/medical concerns Assist level: Supervision or verbal cues Assist level: Touching or steadying assistance (Pt > 75%)  Function - Comprehension Comprehension: Auditory Comprehension assist level: Understands complex 90% of the time/cues 10% of the time  Function - Expression Expression: Verbal Expression assist level: Expresses basic needs/ideas: With no assist  Function - Social Interaction Social Interaction assist level: Interacts appropriately 90% of the time - Needs monitoring or encouragement for participation or interaction.  Function - Problem Solving Problem  solving assist level: Solves complex problems: Recognizes & self-corrects  Function - Memory Memory assist level: Recognizes or recalls 90% of the time/requires cueing < 10% of the time Patient normally able to recall (first 3 days only): Current season, Location of own room, Staff names and faces, That he or she is in a hospital  Medical Problem List and Plan: 1. Left hemiparesis and functional deficitssecondary to right basal ganglia infarct -CIR PT, OT ,SLP team conf in am 2. DVT Prophylaxis/Anticoagulation: Pharmaceutical: Lovenox no sign of DVT, plt 235K 3. Pain Management: tylenol prn for OA pain.  4. Mood: LCSW to follow for evaluation and support.  5. Neuropsych: This patient iscapable of making decisions on herown behalf. 6. Skin/Wound Care: Routine pressure relief measures.maculopapular eruption nasal-antibacterial soap  and neomycin cream-improving, add hydrocortisone for itching 7. Fluids/Electrolytes/Nutrition: Monitor I/O.3/28 lytes nl Maintain adequate nutrition and hydration status.  8. Dyslipidemia:Now on zocor 40 mg daily. No myalgias 9. GB disease/GERD: Managed by diet.  10.  Dysphagia - cont speech tx, intake is good 11.  Insomnia doesn't like bed - prn trazodone LOS (Days) 7 A FACE TO FACE EVALUATION WAS PERFORMED  Efe Fazzino E 07/28/2016, 6:59 AM

## 2016-07-28 NOTE — Progress Notes (Signed)
Social Work [name], female   DOB: 08/31/1945, 70 y.o.   MRN: [id number]  Met with Jamie-daughter and pt to confirm discharge tomorrow. Both pleased with the plans. Pt's brother Is coming tomorrow to install grab bars and rails for her outside steps, prior to her going home. Referral made to [facility] for home health follow up, daughter works for them. No equipment needs. See tomorrow when here to answer any last minute questions or concerns.  

## 2016-07-28 NOTE — Patient Care Conference (Signed)
Inpatient RehabilitationTeam Conference and Plan of Care Update Date: 07/29/2016   Time: 9:00 AM    Patient Name: Catherine [name]      Medical Record Number: [id number]  Date of Birth: Aug 03, 1945 Sex: Female         Room/Bed: 4M08C/4M08C-02 Payor Info: Payor: MEDICARE / Plan: MEDICARE PART A AND B / Product Type: *No Product type* /    Admitting Diagnosis: Stroke SX  Admit Date/Time:  07/21/2016  4:34 PM Admission Comments: No comment available   Primary Diagnosis:  <principal problem not specified> Principal Problem: <principal problem not specified>  Patient Active Problem List   Diagnosis Date Noted  . Left hemiparesis ([place])   . GERD (gastroesophageal reflux disease) 07/23/2016  . Basal ganglia infarction ([place]) 07/21/2016  . Stroke (cerebrum) ([place]) 07/19/2016  . HLD (hyperlipidemia) 08/14/2014  . Obesity (BMI 30-39.9) 08/17/2013  . Hyperlipidemia LDL goal < 100 02/15/2013  . TIA (transient ischemic attack) 12/14/2012  . Essential hypertension 12/14/2012  . Other and unspecified hyperlipidemia 12/14/2012    Expected Discharge Date: Expected Discharge Date: 07/29/16  Team Members Present: Physician leading conference: Dr. Delice Lesch Social Worker Present: Ovidio Kin, LCSW Nurse Present: Heather Roberts, RN PT Present: Carney Living, PT OT Present: Napoleon Form, OT SLP Present: Stormy Fabian, SLP PPS Coordinator present : Daiva Nakayama, RN, CRRN     Current Status/Progress Goal Weekly Team Focus  Medical     adjusting BP and DM meds   medically stable     Bowel/Bladder   Continent of B&B; LBM: 07/29/2016  maintain current level of continence cont B & B monitor for changes in continence of bowel and bladder   Swallow/Nutrition/ Hydration             ADL's   Supervision- mod I level  Mod I , supervision shower transfers  L Neuro re-ed; family ed, d/c planning   Mobility   mod I-supervision  mod I-supervision  L NMR, L awareness, standing balance, activity tolerance, pt  education, discharge planning   Communication   Supervision cues at the simple conversation level  Mod I   use of speech intelligibility strategies to increase speech intelligiblity at the simple to complex conversation level   Safety/Cognition/ Behavioral Observations            Pain   Pt denies pain  Pain lesss than 3 less than 3 assess for pain Q shift and PRN   Skin   Maculopapular skin eruption, inferior to nasal vestibule  Skin free of further issues with skin integrity monitor no issues apply hibiclens to facial rash; neosporin and hydrocortisone to nares      *See Care Plan and progress notes for long and short-term goals.  Barriers to Discharge:   medical stability   Possible Resolutions to Barriers:    none   Discharge Planning/Teaching Needs:    Home with daughter's who will be providing 24 hr supervision level for a short time. Pt has done very well here and progressed quickly     Team Discussion:  Goals of mod/I level were reached yesterday and pt made mod/ I in her room. Medically stable for DC today. Diabetes and HTN managed and stable. Family has been in for education and prepared for DC. Follow up home health therapies.  Revisions to Treatment Plan:  DC today      Elease Hashimoto 07/29/2016, 8:45 AM

## 2016-07-28 NOTE — Progress Notes (Signed)
Physical Therapy Discharge Summary  Patient Details  Name: Catherine [name] MRN: [id number] Date of Birth: [birth year]  Today's Date: 07/28/2016 PT Individual Time: 0800-0930 PT Individual Time Calculation (min): 90 min   Patient has met 8 of 8 long term goals due to improved activity tolerance, improved balance, improved postural control, increased strength, increased range of motion, ability to compensate for deficits, functional use of  left upper extremity and left lower extremity and improved coordination.  Patient to discharge at an ambulatory level Modified Independent-supervision.   Patient's care partner is independent to provide the necessary physical assistance at discharge.  Reasons goals not met: NA  Recommendation:  Patient will benefit from ongoing skilled PT services in home health setting to continue to advance safe functional mobility, address ongoing impairments in L hemiplegia, L attention, standing balance, activity tolerance, and minimize fall risk.  Equipment: No equipment provided-patient owns RW  Reasons for discharge: treatment goals met and discharge from hospital  Patient/family agrees with progress made and goals achieved: Yes  PT Discharge Precautions/Restrictions Restrictions Weight Bearing Restrictions: No Pain Pain Assessment Pain Assessment: No/denies pain Cognition Overall Cognitive Status: Within Functional Limits for tasks assessed Arousal/Alertness: Awake/alert Orientation Level: Oriented X4 Attention: Selective Sustained Attention: Appears intact Selective Attention: Appears intact Memory: Appears intact Awareness: Appears intact Problem Solving: Appears intact Safety/Judgment: Appears intact Sensation Sensation Light Touch: Impaired Detail Light Touch Impaired Details: Impaired LUE;Impaired LLE Proprioception: Appears Intact Coordination Gross Motor Movements are Fluid and Coordinated: No Fine Motor Movements are Fluid and  Coordinated: No Heel Shin Test: impaired LLE Motor  Motor Motor: Hemiplegia  Mobility Bed Mobility Bed Mobility: Rolling Right;Rolling Left;Sit to Supine;Supine to Sit Rolling Right: 6: Modified independent (Device/Increase time) Rolling Left: 6: Modified independent (Device/Increase time) Supine to Sit: 6: Modified independent (Device/Increase time) Sit to Supine: 6: Modified independent (Device/Increase time) Transfers Transfers: Yes Sit to Stand: 6: Modified independent (Device/Increase time) Stand to Sit: 6: Modified independent (Device/Increase time) Locomotion  Ambulation Ambulation: Yes Ambulation/Gait Assistance: 6: Modified independent (Device/Increase time) Ambulation Distance (Feet): 200 Feet Assistive device: Rolling walker Gait Gait: Yes Gait Pattern: Impaired Gait Pattern: Decreased stance time - left;Step-through pattern;Decreased hip/knee flexion - left;Decreased weight shift to right;Poor foot clearance - left Gait velocity: decreased Stairs / Additional Locomotion Stairs: Yes Stairs Assistance: 5: Supervision Stair Management Technique: Two rails;Alternating pattern;Forwards Number of Stairs: 12 Height of Stairs: 3 (4 6", 8 3") Ramp: 6: Modified independent (Device) Curb: 5: Supervision (x3) Wheelchair Mobility Wheelchair Mobility: No  Trunk/Postural Assessment  Cervical Assessment Cervical Assessment: Within Functional Limits Thoracic Assessment Thoracic Assessment: Within Functional Limits Lumbar Assessment Lumbar Assessment: Within Functional Limits Postural Control Postural Control: Within Functional Limits  Balance Balance Balance Assessed: Yes Standardized Balance Assessment Standardized Balance Assessment: Berg Balance Test Berg Balance Test Sit to Stand: Able to stand without using hands and stabilize independently Standing Unsupported: Able to stand safely 2 minutes Sitting with Back Unsupported but Feet Supported on Floor or Stool:  Able to sit safely and securely 2 minutes Stand to Sit: Sits safely with minimal use of hands Transfers: Able to transfer safely, definite need of hands Standing Unsupported with Eyes Closed: Able to stand 10 seconds safely Standing Ubsupported with Feet Together: Able to place feet together independently and stand 1 minute safely From Standing, Reach Forward with Outstretched Arm: Can reach confidently >25 cm (10") From Standing Position, Pick up Object from Floor: Able to pick up shoe safely and easily From Standing Position, Turn to Look  Behind Over each Shoulder: Looks behind one side only/other side shows less weight shift Turn 360 Degrees: Able to turn 360 degrees safely but slowly Standing Unsupported, Alternately Place Feet on Step/Stool: Able to complete >2 steps/needs minimal assist Standing Unsupported, One Foot in Front: Able to plae foot ahead of the other independently and hold 30 seconds Standing on One Leg: Able to lift leg independently and hold equal to or more than 3 seconds Total Score: 46 Dynamic Standing Balance Dynamic Standing - Balance Support: During functional activity Dynamic Standing - Level of Assistance: 6: Modified independent (Device/Increase time) Extremity Assessment  RLE Assessment RLE Assessment: Within Functional Limits LLE Assessment LLE Assessment: Exceptions to [facility] LLE Strength LLE Overall Strength: Deficits LLE Overall Strength Comments: grossly 4 to 4-/5 except hip flexion 3-/5   See Function Navigator for Current Functional Status.  Syncere Kaminski, Murray Hodgkins 07/28/2016, 9:11 AM

## 2016-07-28 NOTE — Progress Notes (Signed)
Occupational Therapy Session Note  Patient Details  Name: LAURAMAE KNEISLEY MRN: [id number] Date of Birth: 1946-04-12  Today's Date: 07/28/2016 OT Individual Time: 1100-1200 OT Individual Time Calculation (min): 60 min    Short Term Goals: Week 1:  OT Short Term Goal 1 (Week 1): STGs=LTGS secondary to estimated LOS  Skilled Therapeutic Interventions/Progress Updates:    Pt seen for OT ADL bathing/dressing session. Pt sitting up in w/c upon arrival, agreeable to tx session. She ambulated thorughout room at supervision- mod I level. Required VCs throughout for hand placement during sit <> stand. She gathered clothing items and items for showering task. She bathed seated on shower chair, standing with assist of grab bars for steadying assist.   She dressed seated on toilet, assist from daughter for fastening bra. Grooming completed standing at sink mod I.  Completed 9 hole peg test: R: 26.42 seconds L: 2 min 29 seconds  Pt left seated in w/c set -up with lunch, all needs in reach.  Pt's daughter present for session. Showed therapist pictures of home bathroom set-up. Recommendations made for placement of grab bars and recommendations for technique of entering/exiting shower.   Therapy Documentation Precautions:  Precautions Precautions: Fall Restrictions Weight Bearing Restrictions: No Pain:   No/ denies pain  See Function Navigator for Current Functional Status.   Therapy/Group: Individual Therapy  Lewis, Maximina Pirozzi C 07/28/2016, 7:14 AM

## 2016-07-28 NOTE — Progress Notes (Signed)
Speech Language Pathology Discharge Summary  Patient Details  Name: Catherine [name] MRN: [id number] Date of Birth: [birth year]  Today's Date: 07/28/2016   Skilled treatment session #1 SLP Individual Time: 1030-1100 SLP Individual Time Calculation (min): 30 min   Skilled Therapeutic Interventions:    Skilled treatment session #1 - focused on speech intelligibility goals. SLP facilitated session by providing Mod I for use of compensatory speech intelligibility strategies. Pt with increased facial ROM and is able to reveal molars during smile. Pt is 100% intelligible for simple to complex conversation but continues to state that her voice sounds  Different to her and that she fears she will not be able to return to singing. Pt's voice appears within funcitonal limits and recommend she follow up with voice SLP or trainer.    Patient has met 1 of 1 long term goals.  Patient to discharge at overall Modified Independent level.  Reasons goals not met:     Clinical Impression/Discharge Summary:   Pt has made great progress and as a result she had met her ST LTGs. She continues to be 100% intelligible at the simple to complex conversation level.   Care Partner:  Caregiver Able to Provide Assistance: Yes     Recommendation:  Home Health SLP;Outpatient SLP  Rationale for SLP Follow Up: Maximize functional communication (Pt requests d/t vocal changes and pt enjoys singing several times/week)   Equipment: None   Reasons for discharge: Treatment goals met   Patient/Family Agrees with Progress Made and Goals Achieved: Yes   Function:    Cognition Comprehension Comprehension assist level: Follows complex conversation/direction with no assist  Expression   Expression assist level: Expresses complex 90% of the time/cues < 10% of the time  Social Interaction Social Interaction assist level: Interacts appropriately with others - No medications needed.  Problem Solving Problem solving assist  level: Solves complex problems: Recognizes & self-corrects  Memory Memory assist level: Complete Independence: No helper   Argenis Kumari B. Rutherford Nail, M.S., [place] 07/28/2016, 12:21 PM

## 2016-07-28 NOTE — Progress Notes (Signed)
Occupational Therapy Discharge Summary  Patient Details  Name: Catherine [name] MRN: [id number] Date of Birth: 11/10/1945  Patient has met 12 of 12 long term goals due to improved activity tolerance, improved balance, ability to compensate for deficits and functional use of  LEFT upper and LEFT lower extremity.  Patient to discharge at overall Modified Independent level.  Patient's care partner is independent to provide the necessary physical assistance at discharge.    Reasons goals not met: n/a  Recommendation:  Patient will benefit from ongoing skilled OT services in home health setting to continue to advance functional skills in the area of BADL and iADL.  Equipment: No equipment provided  Reasons for discharge: treatment goals met and discharge from hospital  Patient/family agrees with progress made and goals achieved: Yes  OT Discharge Precautions/Restrictions  Precautions Precautions: Fall Restrictions Weight Bearing Restrictions: No Pain  denies pain ADL ADL Eating: Modified independent Grooming: Modified independent Upper Body Bathing: Modified independent Lower Body Bathing: Modified independent Upper Body Dressing: Modified independent (Device) Lower Body Dressing: Modified independent Toileting: Modified independent Toilet Transfer: Modified independent Social research officer, government: Distant supervision ADL Comments: Please see functional navigator Vision/Perception  Vision- Assessment Eye Alignment: Within Functional Limits  Cognition Overall Cognitive Status: Within Functional Limits for tasks assessed Arousal/Alertness: Awake/alert Orientation Level: Oriented X4 Sensation Sensation Light Touch: Impaired Detail Light Touch Impaired Details: Impaired LUE;Impaired LLE Proprioception: Appears Intact Additional Comments: L UE and L LE Coordination Gross Motor Movements are Fluid and Coordinated: No Fine Motor Movements are Fluid and Coordinated:  No Coordination and Movement Description: Impaired L hand coordination Motor  Motor Motor: Hemiplegia Trunk/Postural Assessment  Cervical Assessment Cervical Assessment: Within Functional Limits Thoracic Assessment Thoracic Assessment: Within Functional Limits Lumbar Assessment Lumbar Assessment: Within Functional Limits Postural Control Postural Control: Within Functional Limits  Balance Balance Balance Assessed: Yes Static Sitting Balance Static Sitting - Balance Support: No upper extremity supported Static Sitting - Level of Assistance: 7: Independent Dynamic Sitting Balance Dynamic Sitting - Balance Support: No upper extremity supported Dynamic Sitting - Level of Assistance: 5: Stand by assistance Sitting balance - Comments: Sitting EOB no back support Static Standing Balance Static Standing - Balance Support: No upper extremity supported Static Standing - Level of Assistance: 5: Stand by assistance;4: Min assist Dynamic Standing Balance Dynamic Standing - Balance Support: During functional activity Dynamic Standing - Level of Assistance: 6: Modified independent (Device/Increase time) Extremity/Trunk Assessment RUE Assessment RUE Assessment: Within Functional Limits LUE Assessment LUE Assessment: Exceptions to [facility] (decreased strength of 3+/5 throughout)   See Function Navigator for Current Functional Status.  Daneen Schick Cyndel Griffey 07/28/2016, 8:29 PM

## 2016-07-29 DIAGNOSIS — G8194 Hemiplegia, unspecified affecting left nondominant side: Secondary | ICD-10-CM

## 2016-07-29 MED ORDER — CLOPIDOGREL BISULFATE 75 MG PO TABS: 75.0000 mg | ORAL_TABLET | Freq: Every day | ORAL | 0 refills | Status: DC

## 2016-07-29 MED ORDER — SALINE SPRAY 0.65 % NA SOLN: 1.0000 | Freq: Two times a day (BID) | NASAL | 0 refills | Status: DC

## 2016-07-29 MED ORDER — PANTOPRAZOLE SODIUM 40 MG PO TBEC
40.0000 mg | DELAYED_RELEASE_TABLET | Freq: Every day | ORAL | 0 refills | Status: DC
Start: 2016-07-30 — End: 2017-03-03

## 2016-07-29 MED ORDER — SIMVASTATIN 40 MG PO TABS: 40.0000 mg | ORAL_TABLET | Freq: Every day | ORAL | 0 refills | Status: DC

## 2016-07-29 NOTE — Plan of Care (Signed)
Problem: Food- and Nutrition-Related Knowledge Deficit (NB-1.1) Goal: Nutrition education Formal process to instruct or train a patient/client in a skill or to impart knowledge to help patients/clients voluntarily manage or modify food choices and eating behavior to maintain or improve health. Outcome: Completed/Met Date Met: 07/29/16 Nutrition Education Note  RD consulted for nutrition education regarding a Heart Healthy diet.   Lipid Panel     Component Value Date/Time   CHOL 193 07/20/2016 0514   TRIG 219 (H) 07/20/2016 0514   HDL 46 07/20/2016 0514   CHOLHDL 4.2 07/20/2016 0514   VLDL 44 (H) 07/20/2016 0514   LDLCALC 103 (H) 07/20/2016 0514    RD provided "Heart Healthy Nutrition Therapy" handout from the [employer]. Reviewed patient's dietary recall. Provided examples on ways to decrease sodium and fat intake in diet. Discouraged intake of processed foods and use of salt shaker. Encouraged fresh fruits and vegetables as well as whole grain sources of carbohydrates to maximize fiber intake. Teach back method used.  Expect good compliance.  Body mass index is 34.21 kg/m. Pt meets criteria for class I obesity based on current BMI.  Current diet order is heart healthy, patient is consuming approximately 90-100% of meals at this time. Labs and medications reviewed. No further nutrition interventions warranted at this time. RD contact information provided. Plans for discharge home today.   Corrin Parker, MS, RD, LDN Pager # [phone] After hours/ weekend pager # [phone]

## 2016-07-29 NOTE — Progress Notes (Signed)
Subjective/Complaints: Pt sitting up in her chair this AM.  She slept well overnight.  She is ready to be discharged.   ROS  Denies CP, SOB, N/V/D  Objective: Vital Signs: Blood pressure (!) 134/54, pulse 60, temperature 98 F (36.7 C), temperature source Oral, resp. rate 18, weight 87.6 kg (193 lb 2 oz), SpO2 98 %. No results found. Results for orders placed or performed during the hospital encounter of 07/21/16 (from the past 72 hour(s))  CBC     Status: None   Collection Time: 07/28/16  5:00 AM  Result Value Ref Range   WBC 5.6 4.0 - 10.5 K/uL   RBC 4.43 3.87 - 5.11 MIL/uL   Hemoglobin 13.0 12.0 - 15.0 g/dL   HCT 39.2 36.0 - 46.0 %   MCV 88.5 78.0 - 100.0 fL   MCH 29.3 26.0 - 34.0 pg   MCHC 33.2 30.0 - 36.0 g/dL   RDW 13.8 11.5 - 15.5 %   Platelets 235 150 - 400 K/uL     HEENT: Normocephalic, atraumatic. Cardio: RRR and no murmur Resp: CTA B/L and unlabored GI: BS positive and NT Skin:   Warm and dry. Intact.  Neuro: Alert/Oriented,  Sensory mildly diminished LT on Left  Motor 5/5 in RUE and RLE 4/5 LUE and LLE,  Mild Dysarthria Mild left facial droop Musc/Skel:  No edema. No tenderness Gen NAD. Vital signs reviewed.    Assessment/Plan: 1. Functional deficits secondary to Right basal ganglia infarct which require 3+ hours per day of interdisciplinary therapy in a comprehensive inpatient rehab setting. Physiatrist is providing close team supervision and 24 hour management of active medical problems listed below. Physiatrist and rehab team continue to assess barriers to discharge/monitor patient progress toward functional and medical goals. FIM: Function - Bathing Bathing activity did not occur: Refused Position: Shower Body parts bathed by patient: Right arm, Left arm, Chest, Abdomen, Front perineal area, Buttocks, Right upper leg, Left upper leg, Right lower leg, Left lower leg, Back Body parts bathed by helper: Right lower leg, Left lower leg Assist Level: More  than reasonable time  Function- Upper Body Dressing/Undressing Upper body dressing/undressing activity did not occur: Refused What is the patient wearing?: Bra, Pull over shirt/dress Bra - Perfomed by patient: Thread/unthread right bra strap, Thread/unthread left bra strap, Hook/unhook bra (pull down sports bra) Bra - Perfomed by helper: Hook/unhook bra (pull down sports bra) Pull over shirt/dress - Perfomed by patient: Thread/unthread right sleeve, Thread/unthread left sleeve, Put head through opening, Pull shirt over trunk Assist Level: More than reasonable time Set up : To obtain clothing/put away Function - Lower Body Dressing/Undressing What is the patient wearing?: Underwear, Pants, Non-skid slipper socks Position: Wheelchair/chair at sink Underwear - Performed by patient: Thread/unthread right underwear leg, Thread/unthread left underwear leg, Pull underwear up/down Underwear - Performed by helper: Pull underwear up/down Pants- Performed by patient: Pull pants up/down, Thread/unthread right pants leg, Thread/unthread left pants leg Pants- Performed by helper: Thread/unthread left pants leg, Pull pants up/down Non-skid slipper socks- Performed by patient: Don/doff right sock, Don/doff left sock Non-skid slipper socks- Performed by helper: Don/doff right sock, Don/doff left sock Socks - Performed by patient: Don/doff right sock, Don/doff left sock Socks - Performed by helper: Don/doff right sock, Don/doff left sock Shoes - Performed by patient: Don/doff left shoe, Don/doff right shoe Shoes - Performed by helper: Fasten right Assist for footwear: Independent Assist for lower body dressing: More than reasonable time  Function - Toileting Toileting steps completed by  patient: Adjust clothing prior to toileting, Performs perineal hygiene, Adjust clothing after toileting Toileting steps completed by helper: Adjust clothing after toileting Toileting Assistive Devices: Grab bar or  rail Assist level: Set up/obtain supplies  Function - Air cabin crew transfer assistive device: Grab bar, Walker Assist level to toilet: No Help, no cues, assistive device, takes more than a reasonable amount of time Assist level from toilet: No Help, no cues, assistive device, takes more than a reasonable amount of time  Function - Chair/bed transfer Chair/bed transfer method: Ambulatory Chair/bed transfer assist level: No Help, no cues, assistive device, takes more than a reasonable amount of time Chair/bed transfer assistive device: Armrests, Walker Chair/bed transfer details: Tactile cues for posture, Visual cues/gestures for precautions/safety, Visual cues/gestures for sequencing, Verbal cues for precautions/safety  Function - Locomotion: Wheelchair Will patient use wheelchair at discharge?: No Type: Manual Max wheelchair distance: 110 ft Assist Level: Supervision or verbal cues Assist Level: Supervision or verbal cues Function - Locomotion: Ambulation Assistive device: Walker-rolling Max distance: 200 ft Assist level: No help, No cues, assistive device, takes more than a reasonable amount of time Assist level: No help, No cues, assistive device, takes more than a reasonable amount of time Assist level: No help, No cues, assistive device, takes more than a reasonable amount of time Walk 150 feet activity did not occur: Safety/medical concerns Assist level: No help, No cues, assistive device, takes more than a reasonable amount of time Assist level: Supervision or verbal cues  Function - Comprehension Comprehension: Auditory Comprehension assist level: Understands complex 90% of the time/cues 10% of the time  Function - Expression Expression: Verbal Expression assist level: Expresses complex 90% of the time/cues < 10% of the time  Function - Social Interaction Social Interaction assist level: Interacts appropriately 90% of the time - Needs monitoring or  encouragement for participation or interaction.  Function - Problem Solving Problem solving assist level: Solves basic problems with no assist  Function - Memory Memory assist level: Recognizes or recalls 90% of the time/requires cueing < 10% of the time Patient normally able to recall (first 3 days only): Current season, Location of own room, Staff names and faces, That he or she is in a hospital  Medical Problem List and Plan: 1. Left hemiparesis and functional deficitssecondary to right basal ganglia infarct  D/c today  Will see patient in 1-2 weeks for transitional care management 2. DVT Prophylaxis/Anticoagulation: Pharmaceutical: Lovenox 3. Pain Management: tylenol prn for OA pain.  4. Mood: LCSW to follow for evaluation and support.  5. Neuropsych: This patient iscapable of making decisions on herown behalf. 6. Skin/Wound Care: Routine pressure relief measures.maculopapular eruption nasal-antibacterial soap  and neomycin cream-improving, added hydrocortisone for itching 7. Fluids/Electrolytes/Nutrition: Monitor I/Os   CBC WNL 4/3 8. Dyslipidemia:Now on zocor 40 mg daily. 9. GB disease/GERD: Managed by diet.  10.  Dysphagia - cont speech tx, intake is good 11.  Sleep disturbance: doesn't like bed - prn trazodone  LOS (Days) 8 A FACE TO FACE EVALUATION WAS PERFORMED  Juna Caban Lorie Phenix 07/29/2016, 8:40 AM

## 2016-07-29 NOTE — Discharge Instructions (Signed)
Inpatient Rehab Discharge Instructions  [place] Discharge date and time:  07/29/16  Activities/Precautions/ Functional Status: Activity: no lifting, driving, or strenuous exercise for till cleared by MD Diet: cardiac diet Wound Care: none needed   Functional status:  ___ No restrictions     ___ Walk up steps independently ___ 24/7 supervision/assistance   ___ Walk up steps with assistance _X__ Intermittent supervision/assistance  __X_ Bathe/dress independently ___ Walk with walker     ___ Bathe/dress with assistance ___ Walk Independently    ___ Shower independently ___ Walk with assistance    ___ Shower with assistance _X__ No alcohol     ___ Return to work/school ________   Special Instructions:   COMMUNITY REFERRALS UPON DISCHARGE:    Home Health:   PT, OT, [place]   Date of last service:07/29/2016  Medical Equipment/Items Ordered:HAS NEEDED EQUIPMENT FROM PAST SURGERIES    GENERAL COMMUNITY RESOURCES FOR PATIENT/FAMILY: Support Groups:[facility] SUPPORT GROUP EVERY SECOND Thursday @ 3:00-4:00 PM ON THE REHAB UNIT QUESTIONS CONTACT [facility] [phone]   STROKE/TIA DISCHARGE INSTRUCTIONS SMOKING Cigarette smoking nearly doubles your risk of having a stroke & is the single most alterable risk factor  If you smoke or have smoked in the last 12 months, you are advised to quit smoking for your health.  Most of the excess cardiovascular risk related to smoking disappears within a year of stopping.  Ask you doctor about anti-smoking medications  [place] Quit Line: 1-800-QUIT NOW  Free Smoking Cessation Classes [phone]  CHOLESTEROL Know your levels; limit fat & cholesterol in your diet  Lipid Panel     Component Value Date/Time   CHOL 193 07/20/2016 0514   TRIG 219 (H) 07/20/2016 0514   HDL 46 07/20/2016 0514   CHOLHDL 4.2 07/20/2016 0514   VLDL 44 (H) 07/20/2016 0514   LDLCALC 103 (H) 07/20/2016 0514      Many patients  benefit from treatment even if their cholesterol is at goal.  Goal: Total Cholesterol (CHOL) less than 160  Goal:  Triglycerides (TRIG) less than 150  Goal:  HDL greater than 40  Goal:  LDL (LDLCALC) less than 100   BLOOD PRESSURE American Stroke Association blood pressure target is less that 120/80 mm/Hg  Your discharge blood pressure is:  BP: (!) 135/52  Monitor your blood pressure  Limit your salt and alcohol intake  Many individuals will require more than one medication for high blood pressure  DIABETES (A1c is a blood sugar average for last 3 months) Goal HGBA1c is under 7% (HBGA1c is blood sugar average for last 3 months)  Diabetes:     Lab Results  Component Value Date   HGBA1C 5.4 07/20/2016     Your HGBA1c can be lowered with medications, healthy diet, and exercise.  Check your blood sugar as directed by your physician  Call your physician if you experience unexplained or low blood sugars.  PHYSICAL ACTIVITY/REHABILITATION Goal is 30 minutes at least 4 days per week  Activity: No driving, Therapies: See above Return to work: N/A  Activity decreases your risk of heart attack and stroke and makes your heart stronger.  It helps control your weight and blood pressure; helps you relax and can improve your mood.  Participate in a regular exercise program.  Talk with your doctor about the best form of exercise for you (dancing, walking, swimming, cycling).  DIET/WEIGHT Goal is to maintain a healthy weight  Your discharge diet is: Diet Heart Room  service appropriate? Yes; Fluid consistency: Thin liquids Your height is:   Your current weight is: Weight: 87.6 kg (193 lb 2 oz) Your Body Mass Index (BMI) is:    Following the type of diet specifically designed for you will help prevent another stroke.  Your goal weight is:  Your goal Body Mass Index (BMI) is 19-24.  Healthy food habits can help reduce 3 risk factors for stroke:  High cholesterol, hypertension, and  excess weight.  RESOURCES Stroke/Support Group:  Call [phone]   STROKE EDUCATION PROVIDED/REVIEWED AND GIVEN TO PATIENT Stroke warning signs and symptoms How to activate emergency medical system (call 911). Medications prescribed at discharge. Need for follow-up after discharge. Personal risk factors for stroke. Pneumonia vaccine given:  Flu vaccine given:  My questions have been answered, the writing is legible, and I understand these instructions.  I will adhere to these goals & educational materials that have been provided to me after my discharge from the hospital.     My questions have been answered and I understand these instructions. I will adhere to these goals and the provided educational materials after my discharge from the hospital.  Patient/Caregiver Signature _______________________________ Date __________  Clinician Signature _______________________________________ Date __________  Please bring this form and your medication list with you to all your follow-up doctor's appointments.

## 2016-07-29 NOTE — Progress Notes (Addendum)
Social Work  Discharge Note  The overall goal for the admission was met for:   Discharge location: Yes-HOME WITH DAUGHTER'S PROVIDING 24 HR SUPERVISION FOR A SHORT TIME  Length of Stay: Yes-8 DAYS  Discharge activity level: Yes-MOD/I LEVEL  Home/community participation: Yes  Services provided included: MD, RD, PT, OT, SLP, RN, CM, Pharmacy and SW  Financial Services: Medicare and Private Insurance: [employer]  Follow-up services arranged: Home Health: [facility]-PT,OT,SP and Patient/Family request agency HH: [facility] DAUGHTER WORKS THERE, DME: NO NEEDS  Comments (or additional information):PT REACHED HER GOALS QUICKLY AND PROGRESSED TO MEET HER GOALS AND WANTS TO GET HOME NOT SLEEPING HERE. DAUGHTER'S TO PROVIDE 24 HR SUPERVISION SHORT TERM. DAUGHTER'S QUESTIONS WERE ANSWERED REGARDING LTC POLICY AND FOLLOW UP AT [facility]  Patient/Family verbalized understanding of follow-up arrangements: Yes  Individual responsible for coordination of the follow-up plan: SELF & MICHELLE-DAUGHTER  Confirmed correct DME delivered: Elease Hashimoto 07/29/2016    Elease Hashimoto

## 2016-07-29 NOTE — Discharge Summary (Signed)
Physician Discharge Summary  [name] MRN: [id number] DOB/AGE: October 05, 1945 71 y.o.  Admit date: 07/21/2016 Discharge date: 07/29/2016  Discharge Diagnoses:  Principal Problem:   Basal ganglia infarction [facility]) Active Problems:   HLD (hyperlipidemia)   GERD (gastroesophageal reflux disease)   Left hemiparesis (HCC)   Discharged Condition: Stable   Significant Diagnostic Studies: Ct Angio Head W Or Wo Contrast  Result Date: 07/20/2016 CLINICAL DATA:  Initial evaluation for headache with right-sided weakness and numbness. EXAM: CT ANGIOGRAPHY HEAD AND NECK TECHNIQUE: Multidetector CT imaging of the head and neck was performed using the standard protocol during bolus administration of intravenous contrast. Multiplanar CT image reconstructions and MIPs were obtained to evaluate the vascular anatomy. Carotid stenosis measurements (when applicable) are obtained utilizing NASCET criteria, using the distal internal carotid diameter as the denominator. CONTRAST:  50 cc of Isovue 370. COMPARISON:  Prior CT from earlier same day. FINDINGS: CTA NECK FINDINGS Aortic arch: Visualized aortic arch of normal caliber with normal branch pattern. Minimal plaque noted within the arch itself. No high-grade stenosis about the origin of the great vessels. Visualized subclavian arteries widely patent. Right carotid system: Right common carotid artery patent from its origin to the bifurcation. Right ICA widely patent from the bifurcation to the skullbase. No stenosis, dissection, or vascular occlusion within the right carotid artery system. Left carotid system: Left common carotid artery patent from its origin to the bifurcation. No significant narrowing about the left bifurcation. Left ICA widely patent from the bifurcation to the skullbase. No stenosis, dissection, or vascular occlusion within the left carotid artery system. Vertebral arteries: Both of the vertebral arteries arise from the subclavian  arteries. Right vertebral artery dominant. Left vertebral artery diffusely hypoplastic. Vertebral arteries patent without stenosis, dissection, or occlusion. Skeleton: No acute osseous abnormality. No worrisome lytic or blastic osseous lesions. Multilevel degenerative spondylolysis, greatest at C5-6. Other neck: Soft tissues of the neck demonstrate no acute abnormality. Asymmetric enlargement of the right lobe of thyroid noted. No adenopathy. Upper chest: Visualized upper mediastinum within normal limits. Visualized lungs are clear. Review of the MIP images confirms the above findings CTA HEAD FINDINGS Anterior circulation: Petrous segments widely patent bilaterally. Minimal atheromatous plaque within the cavernous ICAs without flow-limiting stenosis. Supraclinoid segments widely patent. ICA termini patent. Left A1 segment widely patent. Right A1 segment hypoplastic/absent. Anterior communicating artery normal. Anterior cerebral arteries patent to their distal aspects. There is a focal high-grade stenosis within the distal left A2 branch (series 506, image 19). M1 segments widely patent without stenosis or occlusion. No proximal M2 occlusion. Distal MCA branches well opacified and symmetric. Posterior circulation: Dominant right vertebral artery widely patent to the vertebrobasilar junction. Moderate smooth narrowing of the hypoplastic left V4 segment (series 502, image 157). Left vertebral artery largely terminates in PICA, although a tiny branch adjacent towards the vertebrobasilar junction. Basilar artery widely patent. Superior cerebral arteries patent bilaterally. Left PCA supplied via the basilar and is widely patent to its distal aspect. Fetal type right PCA supplied via a widely patent right posterior communicating artery. Right PCA also patent to its distal aspect. Venous sinuses: Patent. Anatomic variants: Fetal type right PCA. No aneurysm or vascular malformation. Delayed phase: No pathologic enhancement.  Review of the MIP images confirms the above findings IMPRESSION: CTA NECK IMPRESSION: Negative CTA of the neck. No significant atheromatous disease for patient age. No high-grade or flow-limiting stenosis. CTA HEAD IMPRESSION: 1. Single short-segment severe left A2 stenosis. 2. Otherwise negative CTA. No other  high-grade or correctable stenosis. 3. 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 Dominant right vertebral artery widely patent to the vertebral basilar junction. Fetal type right PCA. 4. Hypoplastic/absent right A1 segment, with the anterior cerebral arteries supplied via the left internal carotid artery system. Electronically Signed   By: Jeannine Boga M.D.   On: 07/20/2016 01:46   Ct Head Wo Contrast  Result Date: 07/19/2016 CLINICAL DATA:  Weakness, facial droop EXAM: CT HEAD WITHOUT CONTRAST TECHNIQUE: Contiguous axial images were obtained from the base of the skull through the vertex without intravenous contrast. COMPARISON:  12/14/2012 FINDINGS: Brain: No intracranial hemorrhage, mass effect or midline shift. Minimal cerebral atrophy. Mild periventricular and patchy subcortical white matter decreased attenuation probable due to chronic small vessel ischemic changes. No definite acute cortical infarction. Vascular: Minimal atherosclerotic calcifications of carotid siphon Skull: No skull fracture. Sinuses/Orbits: No acute findings. Other: None IMPRESSION: No acute intracranial abnormality. Minimal cerebral atrophy. Mild periventricular and patchy subcortical white matter decreased attenuation probable due to chronic small vessel ischemic changes. No definite acute cortical infarction. Electronically Signed   By: Lahoma Crocker M.D.   On: 07/19/2016 15:48   Mr Brain Wo Contrast  Result Date: 07/20/2016 CLINICAL DATA:  Evaluation for slurred speech, right facial droop. EXAM: MRI HEAD WITHOUT CONTRAST TECHNIQUE: Multiplanar, multiecho pulse sequences of the brain and surrounding  structures were obtained without intravenous contrast. COMPARISON:  Prior CTA from 07/19/2016. FINDINGS: Brain: Diffuse prominence of the CSF containing spaces compatible with generalized cerebral atrophy. Patchy and confluent T2/FLAIR hyperintensity within the periventricular and deep white matter both cerebral hemispheres most compatible chronic microvascular disease, mild for age. Remote lacunar infarct present within the left thalamus. There is patchy abnormal restricted diffusion involving the posterior right lentiform nucleus extending towards the periventricular white matter of the right corona radiata (series 4, image 29). No associated hemorrhage or mass effect. No other evidence for acute or subacute ischemia. No other evidence for acute or chronic intracranial hemorrhage. No other areas of chronic infarction identified. No mass lesion, midline shift or mass effect. Ventricles normal in size without evidence for hydrocephalus. No extra-axial fluid collection. Major dural sinuses are grossly patent. Pituitary gland within normal limits. Vascular: Major intracranial vascular flow voids are maintained. Skull and upper cervical spine: Craniocervical junction within normal limits. Visualized upper cervical spine unremarkable. Bone marrow signal intensity within normal limits. No scalp soft tissue abnormality. Sinuses/Orbits: Globes and orbital soft tissues within normal limits. Patient status post lens extraction bilaterally. Scattered mucosal thickening within the ethmoidal air cells and maxillary sinuses. Paranasal sinuses are otherwise clear. Small bilateral mastoid effusions, right greater than left. Inner ear structures normal. Other: No other significant finding. IMPRESSION: 1. Patchy small volume acute ischemic nonhemorrhagic right basal ganglia infarct. 2. Small remote left thalamic lacunar infarct. 3. Generalized age-related cerebral atrophy with mild chronic microvascular ischemic disease.  Electronically Signed   By: Jeannine Boga M.D.   On: 07/20/2016 03:21    Labs:  Basic Metabolic Panel: BMP Latest Ref Rng & Units 07/22/2016 07/20/2016 07/19/2016  Glucose 65 - 99 mg/dL 102(H) 89 113(H)  BUN 6 - 20 mg/dL 14 10 11   Creatinine 0.44 - 1.00 mg/dL 0.70 0.66 0.80  Sodium 135 - 145 mmol/L 140 140 141  Potassium 3.5 - 5.1 mmol/L 3.6 4.3 3.9  Chloride 101 - 111 mmol/L 103 105 101  CO2 22 - 32 mmol/L 29 23 -  Calcium 8.9 - 10.3 mg/dL 9.3 8.9 -    CBC:  CBC Latest Ref Rng & Units 07/28/2016 07/22/2016 07/20/2016  WBC 4.0 - 10.5 K/uL 5.6 5.4 5.1  Hemoglobin 12.0 - 15.0 g/dL 13.0 13.4 12.9  Hematocrit 36.0 - 46.0 % 39.2 40.7 39.5  Platelets 150 - 400 K/uL 235 235 224    CBG:  Recent Labs Lab 07/25/16 1147  GLUCAP 91    Brief HPI:   Catherine [name] a 71 y.o.RHfemalewith history of TIA 2014, OA , GERD who was admitted on 07/19/2016 with unsteady gait, left-sided weakness and speech difficulty. MRI brain done revealing small right basal ganglia infarct with remote left thalamic lacunar infract. CTA head/neck revealed short segment severe left A2 segment. Dr. Erlinda Hong recommended changing ASA to plavix for stroke secondary to small vessel disease and and increasing statin dose. Therapy evaluations done and CIR recommended due to deficits in mobility and ADL tasks.    Hospital Course: Catherine [name] was admitted to rehab 07/21/2016 for inpatient therapies to consist of PT, ST and OT at least three hours five days a week. Past admission physiatrist, therapy team and rehab RN have worked together to provide customized collaborative inpatient rehab. Blood pressures have been well controlled. She is tolerating increase in Zocor without side effects at this time. She did have recurrence of chronic maculopapular rash under her nose which has improved with use of topical steroids. Protonix was added due to complaints of esophaeal irritation and history of GERD. Po intake has been good and  she is continent of bowel and bladder. Follow up labs reveal lytes and CBC to be WNL.  She has had improvement in verbal output and motor strength LUE/LLE. She has made great progress and is modified independent at discharge. She will continue to receive follow up HHPT, [place] and HHST by [facility] after discharge.    Rehab course: During patient's stay in rehab team conference was  held to monitor patient's progress, set goals and discuss barriers to discharge. At admission, patient required min assist with basic self care tasks and mobility. She presented with left facoa; weakness with mild flaccid dysarthria with decreased vocal intensity. She  has had improvement in activity tolerance, balance, postural control, as well as ability to compensate for deficits.  She is has had improvement in functional use LUE  and LLE as well as improved awareness. She is able to complete ADL tasks at modified independent level. She is modified independent for transfers and  to ambulate 200' with RW. She is able to use speech compensatory strategies at modified independent level and speech is 100% intelligible for simple and complex conversation. She has had increase in facial ROM. Family education was completed regarding all aspects of care.       Disposition: 01-Home or Self Care   Diet: Heart Healthy.   Special Instructions: 1. No driving or strenuous activity till cleared by MD.   Discharge Instructions    Ambulatory referral to Physical Medicine Rehab    Complete by:  As directed    1-2 weeks transitional care appt     Allergies as of 07/29/2016      Reactions   Crestor [rosuvastatin Calcium] Other (See Comments)   Muscle aches   Lipitor [atorvastatin] Diarrhea      Medication List    STOP taking these medications   GINKGO PO   TEA TREE OIL EX     TAKE these medications   ALOE VERA PO Take 1 tablet by mouth daily as needed (INFLAMMATION).   B-12  PO Take 1 tablet by mouth daily.    CINNAMON PO Take 1 tablet by mouth daily.   CITRACAL PO Take 1 tablet by mouth daily.   clopidogrel 75 MG tablet Commonly known as:  PLAVIX Take 1 tablet (75 mg total) by mouth daily.   OVER THE COUNTER MEDICATION Take 1 application by mouth daily as needed (NASAL RASH).   pantoprazole 40 MG tablet Commonly known as:  PROTONIX Take 1 tablet (40 mg total) by mouth daily. Start taking on:  07/30/2016   PROAIR HFA 108 (90 Base) MCG/ACT inhaler Generic drug:  albuterol Inhale 1-2 puffs into the lungs as needed for shortness of breath.   simvastatin 40 MG tablet Commonly known as:  ZOCOR Take 1 tablet (40 mg total) by mouth at bedtime.   sodium chloride 0.65 % Soln nasal spray Commonly known as:  OCEAN Place 1 spray into both nostrils 2 (two) times daily.      Follow-up Information    Charlett Blake, MD Follow up.   Specialty:  Physical Medicine and Rehabilitation Contact information: [place] [address] [phone]        Antony Contras, MD Follow up.   Specialties:  Neurology, Radiology Contact information: [address] [place] [address] [place], PA-C Follow up on 08/05/2016.   Why:  Appointment @ 2:15 PM Contact information: [place] [address] [phone]           Signed: Bary Leriche 07/29/2016, 7:37 PM

## 2016-07-29 NOTE — Progress Notes (Signed)
Pt. And family got d/c papers.Pt. Ready to go home with her daughters.

## 2016-07-29 NOTE — Progress Notes (Signed)
Social Work Elease Hashimoto, LCSW Social Worker Signed   Patient Care Conference Date of Service: 07/28/2016 11:12 AM      Hide copied text Hover for attribution information Inpatient RehabilitationTeam Conference and Plan of Care Update Date: 07/29/2016   Time: 9:00 AM      Patient Name: Catherine [name]      Medical Record Number: [id number]  Date of Birth: 16-Dec-1945 Sex: Female         Room/Bed: 4M08C/4M08C-02 Payor Info: Payor: MEDICARE / Plan: MEDICARE PART A AND B / Product Type: *No Product type* /     Admitting Diagnosis: Stroke SX  Admit Date/Time:  07/21/2016  4:34 PM Admission Comments: No comment available    Primary Diagnosis:  <principal problem not specified> Principal Problem: <principal problem not specified>       Patient Active Problem List    Diagnosis Date Noted  . Left hemiparesis ([place])    . GERD (gastroesophageal reflux disease) 07/23/2016  . Basal ganglia infarction ([place]) 07/21/2016  . Stroke (cerebrum) ([place]) 07/19/2016  . HLD (hyperlipidemia) 08/14/2014  . Obesity (BMI 30-39.9) 08/17/2013  . Hyperlipidemia LDL goal < 100 02/15/2013  . TIA (transient ischemic attack) 12/14/2012  . Essential hypertension 12/14/2012  . Other and unspecified hyperlipidemia 12/14/2012      Expected Discharge Date: Expected Discharge Date: 07/29/16   Team Members Present: Physician leading conference: Dr. Delice Lesch Social Worker Present: Ovidio Kin, LCSW Nurse Present: Heather Roberts, RN PT Present: Carney Living, PT OT Present: Napoleon Form, OT SLP Present: Stormy Fabian, SLP PPS Coordinator present : Daiva Nakayama, RN, CRRN       Current Status/Progress Goal Weekly Team Focus  Medical       adjusting BP and DM meds   medically stable     Bowel/Bladder     Continent of B&B; LBM: 07/29/2016  maintain current level of continence cont B & B monitor for changes in continence of bowel and bladder   Swallow/Nutrition/ Hydration               ADL's     Supervision- mod  I level  Mod I , supervision shower transfers  L Neuro re-ed; family ed, d/c planning   Mobility     mod I-supervision  mod I-supervision  L NMR, L awareness, standing balance, activity tolerance, pt education, discharge planning   Communication     Supervision cues at the simple conversation level  Mod I   use of speech intelligibility strategies to increase speech intelligiblity at the simple to complex conversation level   Safety/Cognition/ Behavioral Observations             Pain     Pt denies pain  Pain lesss than 3 less than 3 assess for pain Q shift and PRN   Skin     Maculopapular skin eruption, inferior to nasal vestibule  Skin free of further issues with skin integrity monitor no issues apply hibiclens to facial rash; neosporin and hydrocortisone to nares     *See Care Plan and progress notes for long and short-term goals.   Barriers to Discharge:   medical stability    Possible Resolutions to Barriers:    none    Discharge Planning/Teaching Needs:    Home with daughter's who will be providing 24 hr supervision level for a short time. Pt has done very well here and progressed quickly     Team Discussion:  Goals of mod/I level were reached yesterday and  pt made mod/ I in her room. Medically stable for DC today. Diabetes and HTN managed and stable. Family has been in for education and prepared for DC. Follow up home health therapies.  Revisions to Treatment Plan:  DC today      Elease Hashimoto 07/29/2016, 8:45 AM       Patient ID: [name], female   DOB: [birth year], 71 y.o.   MRN: [id number]

## 2016-07-30 ENCOUNTER — Telehealth: Payer: Self-pay | Admitting: *Deleted

## 2016-07-30 DIAGNOSIS — I69354 Hemiplegia and hemiparesis following cerebral infarction affecting left non-dominant side: Secondary | ICD-10-CM | POA: Diagnosis not present

## 2016-07-30 DIAGNOSIS — I69321 Dysphasia following cerebral infarction: Secondary | ICD-10-CM | POA: Diagnosis not present

## 2016-07-30 DIAGNOSIS — I69391 Dysphagia following cerebral infarction: Secondary | ICD-10-CM | POA: Diagnosis not present

## 2016-07-30 DIAGNOSIS — M150 Primary generalized (osteo)arthritis: Secondary | ICD-10-CM | POA: Diagnosis not present

## 2016-07-30 NOTE — Telephone Encounter (Signed)
Transitional Care call-I spoke with Catherine [name]    1. Are you/is patient experiencing any problems since coming home? Are there any questions regarding any aspect of care? No 2. Are there any questions regarding medications administration/dosing? Are meds being taken as prescribed? Patient should review meds with caller to confirm  Yes she has them 3. Have there been any falls? No 4. Has Home Health been to the house and/or have they contacted you? If not, have you tried to contact them? Can we help you contact them? They have contacted her. 5. Are bowels and bladder emptying properly? Are there any unexpected incontinence issues? If applicable, is patient following bowel/bladder programs? No 6. Any fevers, problems with breathing, unexpected pain? No 7. Are there any skin problems or new areas of breakdown? No 8. Has the patient/family member arranged specialty MD follow up (ie cardiology/neurology/renal/surgical/etc)?  Can we help arrange? No, appt given to see Dr Posey Pronto  9. Does the patient need any other services or support that we can help arrange? No 10. Are caregivers following through as expected in assisting the patient? Yes 11. Has the patient quit smoking, drinking alcohol, or using drugs as recommended? N/A  Appointment Friday 08/07/16 @ 1:20 , arrive time 1:00 to see Dr Posey Pronto Address reviewed [phone]

## 2016-07-31 DIAGNOSIS — I69354 Hemiplegia and hemiparesis following cerebral infarction affecting left non-dominant side: Secondary | ICD-10-CM | POA: Diagnosis not present

## 2016-07-31 DIAGNOSIS — I69391 Dysphagia following cerebral infarction: Secondary | ICD-10-CM | POA: Diagnosis not present

## 2016-08-04 DIAGNOSIS — I69391 Dysphagia following cerebral infarction: Secondary | ICD-10-CM | POA: Diagnosis not present

## 2016-08-04 DIAGNOSIS — I69354 Hemiplegia and hemiparesis following cerebral infarction affecting left non-dominant side: Secondary | ICD-10-CM | POA: Diagnosis not present

## 2016-08-05 DIAGNOSIS — E785 Hyperlipidemia, unspecified: Secondary | ICD-10-CM | POA: Diagnosis not present

## 2016-08-05 DIAGNOSIS — G8194 Hemiplegia, unspecified affecting left nondominant side: Secondary | ICD-10-CM | POA: Diagnosis not present

## 2016-08-05 DIAGNOSIS — I6930 Unspecified sequelae of cerebral infarction: Secondary | ICD-10-CM | POA: Diagnosis not present

## 2016-08-06 DIAGNOSIS — I69391 Dysphagia following cerebral infarction: Secondary | ICD-10-CM | POA: Diagnosis not present

## 2016-08-06 DIAGNOSIS — I69354 Hemiplegia and hemiparesis following cerebral infarction affecting left non-dominant side: Secondary | ICD-10-CM | POA: Diagnosis not present

## 2016-08-07 ENCOUNTER — Encounter: Payer: Medicare Other | Attending: Physical Medicine & Rehabilitation | Admitting: Physical Medicine & Rehabilitation

## 2016-08-07 ENCOUNTER — Encounter: Payer: Self-pay | Admitting: Physical Medicine & Rehabilitation

## 2016-08-07 VITALS — BP 137/84 | HR 76

## 2016-08-07 DIAGNOSIS — G479 Sleep disorder, unspecified: Secondary | ICD-10-CM

## 2016-08-07 DIAGNOSIS — Z820 Family history of epilepsy and other diseases of the nervous system: Secondary | ICD-10-CM | POA: Diagnosis not present

## 2016-08-07 DIAGNOSIS — Z823 Family history of stroke: Secondary | ICD-10-CM | POA: Diagnosis not present

## 2016-08-07 DIAGNOSIS — Z8673 Personal history of transient ischemic attack (TIA), and cerebral infarction without residual deficits: Secondary | ICD-10-CM | POA: Diagnosis not present

## 2016-08-07 DIAGNOSIS — I639 Cerebral infarction, unspecified: Secondary | ICD-10-CM | POA: Diagnosis not present

## 2016-08-07 DIAGNOSIS — R1310 Dysphagia, unspecified: Secondary | ICD-10-CM | POA: Diagnosis not present

## 2016-08-07 DIAGNOSIS — G8194 Hemiplegia, unspecified affecting left nondominant side: Secondary | ICD-10-CM

## 2016-08-07 DIAGNOSIS — R531 Weakness: Secondary | ICD-10-CM | POA: Diagnosis not present

## 2016-08-07 DIAGNOSIS — I69391 Dysphagia following cerebral infarction: Secondary | ICD-10-CM | POA: Diagnosis not present

## 2016-08-07 DIAGNOSIS — E669 Obesity, unspecified: Secondary | ICD-10-CM | POA: Diagnosis not present

## 2016-08-07 DIAGNOSIS — Z809 Family history of malignant neoplasm, unspecified: Secondary | ICD-10-CM | POA: Insufficient documentation

## 2016-08-07 DIAGNOSIS — I69354 Hemiplegia and hemiparesis following cerebral infarction affecting left non-dominant side: Secondary | ICD-10-CM

## 2016-08-07 DIAGNOSIS — M1711 Unilateral primary osteoarthritis, right knee: Secondary | ICD-10-CM

## 2016-08-07 DIAGNOSIS — R21 Rash and other nonspecific skin eruption: Secondary | ICD-10-CM | POA: Diagnosis not present

## 2016-08-07 DIAGNOSIS — Z9889 Other specified postprocedural states: Secondary | ICD-10-CM | POA: Insufficient documentation

## 2016-08-07 DIAGNOSIS — K219 Gastro-esophageal reflux disease without esophagitis: Secondary | ICD-10-CM | POA: Insufficient documentation

## 2016-08-07 DIAGNOSIS — I6381 Other cerebral infarction due to occlusion or stenosis of small artery: Secondary | ICD-10-CM

## 2016-08-07 NOTE — Addendum Note (Signed)
Addended by: Delice Lesch A on: 08/07/2016 02:12 PM   Modules accepted: Orders

## 2016-08-07 NOTE — Progress Notes (Addendum)
Subjective:    [name], female    DOB: 1945/07/06, 71 y.o.   MRN: [id number]  HPI 71 y.o. RH female with history of TIA 2014, OA , GERD presents for transitional care management after receiving CIR after right basal ganglia infarct with remote left thalamic lacunar infracts.  Admit date: 07/21/2016 Discharge date: 07/29/2016  At discharge, she was encouraged to refrain from driving, which she has been complaint with.  She was to follow up with Neurology, with whom she has an appointment. She has her PCP.  Her knee OA is under control.  Her rash has improved. She notes some difficulty with swallowing, but managing with smaller bites.  Sleep is improving.  DME: Previously had Therapies: 2/week Mobility: Walker at home, cane with supervision  Pain Inventory Average Pain 0 Pain Right Now 0 My pain is na  In the last 24 hours, has pain interfered with the following? General activity 2 Relation with others 0 Enjoyment of life 5 What TIME of day is your pain at its worst? na Sleep (in general) Fair  Pain is worse with: na Pain improves with: na Relief from Meds: na  Mobility use a cane use a walker ability to climb steps?  yes do you drive?  no  Function not employed: date last employed . I need assistance with the following:  bathing, meal prep, household duties and shopping  Neuro/Psych weakness trouble walking loss of taste or smell  Prior Studies Any changes since last visit?  no  Physicians involved in your care Primary care dr. Nancy Fetter   Family History  Problem Relation Age of Onset  . Dementia Mother   . Cancer Mother   . Stroke Mother   . Cancer Father    Social History   Social History  . Marital status: Widowed    Spouse name: N/A  . Number of children: 3  . Years of education: college   Occupational History  . retired    Social History Main Topics  . Smoking status: Never Smoker  . Smokeless tobacco: Never Used  . Alcohol  use No  . Drug use: No  . Sexual activity: Yes   Other Topics Concern  . Not on file   Social History Narrative   Husband passes away last yr. 70.   Past Surgical History:  Procedure Laterality Date  . EYE SURGERY     Past Medical History:  Diagnosis Date  . Arthritis   . Cataract   . GERD (gastroesophageal reflux disease)   . TIA (transient ischemic attack)    There were no vitals taken for this visit.  Opioid Risk Score:   Fall Risk Score:  `1  Depression screen PHQ 2/9  No flowsheet data found.  Review of Systems  Constitutional: Negative.   HENT: Negative.   Eyes: Negative.   Respiratory: Positive for cough and shortness of breath.   Cardiovascular: Negative.   Gastrointestinal: Positive for constipation.  Endocrine: Negative.   Genitourinary: Negative.   Musculoskeletal: Negative.   Skin: Positive for rash.  Allergic/Immunologic: Negative.   Neurological: Negative.   Hematological: Negative.   Psychiatric/Behavioral: Negative.   All other systems reviewed and are negative.      Objective:   Physical Exam  Gen NAD. Vital signs reviewed.  HEENT: Normocephalic, atraumatic. Cardio: RRR and no murmur Resp: CTA B/L and Unlabored GI: BS positive and NT Musc/Skel:  No edema. No tenderness Neuro: Alert/Oriented x3  Sensation intact to light  touch throughout Motor 5/5 in RUE and RLE LUE: 4+/5 LLE: HF 4/5, KE, ADF/PF 4+/5  Mild Dysarthria Left facial droop No increase in tone Skin:   Warm and dry. Intact. Rash on right nasolabial fold    Assessment & Plan:  71 y.o. RH female with history of TIA 2014, OA , GERD presents for transitional care management after receiving CIR after right basal ganglia infarct with remote left thalamic lacunar infracts.  1. Left hemiparesis and functional deficits secondary to right basal ganglia infarct  Cont therapies  Cont follow up with Neurology  Cont meds  2. Right knee OA  Cont conservative measures  3.  Maculopapular nasal rash  Follow up with Derm  4. Dysphagia   Cont therapies  Encouraged small bites and limit distraction  5. Sleep disturbance  Improving  Encouraged sleep hygiene  6. Diet/Obesity  Will refer to dietitian due to numerous questions regarding supplements and improved diet    Meds reviewed Referrals reviewed All questions answered

## 2016-08-11 DIAGNOSIS — I69391 Dysphagia following cerebral infarction: Secondary | ICD-10-CM | POA: Diagnosis not present

## 2016-08-11 DIAGNOSIS — I69354 Hemiplegia and hemiparesis following cerebral infarction affecting left non-dominant side: Secondary | ICD-10-CM | POA: Diagnosis not present

## 2016-08-13 ENCOUNTER — Other Ambulatory Visit: Payer: Self-pay | Admitting: *Deleted

## 2016-08-13 ENCOUNTER — Encounter: Payer: Self-pay | Admitting: *Deleted

## 2016-08-13 DIAGNOSIS — I69354 Hemiplegia and hemiparesis following cerebral infarction affecting left non-dominant side: Secondary | ICD-10-CM | POA: Diagnosis not present

## 2016-08-13 DIAGNOSIS — I69391 Dysphagia following cerebral infarction: Secondary | ICD-10-CM | POA: Diagnosis not present

## 2016-08-13 NOTE — Patient Outreach (Signed)
[facility]) Care Management  08/13/2016  [place] February 17, 1946 [id number]  EMMI-Stroke referral via red dashboard for feeling worse overall & new problem:  Telephone call to patient who states she did not say yes to either question.  States she is feeling well overall & does not have any new problems.   Voices that she is using walker to get around & is able to walk outside. Voices that she had therapy in inpatient rehabilitation & has home health therapy since coming home.   Voices that she takes medications as prescribed & is attending MD appointments as scheduled. Patient voices understanding of importance of taking medications as prescribed & attending MD appointments.  States her daughter & grandson assist her as needed.    States she is aware of stroke symptoms & plans to call 911 if they occur. Patient states she received stroke booklet & has read through it.   Voices no concerns at this time. EMMI red dashboard has been addressed.  Plan: Send EMMI-Falls prevention literature. Close case/send to case management assistant.  Sherrin Daisy, RN BSN [place] Management Coordinator [facility] Care Management  [phone]

## 2016-08-14 DIAGNOSIS — I69354 Hemiplegia and hemiparesis following cerebral infarction affecting left non-dominant side: Secondary | ICD-10-CM | POA: Diagnosis not present

## 2016-08-14 DIAGNOSIS — I69391 Dysphagia following cerebral infarction: Secondary | ICD-10-CM | POA: Diagnosis not present

## 2016-08-17 DIAGNOSIS — I69354 Hemiplegia and hemiparesis following cerebral infarction affecting left non-dominant side: Secondary | ICD-10-CM | POA: Diagnosis not present

## 2016-08-17 DIAGNOSIS — I69391 Dysphagia following cerebral infarction: Secondary | ICD-10-CM | POA: Diagnosis not present

## 2016-08-18 DIAGNOSIS — I69391 Dysphagia following cerebral infarction: Secondary | ICD-10-CM | POA: Diagnosis not present

## 2016-08-18 DIAGNOSIS — I69354 Hemiplegia and hemiparesis following cerebral infarction affecting left non-dominant side: Secondary | ICD-10-CM | POA: Diagnosis not present

## 2016-08-19 DIAGNOSIS — J383 Other diseases of vocal cords: Secondary | ICD-10-CM | POA: Diagnosis not present

## 2016-08-19 DIAGNOSIS — Z8673 Personal history of transient ischemic attack (TIA), and cerebral infarction without residual deficits: Secondary | ICD-10-CM | POA: Diagnosis not present

## 2016-08-19 DIAGNOSIS — I69321 Dysphasia following cerebral infarction: Secondary | ICD-10-CM | POA: Diagnosis not present

## 2016-08-19 DIAGNOSIS — R490 Dysphonia: Secondary | ICD-10-CM | POA: Diagnosis not present

## 2016-08-20 ENCOUNTER — Other Ambulatory Visit (HOSPITAL_COMMUNITY): Payer: Self-pay | Admitting: Family Medicine

## 2016-08-20 DIAGNOSIS — I69354 Hemiplegia and hemiparesis following cerebral infarction affecting left non-dominant side: Secondary | ICD-10-CM | POA: Diagnosis not present

## 2016-08-20 DIAGNOSIS — I69391 Dysphagia following cerebral infarction: Secondary | ICD-10-CM | POA: Diagnosis not present

## 2016-08-20 DIAGNOSIS — R1319 Other dysphagia: Secondary | ICD-10-CM

## 2016-08-21 ENCOUNTER — Other Ambulatory Visit: Payer: Self-pay | Admitting: Physical Medicine and Rehabilitation

## 2016-08-24 DIAGNOSIS — I69391 Dysphagia following cerebral infarction: Secondary | ICD-10-CM | POA: Diagnosis not present

## 2016-08-24 DIAGNOSIS — I69354 Hemiplegia and hemiparesis following cerebral infarction affecting left non-dominant side: Secondary | ICD-10-CM | POA: Diagnosis not present

## 2016-08-25 ENCOUNTER — Ambulatory Visit (HOSPITAL_COMMUNITY)
Admission: RE | Admit: 2016-08-25 | Discharge: 2016-08-25 | Disposition: A | Discharge status: Home / Self Care | Payer: Medicare Other | Source: Ambulatory Visit | Attending: Family Medicine | Admitting: Family Medicine

## 2016-08-25 DIAGNOSIS — R1319 Other dysphagia: Secondary | ICD-10-CM | POA: Diagnosis present

## 2016-08-25 DIAGNOSIS — I69021 Dysphasia following nontraumatic subarachnoid hemorrhage: Secondary | ICD-10-CM | POA: Insufficient documentation

## 2016-08-25 DIAGNOSIS — I69354 Hemiplegia and hemiparesis following cerebral infarction affecting left non-dominant side: Secondary | ICD-10-CM | POA: Diagnosis not present

## 2016-08-25 DIAGNOSIS — I69891 Dysphagia following other cerebrovascular disease: Secondary | ICD-10-CM | POA: Insufficient documentation

## 2016-08-25 DIAGNOSIS — I69391 Dysphagia following cerebral infarction: Secondary | ICD-10-CM | POA: Diagnosis not present

## 2016-08-25 DIAGNOSIS — M1990 Unspecified osteoarthritis, unspecified site: Secondary | ICD-10-CM | POA: Insufficient documentation

## 2016-08-25 DIAGNOSIS — I69321 Dysphasia following cerebral infarction: Secondary | ICD-10-CM | POA: Insufficient documentation

## 2016-08-25 DIAGNOSIS — K219 Gastro-esophageal reflux disease without esophagitis: Secondary | ICD-10-CM | POA: Insufficient documentation

## 2016-08-25 NOTE — Progress Notes (Signed)
Modified Barium Swallow Progress Note  Patient Details  Name: Catherine [name] MRN: [id number] Date of Birth: [birth year]  Today's Date: 08/25/2016  Modified Barium Swallow completed.  Full report located under Chart Review in the Imaging Section.  Brief recommendations include the following:  Clinical Impression  Pt's oropharyngeal swallow is WFL with no aspiration observed. She does have occasional flash penetration of thin liquids, although this is not necessarily abnormal for her age. Pt shared her concern about incomplete vocal fold closure, but this does not appear to functionally impact her at this time given adequate airway protection and clear voicing during this study. Given pt's h/o esophageal issues (GERD, HH, Schiatzki's ring), may wish to consider possible esophageal sources for her symptoms as well. Recommend regular textures and thin liquids as tolerated, with esophageal precautions handout provided.   Swallow Evaluation Recommendations   Recommended Consults: Consider esophageal assessment   SLP Diet Recommendations: Regular solids;Thin liquid   Liquid Administration via: Cup;Straw   Medication Administration: Whole meds with liquid   Supervision: Patient able to self feed   Compensations: Slow rate;Small sips/bites;Follow solids with liquid   Postural Changes: Remain semi-upright after after feeds/meals (Comment);Seated upright at 90 degrees   Oral Care Recommendations: Oral care BID        Germain Osgood 08/25/2016,1:30 PM   Germain Osgood, M.A. CCC-SLP [phone]

## 2016-08-26 DIAGNOSIS — I69354 Hemiplegia and hemiparesis following cerebral infarction affecting left non-dominant side: Secondary | ICD-10-CM | POA: Diagnosis not present

## 2016-08-26 DIAGNOSIS — I69391 Dysphagia following cerebral infarction: Secondary | ICD-10-CM | POA: Diagnosis not present

## 2016-09-01 DIAGNOSIS — I69391 Dysphagia following cerebral infarction: Secondary | ICD-10-CM | POA: Diagnosis not present

## 2016-09-01 DIAGNOSIS — I69354 Hemiplegia and hemiparesis following cerebral infarction affecting left non-dominant side: Secondary | ICD-10-CM | POA: Diagnosis not present

## 2016-09-08 DIAGNOSIS — I69354 Hemiplegia and hemiparesis following cerebral infarction affecting left non-dominant side: Secondary | ICD-10-CM | POA: Diagnosis not present

## 2016-09-08 DIAGNOSIS — I69391 Dysphagia following cerebral infarction: Secondary | ICD-10-CM | POA: Diagnosis not present

## 2016-09-22 ENCOUNTER — Ambulatory Visit (INDEPENDENT_AMBULATORY_CARE_PROVIDER_SITE_OTHER): Payer: Medicare Other | Admitting: Neurology

## 2016-09-22 ENCOUNTER — Encounter: Payer: Self-pay | Admitting: Neurology

## 2016-09-22 VITALS — BP 165/81 | HR 67 | Ht 63.0 in | Wt 175.0 lb

## 2016-09-22 DIAGNOSIS — I639 Cerebral infarction, unspecified: Secondary | ICD-10-CM

## 2016-09-22 DIAGNOSIS — R29898 Other symptoms and signs involving the musculoskeletal system: Secondary | ICD-10-CM | POA: Diagnosis not present

## 2016-09-22 DIAGNOSIS — R29818 Other symptoms and signs involving the nervous system: Secondary | ICD-10-CM

## 2016-09-22 DIAGNOSIS — I69354 Hemiplegia and hemiparesis following cerebral infarction affecting left non-dominant side: Secondary | ICD-10-CM

## 2016-09-22 NOTE — Progress Notes (Signed)
[facility]    Provider:  Dr Jaynee Eagles Referring Provider: Zacarias Pontes Primary Care Physician:  Dr. Nancy Fetter  CC:  F/u stroke  HPI:  Catherine [name] is a 71 y.o. female here as a referral from [facility] after discharge. Past medical history arthritis, TIA. Per review of notes patient presented on 07/19/2016 with headache, right facial numbness, right lower extremity weakness, speech changes and difficulty walking. Earlier that day she felt extremely tired and headache and was speaking more slowly than normal. She felt unsteady walking. She noted she had a facial droop and possible weakness of the left arm. When evaluated by neurology she was not endorsing any symptoms apart from ongoing sensory changes in the right side of her face and a mild headache. She had a reported TIA in 2014. She takes aspirin 81 mg daily. Symptoms were resolved when neurologist examined her in the emergency room. Symptoms were in the setting of lots of family stress. When the stroke team saw her she still reported left facial droop and mild left hand and left foot weakness. MRI showed patchy small volume acute ischemic nonhemorrhagic right basal ganglia infarcts and left remote thalamic lacunar infarct. Patient was changed to Plavix. EEG was negative for epileptiform or seizure activity. She is having difficulty with walking due to back problems and knee pain.   Patient says she wa singing in the choir. She noticed her left leg a little weak.She was slow. She went to eat. She wasn;t feeling well. Here with daughter who provides much information. Her leg became weaker and she couldn;t walk. She went to the ED. She went right in. She has back pain and isn't walking quite right, the weakness has improved. She still feels tired. She still can't sing. She has weakness on the left hand and it doesn't work. She is using it constantly and notices the fine motor. She has some coughing but no choking. She is taking her time.  Daughter is here with her and also provides information. Patient says she worries about Zocor because she feels fatigued and her brother had to go down negatives, I encouraged her to continue it as managing cholesterol is very important stroke prevention. She worries about nosebleeds with Plavix, she has not had any or any increased symptoms or signs of bleeding, also discussed the sitting on the Plavix is important in prevention of strokes.   Reviewed notes, labs and imaging from outside physicians, which showed:  LDL 103  Ct Angio Head and neck W Or Wo Contrast 07/20/2016 IMPRESSION: CTA NECK IMPRESSION: Negative CTA of the neck. No significant atheromatous disease for patient age. No high-grade or flow-limiting stenosis. CTA HEAD IMPRESSION: 1. Single short-segment severe left A2 stenosis. 2. Otherwise negative CTA. No other high-grade or correctable stenosis. 3. 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 Dominant right vertebral artery widely patent to the vertebral basilar junction. Fetal type right PCA. 4. Hypoplastic/absent right A1 segment, with the anterior cerebral arteries supplied via the left internal carotid artery system.   Ct Head Wo Contrast 07/19/2016 IMPRESSION: No acute intracranial abnormality. Minimal cerebral atrophy. Mild periventricular and patchy subcortical white matter decreased attenuation probable due to chronic small vessel ischemic changes. No definite acute cortical infarction.   Mr Brain Wo Contrast 07/20/2016 IMPRESSION: 1. Patchy small volume acute ischemic nonhemorrhagic right basal ganglia infarct. 2. Small remote left thalamic lacunar infarct. 3. Generalized age-related cerebral atrophy with mild chronic microvascular ischemic disease.   Review of Systems: Patient complains  of symptoms per HPI as well as the following symptoms: Patient endorses back pain and joint pain. No rash, no fever, no new focal neurologic deficits. Pertinent  negatives and positives per HPI. All others negative.   Social History   Social History  . Marital status: Widowed    Spouse name: N/A  . Number of children: 3  . Years of education: College   Occupational History  . Retired    Social History Main Topics  . Smoking status: Never Smoker  . Smokeless tobacco: Never Used  . Alcohol use No  . Drug use: No  . Sexual activity: Yes   Other Topics Concern  . Not on file   Social History Narrative   Husband passed away in 10-Aug-2013.   Now lives w/ her daughter.    Family History  Problem Relation Age of Onset  . Dementia Mother   . Cancer Mother   . Stroke Mother   . Cancer Father     Past Medical History:  Diagnosis Date  . Arthritis   . Cataract   . GERD (gastroesophageal reflux disease)   . Stroke ([place])   . TIA (transient ischemic attack)     Past Surgical History:  Procedure Laterality Date  . EYE SURGERY      Current Outpatient Prescriptions  Medication Sig Dispense Refill  . Calcium Citrate (CITRACAL PO) Take 1 tablet by mouth daily.    . clopidogrel (PLAVIX) 75 MG tablet Take 1 tablet (75 mg total) by mouth daily. 30 tablet 0  . Cyanocobalamin (B-12 PO) Take 1 tablet by mouth daily.    . pantoprazole (PROTONIX) 40 MG tablet Take 1 tablet (40 mg total) by mouth daily. 30 tablet 0  . simvastatin (ZOCOR) 40 MG tablet Take 1 tablet (40 mg total) by mouth at bedtime. 30 tablet 0  . sodium chloride (OCEAN) 0.65 % SOLN nasal spray Place 1 spray into both nostrils 2 (two) times daily.  0   No current facility-administered medications for this visit.     Allergies as of 09/22/2016 - Review Complete 09/22/2016  Allergen Reaction Noted  . Crestor [rosuvastatin calcium] Other (See Comments) 07/21/2016  . Lipitor [atorvastatin] Diarrhea 07/21/2016    Vitals: BP (!) 165/81   Pulse 67   Ht 5\' 3"  (1.6 m)   Wt 175 lb (79.4 kg) Comment: Reported  BMI 31.00 kg/m  Last Weight:  Wt Readings from Last 1 Encounters:    09/22/16 175 lb (79.4 kg)   Last Height:   Ht Readings from Last 1 Encounters:  09/22/16 5\' 3"  (1.6 m)    Physical exam: Exam: Gen: NAD, conversant, well nourised, obese, well groomed                     CV: RRR, no MRG. No Carotid Bruits. No peripheral edema, warm, nontender Eyes: Conjunctivae clear without exudates or hemorrhage  Neuro: Detailed Neurologic Exam  Speech:    Speech is normal; fluent and spontaneous with normal comprehension.  Cognition:    The patient is oriented to person, place, and time;     recent and remote memory intact;     language fluent;     normal attention, concentration,     fund of knowledge Cranial Nerves:    The pupils are equal, round, and reactive to light. Attempted funduscopic exam could not visualize. Visual fields are full to finger confrontation. Extraocular movements are intact. Trigeminal sensation is intact and the muscles of mastication  are normal. Mild left lower facial weakness. The palate elevates in the midline. Hearing intact. Voice is normal. Shoulder shrug is normal. The tongue has normal motion without fasciculations.   Coordination:    No dysmetria. Decreased fine motor movements of the left hand.  Gait:   Antalgic due to low back pain  Motor Observation:    No asymmetry, no atrophy, and no involuntary movements noted. Tone:    Normal muscle tone.    Posture:    Posture is normal.     Strength:    Mild left deltoid and bilateral lower extremity proximal weakness. Otherwise intact.     Sensation: intact to LT     Reflex Exam:  DTR's:    Deep tendon reflexes in the upper and lower extremities are symmetrical bilaterally.   Toes:    The toes are equivocal bilaterally.   Clonus:    Clonus is absent.   Assessment/Plan:  This is a 71 year old patient here for follow-up after a right basal ganglia infarct secondary to small vessel disease in March 2018. She has resultant mild and improving left facial droop with  left arm and hand mild weakness, bilateral LE proximal weakness left > right. She was on aspirin 81 mg prior and was changed to Plavix on discharge. She was continued on Zocor(increased dose)   and omega-3 for hyperlipidemia, hemoglobin A1c was normal. Patient still experiencing weakness will refer to physical therapy outpatient as I feel patient could still benefit from PT as well as OT for fine motor movements of the left hand. She feels she is having fatigue on the Zocor however I did encourage her to continue use and see primary care as management of cholesterol is very important in prevention of strokes. She says that she is worried about nosebleeds and bleeding with Plavix but Plavix is also important as an antiplatelet for prevention of strokes. Asked her to see her primary care in the next 2 weeks to follow and see how she is doing.  I had a long d/w patient about her recent stroke, risk for recurrent stroke/TIAs, personally independently reviewed imaging studies and stroke evaluation results and answered questions.Continue Plavix for secondary stroke prevention and maintain strict control of hypertension with blood pressure goal below 130/90, diabetes with hemoglobin A1c goal below 6.5% and lipids with LDL cholesterol goal below 70 mg/dL. I also advised the patient to eat a healthy diet with plenty of whole grains, cereals, fruits and vegetables, exercise regularly and maintain ideal body weight   Patient can follow up in 4-6 months with Vaughan Browner meant that time if she is stable or improved can be discharged back to primary care.  Orders Placed This Encounter  Procedures  . Ambulatory referral to Physical Therapy  . Ambulatory referral to Occupational Therapy    Sarina Ill, MD  [facility] [address] [place], [place] [address]  Phone [phone] Fax [phone]

## 2016-10-05 DIAGNOSIS — I69354 Hemiplegia and hemiparesis following cerebral infarction affecting left non-dominant side: Secondary | ICD-10-CM | POA: Diagnosis not present

## 2016-10-05 DIAGNOSIS — E785 Hyperlipidemia, unspecified: Secondary | ICD-10-CM | POA: Diagnosis not present

## 2016-10-05 DIAGNOSIS — I6930 Unspecified sequelae of cerebral infarction: Secondary | ICD-10-CM | POA: Diagnosis not present

## 2016-10-05 DIAGNOSIS — L237 Allergic contact dermatitis due to plants, except food: Secondary | ICD-10-CM | POA: Diagnosis not present

## 2016-10-13 ENCOUNTER — Ambulatory Visit: Payer: Medicare Other | Attending: Neurology | Admitting: Physical Therapy

## 2016-10-13 ENCOUNTER — Ambulatory Visit: Payer: Medicare Other | Admitting: Occupational Therapy

## 2016-10-13 DIAGNOSIS — R2681 Unsteadiness on feet: Secondary | ICD-10-CM | POA: Diagnosis not present

## 2016-10-13 DIAGNOSIS — M6281 Muscle weakness (generalized): Secondary | ICD-10-CM | POA: Diagnosis not present

## 2016-10-13 DIAGNOSIS — R278 Other lack of coordination: Secondary | ICD-10-CM | POA: Diagnosis not present

## 2016-10-13 DIAGNOSIS — R208 Other disturbances of skin sensation: Secondary | ICD-10-CM | POA: Insufficient documentation

## 2016-10-13 DIAGNOSIS — R2689 Other abnormalities of gait and mobility: Secondary | ICD-10-CM

## 2016-10-13 DIAGNOSIS — R41842 Visuospatial deficit: Secondary | ICD-10-CM | POA: Insufficient documentation

## 2016-10-13 NOTE — Therapy (Signed)
[facility] [address] [place], [place], [address] Phone: [phone]   Fax:  [phone]  Occupational Therapy Evaluation  Patient Details  Name: Catherine [name] MRN: [id number] Date of Birth: June 13, 1945 Referring Provider: Dr. Sarina Ill  Encounter Date: 10/13/2016      OT End of Session - 10/13/16 1404    Visit Number 1   Number of Visits 17   Date for OT Re-Evaluation 12/12/16   Authorization Type MCR/BCBS - G code needed   Authorization - Visit Number 1   Authorization - Number of Visits 10   OT Start Time 4081   OT Stop Time 1400   OT Time Calculation (min) 55 min   Activity Tolerance Patient tolerated treatment well      Past Medical History:  Diagnosis Date  . Arthritis   . Cataract   . GERD (gastroesophageal reflux disease)   . Stroke ([place])   . TIA (transient ischemic attack)     Past Surgical History:  Procedure Laterality Date  . EYE SURGERY      There were no vitals filed for this visit.      Subjective Assessment - 10/13/16 1307    Pertinent History 07/19/16: CVA.  PMH: TIA 2014, OA, GERD, Rt TKR 2008   Limitations No lifting > 10 lbs   Patient Stated Goals improve fine motor skills   Currently in Pain? Yes  O.T. will not be directly addressing   Pain Score 3    Pain Location Knee  and coccyx   Pain Orientation Right   Pain Descriptors / Indicators Aching   Pain Onset More than a month ago   Pain Frequency Intermittent   Aggravating Factors  first thing in am, walking too long   Pain Relieving Factors OTC meds           [facility] OT Assessment - 10/13/16 0001      Assessment   Diagnosis CVA  Lt non dominant hemiparesis   Referring Provider Dr. Sarina Ill   Onset Date 07/19/16   Assessment Pt cleared for driving and has been doing short distances only   Prior Therapy [facility] 3/27 - 07/29/16, Followed by [facility] therapies     Precautions   Precautions --   Precaution Comments  NO lifting greater than 10 lbs (per neurologist)      Balance Screen   Has the patient fallen in the past 6 months Yes  on ice 05/2016   How many times? 1   Has the patient had a decrease in activity level because of a fear of falling?  No   Is the patient reluctant to leave their home because of a fear of falling?  No     Home  Environment   Writer  with handrails    Additional Comments Pt's daughter and grandson living with her. 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 Pt lives in 1 story home (w/c accessible) with 1 step to enter. DME: w/c, walker, BSC (Pt doesn't use)    Lives With Family     Prior Function   Level of Independence Independent  prior to 07/19/16   Vocation Retired   Leisure taking care of the farm, Haematologist, gardening, singing in the choir     ADL   Eating/Feeding Independent   Grooming Independent   Scientist, clinical (histocompatibility and immunogenetics) Independent   Lower Body Bathing Independent   Upper Body Dressing Increased time  Lower Body Dressing Increased time   Toilet Transfer Independent  elevated commode   Toileting - Water engineer -  Hygiene Independent   Tub/Shower Transfer Modified independent     IADL   Shopping Takes care of all shopping needs independently   Light Housekeeping Performs light daily tasks such as dishwashing, bed making;Does personal laundry completely  "doing it all" just a little slower   Meal Prep Plans, prepares and serves adequate meals independently   Programmer, applications own vehicle   Medication Management Is responsible for taking medication in correct dosages at correct time   Physiological scientist financial matters independently (budgets, writes checks, pays rent, bills goes to bank), collects and keeps track of income     Mobility   Mobility Status Independent     Written Expression   Dominant Hand Right   Handwriting --  denies change     Vision - History    Baseline Vision Wears glasses all the time   Visual History Cataracts  cataract surgery   Additional Comments reports decreased acuity since stroke. Denies diplopia, but mild blurred vision     Vision Assessment   Visual Fields --  pt reports premorbid deficits   Diplopia Assessment --  denies   Comment inattention to Lt side which is improving per pt     Cognition   Overall Cognitive Status Within Functional Limits for tasks assessed     Sensation   Light Touch Appears Intact   Additional Comments Pt reports occasional numbness Lt hand (worse on ulnar side)      Coordination   9 Hole Peg Test Right;Left   Right 9 Hole Peg Test 21.25 sec   Left 9 Hole Peg Test 35.72 sec   2 drops     Edema   Edema very mild Lt hand     ROM / Strength   AROM / PROM / Strength AROM;Strength     AROM   Overall AROM Comments RUE WNL's. LUE WFL's - lacks last 10% of end range sh. flexion and ER, but lacks strength LUE to maintain. Min compensations for high level flexion LUE     Strength   Overall Strength Comments RUE MMT grossly 4+/5. LUE MMT grossly 4/5, except ER 3+/5     Hand Function   Right Hand Grip (lbs) 60 lbs   Left Hand Grip (lbs) 45 lbs   Comment Pt also reports spilling bowl of cereal from Lt hand when walking (partly d/t strength, partly d/t decr. attention to Lt side)                          OT Education - 10/13/16 1415    Education provided Yes   Education Details OT POC, Eval results   Person(s) Educated Patient   Methods Explanation   Comprehension Verbalized understanding          OT Short Term Goals - 10/13/16 1410      OT SHORT TERM GOAL #1   Title Independent with coordination and putty HEP for Lt hand (STG's due 11/12/16)    Time 4   Period Weeks   Status New     OT SHORT TERM GOAL #2   Title Pt to be independent with strengthening HEP for LUE   Time 4   Period Weeks   Status New     OT SHORT TERM GOAL #3   Title Pt to attend  to Lt side with simple multi-tasking activity while carrying plate and/or cup of water w/o drops/spills Lt hand   Time 4   Period Weeks   Status New     OT SHORT TERM GOAL #4   Title Pt to perform high level LUE reaching to retrieve/replace 2 lb. object from high shelf with min compensations    Time 4   Period Weeks   Status New           OT Long Term Goals - 10/13/16 1413      OT LONG TERM GOAL #1   Title Improve coordination Lt hand as evidenced by reducing speed on 9 hole peg test to 28 sec. or less (LTG's due 12/12/16)   Baseline 35.72 sec. (Rt = 21.25 sec)    Time 8   Period Weeks   Status New     OT LONG TERM GOAL #2   Title Improve grip strength Lt hand to 50 lbs or greater to assist with opening jars, containers, etc   Baseline eval = 45 lbs (Rt = 60 lbs)    Time 8   Period Weeks   Status New     OT LONG TERM GOAL #3   Title Pt to retrieve/replace 5 lb object from overheadl shelf LUE 5/5 trials with no compensations   Time 8   Period Weeks   Status New               Plan - 10/13/16 1406    Clinical Impression Statement Pt is a 71 y.o. female who presents to outpatient rehab s/p CVA with mild Lt hemiparesis Lt non doimant side. 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    Occupational performance deficits (Please refer to evaluation for details): IADL's;Leisure;Other  Farm work   Designer, multimedia   OT Frequency 2x / week   OT Duration 8 weeks  May only need 6 weeks   OT Treatment/Interventions Self-care/ADL training;DME and/or AE instruction;Patient/family education;Therapeutic exercises;Therapeutic activities;Neuromuscular education;Functional Mobility Training;Passive range of motion;Visual/perceptual remediation/compensation   Plan HEP for coordination and putty LUE   Clinical Decision Making Several treatment options, min-mod  task modification necessary   Recommended Other Services Speech therapy   Consulted and Agree with Plan of Care Patient      Patient will benefit from skilled therapeutic intervention in order to improve the following deficits and impairments:  Decreased coordination, Decreased range of motion, Impaired flexibility, Improper body mechanics, Impaired sensation, Decreased endurance, Decreased activity tolerance, Impaired UE functional use, Decreased mobility, Decreased strength, Impaired perceived functional ability  Visit Diagnosis: Other lack of coordination - Plan: Ot plan of care cert/re-cert  Muscle weakness (generalized) - Plan: Ot plan of care cert/re-cert  Other disturbances of skin sensation - Plan: Ot plan of care cert/re-cert  Visuospatial deficit - Plan: Ot plan of care cert/re-cert      G-Codes - 40/97/35 1416    Functional Assessment Tool Used (Outpatient only) LUE: 9 hole peg test  = 35.72 sec., subjective report of dropping things from Lt hand, decreased high level ROM w/ shoulder compensation   Functional Limitation Carrying, moving and handling objects   Carrying, Moving and Handling Objects Current Status (H2992) At least 20 percent but less than 40 percent impaired, limited or restricted   Carrying, Moving and Handling Objects Goal Status (E2683) At least 1 percent but less than 20 percent impaired, limited  or restricted      Problem List Patient Active Problem List   Diagnosis Date Noted  . Hemiparesis affecting left side as late effect of stroke ([place]) 08/07/2016  . Osteoarthritis of right knee 08/07/2016  . Dysphagia, post-stroke 08/07/2016  . Sleep disturbance 08/07/2016  . Rash and nonspecific skin eruption 08/07/2016  . Left hemiparesis ([place])   . GERD (gastroesophageal reflux disease) 07/23/2016  . Basal ganglia infarction ([place]) 07/21/2016  . Stroke (cerebrum) ([place]) 07/19/2016  . HLD (hyperlipidemia) 08/14/2014  . Obesity (BMI 30-39.9) 08/17/2013  .  Hyperlipidemia LDL goal < 100 02/15/2013  . TIA (transient ischemic attack) 12/14/2012  . Essential hypertension 12/14/2012  . Other and unspecified hyperlipidemia 12/14/2012    Carey Bullocks, OTR/L 10/13/2016, 2:22 PM  [facility] [address] [place], [place], [address] Phone: [phone]   Fax:  [phone]  Name: ISHITHA ROPER MRN: [id number] Date of Birth: 11-07-1945

## 2016-10-14 ENCOUNTER — Encounter: Payer: Medicare Other | Admitting: Physical Medicine & Rehabilitation

## 2016-10-14 NOTE — Therapy (Signed)
[facility] [address] [place], [place], [address] Phone: [phone]   Fax:  [phone]  Physical Therapy Evaluation  Patient Details  Name: Catherine [name] MRN: [id number] Date of Birth: December 27, 1945 Referring Provider: Dr. Jaynee Eagles  Encounter Date: 10/13/2016      PT End of Session - 10/14/16 1621    Visit Number 1   Number of Visits 17   Date for PT Re-Evaluation 12/12/16   Authorization Type Medicare primary; BCBS 2nd-GCODE every 10th visit   PT Start Time 1402   PT Stop Time 1445   PT Time Calculation (min) 43 min   Activity Tolerance Patient tolerated treatment well   Behavior During Therapy [facility] for tasks assessed/performed      Past Medical History:  Diagnosis Date  . Arthritis   . Cataract   . GERD (gastroesophageal reflux disease)   . Stroke ([place])   . TIA (transient ischemic attack)     Past Surgical History:  Procedure Laterality Date  . EYE SURGERY      There were no vitals filed for this visit.       Subjective Assessment - 10/13/16 1407    Subjective Pt reports having CVA affecting L side on 07/19/16.  She feels improvement, but slower than she would like.  She feels stamina is just now beginning to build back up.  She reports normally having a fast paced walk, but her pace is slowed and L Leg "scuffles or drags" sometimes.   Pertinent History CVA 07/19/16, R TKR 2008, ?possible Leg length difference with LLE shorter? from hospitalization   Patient Stated Goals Pt's goal for therapy is to get back to normal from prior to the CVA.   Currently in Pain? Yes   Pain Score 3    Pain Location Knee  and coccyx   Pain Orientation Right   Pain Descriptors / Indicators Aching   Pain Onset More than a month ago   Pain Frequency Intermittent   Aggravating Factors  first thing in the morning, walking too long, bending down too much   Pain Relieving Factors medications, heating pad   Effect of Pain on  Daily Activities Pt will monitor, but will not address as a goal at this time, due to longstanding pain            [facility] PT Assessment - 10/13/16 1413      Assessment   Medical Diagnosis L CVA   Referring Provider Dr. Jaynee Eagles   Onset Date/Surgical Date 07/19/16  CVA     Precautions   Precautions Fall   Precaution Comments NO lifting greater than 10 lbs (per neurologist)      Balance Screen   Has the patient fallen in the past 6 months No   Has the patient had a decrease in activity level because of a fear of falling?  No   Is the patient reluctant to leave their home because of a fear of falling?  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), yardwork, gardening, singing in the choir     Sensation   Light Touch Appears Intact     Posture/Postural Control   Posture/Postural Control Postural limitations   Postural Limitations Rounded Shoulders;Forward head     ROM / Strength   AROM / PROM / Strength AROM;Strength     Strength   Strength Assessment Site Hip;Knee;Ankle   Right/Left Hip Right;Left   Right Hip Flexion 4/5   Left Hip Flexion 3+/5   Right/Left Knee Right;Left   Right Knee Flexion 5/5   Right Knee Extension 4/5   Left Knee Flexion 4/5   Left Knee Extension 3+/5   Right/Left Ankle Right;Left   Right Ankle Dorsiflexion 4/5   Left Ankle Dorsiflexion 3+/5     Transfers   Transfers Sit to Stand;Stand to Sit   Sit to Stand 6: Modified independent (Device/Increase time);With upper extremity assist;From chair/3-in-1   Stand to Sit 6: Modified independent (Device/Increase  time);With upper extremity assist;To bed     Ambulation/Gait   Ambulation/Gait Yes   Ambulation/Gait Assistance 5: Supervision   Ambulation/Gait Assistance Details Pt does not present with cane during therapy today.  With gait, pt has widened BOS, with antalgic pattern on RLE (due to increased knee pain), with decreased L step length, decreased L heelstrike, decreased stance time.     Ambulation Distance (Feet) 120 Feet  see note below   Assistive device None;Straight cane   Gait Pattern Step-through pattern;Decreased arm swing - right;Decreased arm swing - left;Decreased step length - left;Decreased stance time - left;Decreased dorsiflexion - left;Poor foot clearance - left;Antalgic   Ambulation Surface Level;Indoor   Gait velocity 17.71 sec = 1.85 ft/sec   Pre-Gait Activities Discussed pt's antalgic gait pattern, with recent exacerbation of R knee pain.  Explained it may be due to compensations of gait due to LLE weakness, with pt no longer using cane.     Gait Comments Gait training using SPC:  Pt ambulates 100 x 2, with SPC, using cane in R hand with reciprocal pattern, with improved L step length, improved L stance time, less antalgic gait pattern, with overall improved smoothness of movement.  Advised pt to return to using cane for the time being to assist with normalized gait pattern and try to reduce R knee pain.  Pt agreeable.     Standardized Balance Assessment   Standardized Balance Assessment Timed Up and Go Test;Dynamic Gait Index     Dynamic Gait Index   Level Surface Moderate Impairment   Change in Gait Speed Mild Impairment   Gait with Horizontal Head Turns Moderate Impairment   Gait with Vertical Head Turns Moderate Impairment   Gait and Pivot Turn Mild Impairment   Step Over Obstacle Moderate Impairment   Step Around Obstacles Mild Impairment   Steps Mild Impairment   Total Score 12   DGI comment: Scores <19/24 are indicative of increased fall risk.     Timed Up and Go  Test   Normal TUG (seconds) 19.1   TUG Comments Scores >13.5 indicate increased fall risk.l            Objective measurements completed on examination: See above findings.                  PT Education - 10/14/16 1620    Education provided Yes   Education Details Eval results/POC for PT; discussed fall risk with gait, antalgic gait pattern and recommended using cane for gait.   Person(s) Educated Patient  Methods Explanation;Demonstration   Comprehension Verbalized understanding;Returned demonstration          PT Short Term Goals - 10/14/16 1630      PT SHORT TERM GOAL #1   Title Pt will perform HEP independently for improved functional mobility, balance and strength.  TARGET 11/12/16   Time 4   Period Weeks   Status New     PT SHORT TERM GOAL #2   Title Pt will improve TUG score to less than or equal to 15 seconds for decreased fall risk.   Time 4   Period Weeks   Status New     PT SHORT TERM GOAL #3   Title Pt will perform at least 8 of 10 reps of sit<>stand transfers with no UE support for improved transfer efficiency and safety.   Time 4   Period Weeks   Status New     PT SHORT TERM GOAL #4   Title Pt will verbalize understanding of CVA warning signs and risk factors.   Time 4   Period Weeks   Status New           PT Long Term Goals - 10/14/16 1632      PT LONG TERM GOAL #1   Title Pt will verbalize understanding of fall prevention in the home environment.  TARGET 12/12/16   Time 8   Period Weeks   Status New     PT LONG TERM GOAL #2   Title Pt will improve TUG score to less than or equal to 13.5 seconds for decreased fall risk.   Time 8   Period Weeks   Status New     PT LONG TERM GOAL #3   Title Pt will improve Dynamic Gait Index score to at least 19/24 for decreased fall risk.   Time 8   Period Weeks   Status New     PT LONG TERM GOAL #4   Title Pt will improve gait velocity to at least 2.3 ft/sec for improved gait  efficiency and safety.   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   Time 8   Period Weeks   Status New                Plan - 10/14/16 1622    Clinical Impression Statement Pt is a 71 year old female who presents to OP PT status post CVA with L sided weakness on 07/19/16.  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  She presents to OP PT with decreased strength, decreased balance, decresaed safety and independence with gait, R knee pain.  She is at fall risk per TUG and DGI scores; she ambulates at reduced (limited community ambulator speed).  She wants to get back to running the farm, working in the garden.  She would benefit from skilled PT to address the above stated defciits to improve functional mobility and decrease fall risk.   History and Personal Factors relevant to plan of care: Pt has >3 co-morbidities (HTN, TIA, cataract, GERD), no falls, lives alone; independent and ran 10-acre farm prior to CVA   Clinical Presentation Evolving   Clinical Presentation due to:  CVA L sided weakness, independent prior to CVA, tried to return to independent ambulation no cane,  with increase in R knee pain; at fall risk per TUG and DGI   Clinical Decision Making Moderate   Rehab Potential Good   PT Frequency 2x / week   PT Duration 8 weeks  plus eval   PT Treatment/Interventions ADLs/Self Care Home Management;Gait training;DME Instruction;Neuromuscular re-education;Balance training;Therapeutic exercise;Functional mobility training;Therapeutic activities   PT Next Visit Plan Gait training with cane; work on LLE strength and balane   Recommended Other Services Speech therapy-based on pt's request (slowed speech, wants to return to singing in choir)   Consulted and Agree with Plan of Care Patient      Patient will  benefit from skilled therapeutic intervention in order to improve the following deficits and impairments:  Abnormal gait, Decreased balance, Decreased mobility, Decreased knowledge of use of DME, Difficulty walking, Decreased strength, Pain  Visit Diagnosis: Other abnormalities of gait and mobility  Unsteadiness on feet  Muscle weakness (generalized)      G-Codes - 11/11/16 1635    Functional Assessment Tool Used (Outpatient Only) gt vel 1.85 ft/sec, DGI 12/24, TUG 19.1   Functional Limitation Mobility: Walking and moving around   Mobility: Walking and Moving Around Current Status [phone]) At least 40 percent but less than 60 percent impaired, limited or restricted   Mobility: Walking and Moving Around Goal Status [phone]) At least 1 percent but less than 20 percent impaired, limited or restricted       Problem List Patient Active Problem List   Diagnosis Date Noted  . Hemiparesis affecting left side as late effect of stroke ([place]) 08/07/2016  . Osteoarthritis of right knee 08/07/2016  . Dysphagia, post-stroke 08/07/2016  . Sleep disturbance 08/07/2016  . Rash and nonspecific skin eruption 08/07/2016  . Left hemiparesis ([place])   . GERD (gastroesophageal reflux disease) 07/23/2016  . Basal ganglia infarction ([place]) 07/21/2016  . Stroke (cerebrum) ([place]) 07/19/2016  . HLD (hyperlipidemia) 08/14/2014  . Obesity (BMI 30-39.9) 08/17/2013  . Hyperlipidemia LDL goal < 100 02/15/2013  . TIA (transient ischemic attack) 12/14/2012  . Essential hypertension 12/14/2012  . Other and unspecified hyperlipidemia 12/14/2012    Catherine [name] W. November 11, 2016, 4:40 PM  Frazier Butt., PT   [facility] [address] [place], [place], [address] Phone: [phone]   Fax:  [phone]  Name: Catherine [name] MRN: [id number] Date of Birth: 1945/09/10

## 2016-10-15 ENCOUNTER — Encounter: Payer: Self-pay | Admitting: Physical Medicine & Rehabilitation

## 2016-10-15 ENCOUNTER — Encounter: Payer: Medicare Other | Attending: Physical Medicine & Rehabilitation | Admitting: Physical Medicine & Rehabilitation

## 2016-10-15 VITALS — BP 147/74 | HR 71

## 2016-10-15 DIAGNOSIS — G479 Sleep disorder, unspecified: Secondary | ICD-10-CM | POA: Diagnosis not present

## 2016-10-15 DIAGNOSIS — Z820 Family history of epilepsy and other diseases of the nervous system: Secondary | ICD-10-CM | POA: Insufficient documentation

## 2016-10-15 DIAGNOSIS — R21 Rash and other nonspecific skin eruption: Secondary | ICD-10-CM | POA: Diagnosis not present

## 2016-10-15 DIAGNOSIS — I69391 Dysphagia following cerebral infarction: Secondary | ICD-10-CM | POA: Diagnosis not present

## 2016-10-15 DIAGNOSIS — R531 Weakness: Secondary | ICD-10-CM | POA: Diagnosis not present

## 2016-10-15 DIAGNOSIS — Z9889 Other specified postprocedural states: Secondary | ICD-10-CM | POA: Diagnosis not present

## 2016-10-15 DIAGNOSIS — K219 Gastro-esophageal reflux disease without esophagitis: Secondary | ICD-10-CM | POA: Diagnosis not present

## 2016-10-15 DIAGNOSIS — I639 Cerebral infarction, unspecified: Secondary | ICD-10-CM | POA: Diagnosis not present

## 2016-10-15 DIAGNOSIS — M1711 Unilateral primary osteoarthritis, right knee: Secondary | ICD-10-CM

## 2016-10-15 DIAGNOSIS — R1310 Dysphagia, unspecified: Secondary | ICD-10-CM | POA: Insufficient documentation

## 2016-10-15 DIAGNOSIS — Z823 Family history of stroke: Secondary | ICD-10-CM | POA: Diagnosis not present

## 2016-10-15 DIAGNOSIS — Z8673 Personal history of transient ischemic attack (TIA), and cerebral infarction without residual deficits: Secondary | ICD-10-CM | POA: Diagnosis not present

## 2016-10-15 DIAGNOSIS — Z809 Family history of malignant neoplasm, unspecified: Secondary | ICD-10-CM | POA: Diagnosis not present

## 2016-10-15 DIAGNOSIS — I6381 Other cerebral infarction due to occlusion or stenosis of small artery: Secondary | ICD-10-CM

## 2016-10-15 DIAGNOSIS — I69354 Hemiplegia and hemiparesis following cerebral infarction affecting left non-dominant side: Secondary | ICD-10-CM

## 2016-10-15 DIAGNOSIS — E669 Obesity, unspecified: Secondary | ICD-10-CM | POA: Diagnosis not present

## 2016-10-15 DIAGNOSIS — G8194 Hemiplegia, unspecified affecting left nondominant side: Secondary | ICD-10-CM | POA: Insufficient documentation

## 2016-10-15 MED ORDER — DICLOFENAC SODIUM 1 % TD GEL: 2.0000 g | Freq: Four times a day (QID) | TRANSDERMAL | 1 refills | Status: DC

## 2016-10-15 NOTE — Progress Notes (Signed)
Subjective:    [name], female    DOB: 11/11/1945, 71 y.o.   MRN: [id number]  HPI 71 y.o. RH female with history of TIA 31-Jul-2012, OA , GERD presents for follow up after right basal ganglia infarct with remote left thalamic lacunar infracts.  Last clinic visit 08/07/16.  Since that time, she has completed therapies, but states she is going to resume in July.  She saw Neurology.  She continues to have speech deficits, mild swallowing difficulty. Her right knee has been causing her pain. Her nasal rash resolved without seeing Derm. She states she never saw the dietitian. Overall, she states she is doing well.  Denies falls.  Pain Inventory Average Pain 0 Pain Right Now 3 My pain is intermittent and aching  In the last 24 hours, has pain interfered with the following? General activity 6 Relation with others 10 Enjoyment of life 7 What TIME of day is your pain at its worst? na Sleep (in general) Good  Pain is worse with: unsure Pain improves with: rest Relief from Meds: 6  Mobility use a cane use a walker ability to climb steps?  yes do you drive?  no  Function not employed: date last employed . I need assistance with the following:  bathing, meal prep, household duties and shopping  Neuro/Psych weakness trouble walking loss of taste or smell  Prior Studies Any changes since last visit?  no  Physicians involved in your care Primary care dr. Nancy Fetter   Family History  Problem Relation Age of Onset  . Dementia Mother   . Cancer Mother   . Stroke Mother   . Cancer Father    Social History   Social History  . Marital status: Widowed    Spouse name: N/A  . Number of children: 3  . Years of education: College   Occupational History  . Retired    Social History Main Topics  . Smoking status: Never Smoker  . Smokeless tobacco: Never Used  . Alcohol use No  . Drug use: No  . Sexual activity: Yes   Other Topics Concern  . None   Social History  Narrative   Husband passed away in 2013/07/31.   Now lives w/ her daughter.   Past Surgical History:  Procedure Laterality Date  . EYE SURGERY     Past Medical History:  Diagnosis Date  . Arthritis   . Cataract   . GERD (gastroesophageal reflux disease)   . Stroke ([place])   . TIA (transient ischemic attack)    BP (!) 147/74   Pulse 71   SpO2 98%   Opioid Risk Score:   Fall Risk Score:  `1  Depression screen PHQ 2/9  No flowsheet data found.  Review of Systems  Constitutional: Negative.   HENT: Negative.   Eyes: Negative.   Respiratory: Negative.   Cardiovascular: Negative.   Gastrointestinal: Negative.   Endocrine: Negative.   Genitourinary: Negative.   Musculoskeletal: Negative.   Skin: Negative.   Allergic/Immunologic: Negative.   Neurological: Positive for speech difficulty.       Dysesthesias  Hematological: Bruises/bleeds easily.  Psychiatric/Behavioral: Negative.   All other systems reviewed and are negative.     Objective:   Physical Exam  Gen NAD. Vital signs reviewed.  HEENT: Normocephalic, atraumatic. Cardio: RRR and no murmur Resp: CTA B/L and Unlabored GI: BS positive and NT Musc/Skel:  No edema. No tenderness Gait: antalgic Neuro: Alert/Oriented x3 Motor 5/5 in RUE and  RLE LUE: 4+/5 LLE: HF 4/5, KE, ADF/PF 4+/5  Mild Dysarthria Left facial droop No increase in tone Skin:   Warm and dry. Intact. Rash on right nasolabial fold    Assessment & Plan:  71 y.o. RH female with history of TIA 2014, OA , GERD presents for follow up after right basal ganglia infarct with remote left thalamic lacunar infracts.  1. Left hemiparesis and functional deficits secondary to right basal ganglia infarct  Resume therapies  Cont follow up with Neurology  Cont meds  2. Right knee OA s/p TKA in 2008  Cont conservative measures  Voltaren gel ordered  Avoid oral NSAIDs at present  3. Dysphagia   Cont therapies  Encouraged small bites and limit  distraction  4. Sleep disturbance  Improving  Encouraged sleep hygiene  5. Diet/Obesity  Referral made to dietitian due to numerous questions regarding supplements and improved diet, but pt did not follow up and states she will consider again in future  6. Gait abnormality  Cont cane for safety  Cont therapies

## 2016-10-27 ENCOUNTER — Other Ambulatory Visit: Payer: Self-pay | Admitting: Physical Medicine and Rehabilitation

## 2016-10-30 DIAGNOSIS — H26491 Other secondary cataract, right eye: Secondary | ICD-10-CM | POA: Diagnosis not present

## 2016-10-30 DIAGNOSIS — Z961 Presence of intraocular lens: Secondary | ICD-10-CM | POA: Diagnosis not present

## 2016-10-30 DIAGNOSIS — Z8673 Personal history of transient ischemic attack (TIA), and cerebral infarction without residual deficits: Secondary | ICD-10-CM | POA: Diagnosis not present

## 2016-11-02 ENCOUNTER — Ambulatory Visit: Payer: Medicare Other | Admitting: Occupational Therapy

## 2016-11-02 ENCOUNTER — Ambulatory Visit: Payer: Medicare Other | Attending: Neurology | Admitting: Physical Therapy

## 2016-11-02 VITALS — BP 127/77 | HR 83

## 2016-11-02 DIAGNOSIS — R2681 Unsteadiness on feet: Secondary | ICD-10-CM | POA: Diagnosis not present

## 2016-11-02 DIAGNOSIS — R278 Other lack of coordination: Secondary | ICD-10-CM

## 2016-11-02 DIAGNOSIS — R2689 Other abnormalities of gait and mobility: Secondary | ICD-10-CM | POA: Diagnosis not present

## 2016-11-02 DIAGNOSIS — R208 Other disturbances of skin sensation: Secondary | ICD-10-CM | POA: Insufficient documentation

## 2016-11-02 DIAGNOSIS — M6281 Muscle weakness (generalized): Secondary | ICD-10-CM | POA: Insufficient documentation

## 2016-11-02 DIAGNOSIS — R471 Dysarthria and anarthria: Secondary | ICD-10-CM | POA: Diagnosis not present

## 2016-11-02 DIAGNOSIS — R41842 Visuospatial deficit: Secondary | ICD-10-CM | POA: Insufficient documentation

## 2016-11-02 DIAGNOSIS — R41841 Cognitive communication deficit: Secondary | ICD-10-CM | POA: Insufficient documentation

## 2016-11-02 NOTE — Patient Instructions (Addendum)
  Coordination Activities  Perform the following activities for 20 minutes 1 times per day with left hand(s).   Rotate ball in fingertips (clockwise and counter-clockwise).  Toss ball between hands.  Toss ball in air and catch with the same hand.  Flip cards 1 at a time as fast as you can.  Deal cards with your thumb (Hold deck in hand and push card off top with thumb).  Rotate card in hand (clockwise and counter-clockwise).  Shuffle cards.  Pick up coins and stack.  Pick up coins one at a time until you get 5-10 in your hand, then move coins from palm to fingertips to place in container or coin bank one at a time.  Squeeze putty with your whole hand 20 times   Roll putty out and then pinch with each finger/thumb down length of putty x3

## 2016-11-02 NOTE — Therapy (Signed)
[facility] [address] [place], [place], [address] Phone: [phone]   Fax:  [phone]  Physical Therapy Treatment  Patient Details  Name: Catherine [name] MRN: [id number] Date of Birth: November 22, 1945 Referring Provider: Dr. Jaynee Eagles  Encounter Date: 11/02/2016      PT End of Session - 11/02/16 1957    Visit Number 2   Number of Visits 17   Date for PT Re-Evaluation 12/12/16   Authorization Type Medicare primary; BCBS 2nd-GCODE every 10th visit   PT Start Time 1320   PT Stop Time 1400   PT Time Calculation (min) 40 min   Activity Tolerance --  Pt limited by c/o dizziness during standing activities; vital signs WNL   Behavior During Therapy [facility] for tasks assessed/performed      Past Medical History:  Diagnosis Date  . Arthritis   . Cataract   . GERD (gastroesophageal reflux disease)   . Stroke ([place])   . TIA (transient ischemic attack)     Past Surgical History:  Procedure Laterality Date  . EYE SURGERY      Vitals:   11/02/16 1321  BP: 127/77  Pulse: 83        Subjective Assessment - 11/02/16 1321    Subjective Reports using the cane on the days where her back hurts or knee hurts more.  On good days like today, she doesn't feel that she needs her cane.   Pertinent History CVA 07/19/16, R TKR 2008, ?possible Leg length difference with LLE shorter? from hospitalization   Patient Stated Goals Pt's goal for therapy is to get back to normal from prior to the CVA.   Currently in Pain? Yes   Pain Score 2    Pain Location --  tailbone   Pain Descriptors / Indicators Aching   Pain Type Chronic pain   Pain Onset More than a month ago   Pain Frequency Constant   Aggravating Factors  nothing   Pain Relieving Factors nothing   Effect of Pain on Daily Activities PT will monitor, but will not address as a goal at this time, due to pain longstanding in nature.                         [facility]  Adult PT Treatment/Exercise - 11/02/16 1331      High Level Balance   High Level Balance Activities Side stepping;Braiding  Along counter.  Pt has difficulty with braiding sequence.   High Level Balance Comments At counter:  Alternating hip abduction with 2-3 second hold; alternating hip extension with 2-3 second hold.  Cues provided for both for upright posture.     Self-Care   Self-Care Other Self-Care Comments   Other Self-Care Comments  After braiding activity, pt c/o dizziness.  Pt sat down and BP was taken (see vital).  Blood pressure and HR appeared within normal limits, but pt did report several headaches recently and several occasions of dizziness.  Discussed CVA warning signs and symptoms, risk factors associated with CVA.  Discussed fatigue following CVA, as pt reports she may be trying to get back to doing too much too fast.  Discussed energy conservation and prioritizing activities, asking for help from family members,a nd trying to take rest breaks more throughout the day to avoid over-fatiguing herself with activities.  Advised her to check her blood pressure regularly at home and contact physician if increase in BP noted, to call 911 if any  immedicate CVA symptoms/warning signs occur.                  PT Education - 11/02/16 1956    Education provided Yes   Education Details See self care-provided information on CVA warning signs and symptoms, fatigue following CVA   Person(s) Educated Patient   Methods Explanation;Handout   Comprehension Verbalized understanding          PT Short Term Goals - 10/14/16 1630      PT SHORT TERM GOAL #1   Title Pt will perform HEP independently for improved functional mobility, balance and strength.  TARGET 11/12/16   Time 4   Period Weeks   Status New     PT SHORT TERM GOAL #2   Title Pt will improve TUG score to less than or equal to 15 seconds for decreased fall risk.   Time 4   Period Weeks   Status New     PT SHORT TERM  GOAL #3   Title Pt will perform at least 8 of 10 reps of sit<>stand transfers with no UE support for improved transfer efficiency and safety.   Time 4   Period Weeks   Status New     PT SHORT TERM GOAL #4   Title Pt will verbalize understanding of CVA warning signs and risk factors.   Time 4   Period Weeks   Status New           PT Long Term Goals - 10/14/16 1632      PT LONG TERM GOAL #1   Title Pt will verbalize understanding of fall prevention in the home environment.  TARGET 12/12/16   Time 8   Period Weeks   Status New     PT LONG TERM GOAL #2   Title Pt will improve TUG score to less than or equal to 13.5 seconds for decreased fall risk.   Time 8   Period Weeks   Status New     PT LONG TERM GOAL #3   Title Pt will improve Dynamic Gait Index score to at least 19/24 for decreased fall risk.   Time 8   Period Weeks   Status New     PT LONG TERM GOAL #4   Title Pt will improve gait velocity to at least 2.3 ft/sec for improved gait efficiency and safety.   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   Time 8   Period Weeks   Status New               Plan - 11/02/16 1959    Clinical Impression Statement Pt returns to PT today for first visit after evaluation.  Began session by addressing standing balance activities at counter.  Upon pt's c/o dizziness/imbalance with braiding activity, and vital signs taken WNL, spent rest of session educating patient on CVA warning signs, risk factors, fatigue after CVA.  Pt questions if stress and fatigue could be factors in her feeling this way, with education provided on energy conservation as well.  Will continue to work  on balance and LLE strengthening activities as well as dynamic gait.   Rehab Potential Good   PT Frequency 2x / week   PT Duration 8 weeks  plus eval  PT Treatment/Interventions ADLs/Self Care Home Management;Gait training;DME Instruction;Neuromuscular re-education;Balance training;Therapeutic exercise;Functional mobility training;Therapeutic activities   PT Next Visit Plan Gait training with cane; work on LLE strength and balance/compliant surface activities to add to HEP   Consulted and Agree with Plan of Care Patient      Patient will benefit from skilled therapeutic intervention in order to improve the following deficits and impairments:  Abnormal gait, Decreased balance, Decreased mobility, Decreased knowledge of use of DME, Difficulty walking, Decreased strength, Pain  Visit Diagnosis: Unsteadiness on feet     Problem List Patient Active Problem List   Diagnosis Date Noted  . Hemiparesis affecting left side as late effect of stroke ([place]) 08/07/2016  . Osteoarthritis of right knee 08/07/2016  . Dysphagia, post-stroke 08/07/2016  . Sleep disturbance 08/07/2016  . Rash and nonspecific skin eruption 08/07/2016  . Left hemiparesis ([place])   . GERD (gastroesophageal reflux disease) 07/23/2016  . Basal ganglia infarction ([place]) 07/21/2016  . Stroke (cerebrum) ([place]) 07/19/2016  . HLD (hyperlipidemia) 08/14/2014  . Obesity (BMI 30-39.9) 08/17/2013  . Hyperlipidemia LDL goal < 100 02/15/2013  . TIA (transient ischemic attack) 12/14/2012  . Essential hypertension 12/14/2012  . Other and unspecified hyperlipidemia 12/14/2012    Jaymason Ledesma W. 11/02/2016, 8:07 PM  Frazier Butt., PT   [facility] [address] [place], [place], [address] Phone: [phone]   Fax:  [phone]  Name: Catherine [name] MRN: [id number] Date of Birth: Sep 11, 1945

## 2016-11-02 NOTE — Therapy (Signed)
[facility] [address] [place], [place], [address] Phone: [phone]   Fax:  [phone]  Occupational Therapy Treatment  Patient Details  Name: Catherine [name] MRN: [id number] Date of Birth: 02-Dec-1945 Referring Provider: Dr. Sarina Ill  Encounter Date: 11/02/2016      OT End of Session - 11/02/16 1109    Visit Number 2   Number of Visits 17   Date for OT Re-Evaluation 12/12/16   Authorization Type MCR/BCBS - G code needed   Authorization - Visit Number 2   Authorization - Number of Visits 10   OT Start Time 1105   OT Stop Time 1145   OT Time Calculation (min) 40 min   Activity Tolerance Patient tolerated treatment well   Behavior During Therapy [facility] for tasks assessed/performed      Past Medical History:  Diagnosis Date  . Arthritis   . Cataract   . GERD (gastroesophageal reflux disease)   . Stroke ([place])   . TIA (transient ischemic attack)     Past Surgical History:  Procedure Laterality Date  . EYE SURGERY      There were no vitals filed for this visit.      Subjective Assessment - 11/02/16 1108    Subjective  reports incr LUE numbness yesterday and today; will undergo testing for pitutary gland   Pertinent History 07/19/16: CVA.  PMH: TIA 2014, OA, GERD, Rt TKR 2008   Limitations No lifting > 10 lbs   Patient Stated Goals improve fine motor skills   Currently in Pain? Yes   Pain Score 2    Pain Location --  "tailbone"   Pain Descriptors / Indicators Aching   Pain Type Chronic pain   Pain Onset More than a month ago   Pain Frequency Constant   Aggravating Factors  nothing   Pain Relieving Factors nothing                              OT Education - 11/02/16 1118    Education Details Coordination/Red Putty HEP--see pt instructions   Person(s) Educated Patient   Methods Explanation;Demonstration;Handout;Verbal cues   Comprehension Verbalized  understanding;Returned demonstration;Verbal cues required          OT Short Term Goals - 10/13/16 1410      OT SHORT TERM GOAL #1   Title Independent with coordination and putty HEP for Lt hand (STG's due 11/12/16)    Time 4   Period Weeks   Status New     OT SHORT TERM GOAL #2   Title Pt to be independent with strengthening HEP for LUE   Time 4   Period Weeks   Status New     OT SHORT TERM GOAL #3   Title Pt to attend to Lt side with simple multi-tasking activity while carrying plate and/or cup of water w/o drops/spills Lt hand   Time 4   Period Weeks   Status New     OT SHORT TERM GOAL #4   Title Pt to perform high level LUE reaching to retrieve/replace 2 lb. object from high shelf with min compensations    Time 4   Period Weeks   Status New           OT Long Term Goals - 10/13/16 1413      OT LONG TERM GOAL #1   Title Improve coordination Lt hand as evidenced by reducing speed  on 9 hole peg test to 28 sec. or less (LTG's due 12/12/16)   Baseline 35.72 sec. (Rt = 21.25 sec)    Time 8   Period Weeks   Status New     OT LONG TERM GOAL #2   Title Improve grip strength Lt hand to 50 lbs or greater to assist with opening jars, containers, etc   Baseline eval = 45 lbs (Rt = 60 lbs)    Time 8   Period Weeks   Status New     OT LONG TERM GOAL #3   Title Pt to retrieve/replace 5 lb object from overheadl shelf LUE 5/5 trials with no compensations   Time 8   Period Weeks   Status New               Plan - 11/02/16 1116    Clinical Impression Statement Pt verbalized understanding of initial HEP for coordination and hand strength and is progressing towards goals.   Rehab Potential Good   OT Frequency 2x / week   OT Duration 8 weeks  May only need 6 weeks   OT Treatment/Interventions Self-care/ADL training;DME and/or AE instruction;Patient/family education;Therapeutic exercises;Therapeutic activities;Neuromuscular education;Functional Mobility  Training;Passive range of motion;Visual/perceptual remediation/compensation   Plan strengthening HEP L shoulder   OT Home Exercise Plan Education provided:  Red putty/Coordination HEP   Consulted and Agree with Plan of Care Patient      Patient will benefit from skilled therapeutic intervention in order to improve the following deficits and impairments:  Decreased coordination, Decreased range of motion, Impaired flexibility, Improper body mechanics, Impaired sensation, Decreased endurance, Decreased activity tolerance, Impaired UE functional use, Decreased mobility, Decreased strength, Impaired perceived functional ability  Visit Diagnosis: Muscle weakness (generalized)  Other lack of coordination  Other disturbances of skin sensation  Visuospatial deficit    Problem List Patient Active Problem List   Diagnosis Date Noted  . Hemiparesis affecting left side as late effect of stroke ([place]) 08/07/2016  . Osteoarthritis of right knee 08/07/2016  . Dysphagia, post-stroke 08/07/2016  . Sleep disturbance 08/07/2016  . Rash and nonspecific skin eruption 08/07/2016  . Left hemiparesis ([place])   . GERD (gastroesophageal reflux disease) 07/23/2016  . Basal ganglia infarction ([place]) 07/21/2016  . Stroke (cerebrum) ([place]) 07/19/2016  . HLD (hyperlipidemia) 08/14/2014  . Obesity (BMI 30-39.9) 08/17/2013  . Hyperlipidemia LDL goal < 100 02/15/2013  . TIA (transient ischemic attack) 12/14/2012  . Essential hypertension 12/14/2012  . Other and unspecified hyperlipidemia 12/14/2012    [facility] 11/02/2016, 11:59 AM  [facility] [address] [place], [place], [address] Phone: [phone]   Fax:  [phone]  Name: JENAVIEVE FREDA MRN: [id number] Date of Birth: March 17, 1946   Vianne Bulls, OTR/L [facility] [address] [place], [place]  [address] [phone] phone [phone] 11/02/16  11:59 AM

## 2016-11-04 ENCOUNTER — Ambulatory Visit: Payer: Medicare Other | Admitting: Occupational Therapy

## 2016-11-04 ENCOUNTER — Ambulatory Visit: Payer: Medicare Other | Admitting: Physical Therapy

## 2016-11-04 DIAGNOSIS — R278 Other lack of coordination: Secondary | ICD-10-CM | POA: Diagnosis not present

## 2016-11-04 DIAGNOSIS — R471 Dysarthria and anarthria: Secondary | ICD-10-CM | POA: Diagnosis not present

## 2016-11-04 DIAGNOSIS — R41841 Cognitive communication deficit: Secondary | ICD-10-CM | POA: Diagnosis not present

## 2016-11-04 DIAGNOSIS — R2681 Unsteadiness on feet: Secondary | ICD-10-CM

## 2016-11-04 DIAGNOSIS — R2689 Other abnormalities of gait and mobility: Secondary | ICD-10-CM

## 2016-11-04 DIAGNOSIS — M6281 Muscle weakness (generalized): Secondary | ICD-10-CM | POA: Diagnosis not present

## 2016-11-04 NOTE — Therapy (Signed)
[facility] [address] [place], [place], [address] Phone: [phone]   Fax:  [phone]  Occupational Therapy Treatment  Patient Details  Name: Catherine [name] MRN: [id number] Date of Birth: Mar 21, 1946 Referring Provider: Dr. Sarina Ill  Encounter Date: 11/04/2016      OT End of Session - 11/04/16 1016    Visit Number 3   Number of Visits 17   Date for OT Re-Evaluation 12/12/16   Authorization Type MCR/BCBS - G code needed   Authorization - Visit Number 3   Authorization - Number of Visits 10   OT Start Time 0932   OT Stop Time 1015   OT Time Calculation (min) 43 min   Activity Tolerance Patient tolerated treatment well   Behavior During Therapy [facility] for tasks assessed/performed      Past Medical History:  Diagnosis Date  . Arthritis   . Cataract   . GERD (gastroesophageal reflux disease)   . Stroke ([place])   . TIA (transient ischemic attack)     Past Surgical History:  Procedure Laterality Date  . EYE SURGERY      There were no vitals filed for this visit.      Subjective Assessment - 11/04/16 0948    Subjective  My coordination and sensation is taking awhile to get back   Pertinent History 07/19/16: CVA.  PMH: TIA 2014, OA, GERD, Rt TKR 2008   Limitations No lifting > 10 lbs   Patient Stated Goals improve fine motor skills   Currently in Pain? Yes   Pain Score 3    Pain Location Back   Pain Orientation Lower;Left   Pain Descriptors / Indicators Aching   Pain Type Chronic pain   Pain Onset More than a month ago   Pain Frequency Intermittent   Aggravating Factors  most mornings   Pain Relieving Factors exercises, meds                      OT Treatments/Exercises (OP) - 11/04/16 0001      Exercises   Exercises Shoulder     Shoulder Exercises: ROM/Strengthening   Other ROM/Strengthening Exercises Issued theraband HEP for LUE - Pt needed mod cueing for proper positioning  but got better after repetition. Could benefit from review/reinforcement     Fine Motor Coordination   Other Fine Motor Exercises Reviewed coordination HEP - pt demo each. with difficulty. Noted gross motor coordination deficits as well with multiple drops of ball LUE                OT Education - 11/04/16 1012    Education provided Yes   Education Details Strengthening HEP for LUE, review of coordination HEP   Person(s) Educated Patient   Methods Explanation;Demonstration;Handout;Verbal cues   Comprehension Verbalized understanding;Returned demonstration;Verbal cues required;Need further instruction          OT Short Term Goals - 11/04/16 1017      OT SHORT TERM GOAL #1   Title Independent with coordination and putty HEP for Lt hand (STG's due 11/12/16)    Time 4   Period Weeks   Status Achieved     OT SHORT TERM GOAL #2   Title Pt to be independent with strengthening HEP for LUE   Time 4   Period Weeks   Status On-going     OT SHORT TERM GOAL #3   Title Pt to attend to Lt side with simple multi-tasking activity while  carrying plate and/or cup of water w/o drops/spills Lt hand   Time 4   Period Weeks   Status New     OT SHORT TERM GOAL #4   Title Pt to perform high level LUE reaching to retrieve/replace 2 lb. object from high shelf with min compensations    Time 4   Period Weeks   Status New           OT Long Term Goals - 10/13/16 1413      OT LONG TERM GOAL #1   Title Improve coordination Lt hand as evidenced by reducing speed on 9 hole peg test to 28 sec. or less (LTG's due 12/12/16)   Baseline 35.72 sec. (Rt = 21.25 sec)    Time 8   Period Weeks   Status New     OT LONG TERM GOAL #2   Title Improve grip strength Lt hand to 50 lbs or greater to assist with opening jars, containers, etc   Baseline eval = 45 lbs (Rt = 60 lbs)    Time 8   Period Weeks   Status New     OT LONG TERM GOAL #3   Title Pt to retrieve/replace 5 lb object from overheadl  shelf LUE 5/5 trials with no compensations   Time 8   Period Weeks   Status New               Plan - 11/04/16 1250    Clinical Impression Statement Pt needs reinforcement of strengthening HEP for LUE. Pt noted to have gross motor coordination deficits and apraxia as well.    Rehab Potential Good   OT Frequency 2x / week   OT Duration 8 weeks  may only need 6 weeks   OT Treatment/Interventions Self-care/ADL training;DME and/or AE instruction;Patient/family education;Therapeutic exercises;Therapeutic activities;Neuromuscular education;Functional Mobility Training;Passive range of motion;Visual/perceptual remediation/compensation   Plan review strengthening HEP, gross motor coordination, attention to Lt side   OT Home Exercise Plan Education provided:  Red putty/Coordination HEP, strengthening HEP LUE   Consulted and Agree with Plan of Care Patient      Patient will benefit from skilled therapeutic intervention in order to improve the following deficits and impairments:  Decreased coordination, Decreased range of motion, Impaired flexibility, Improper body mechanics, Impaired sensation, Decreased endurance, Decreased activity tolerance, Impaired UE functional use, Decreased mobility, Decreased strength, Impaired perceived functional ability  Visit Diagnosis: Other lack of coordination  Muscle weakness (generalized)    Problem List Patient Active Problem List   Diagnosis Date Noted  . Hemiparesis affecting left side as late effect of stroke ([place]) 08/07/2016  . Osteoarthritis of right knee 08/07/2016  . Dysphagia, post-stroke 08/07/2016  . Sleep disturbance 08/07/2016  . Rash and nonspecific skin eruption 08/07/2016  . Left hemiparesis ([place])   . GERD (gastroesophageal reflux disease) 07/23/2016  . Basal ganglia infarction ([place]) 07/21/2016  . Stroke (cerebrum) ([place]) 07/19/2016  . HLD (hyperlipidemia) 08/14/2014  . Obesity (BMI 30-39.9) 08/17/2013  . Hyperlipidemia LDL goal  < 100 02/15/2013  . TIA (transient ischemic attack) 12/14/2012  . Essential hypertension 12/14/2012  . Other and unspecified hyperlipidemia 12/14/2012    Catherine [name], OTR/L 11/04/2016, 12:52 PM  [facility] [address] [place], [place], [address] Phone: [phone]   Fax:  [phone]  Name: Catherine [name] MRN: [id number] Date of Birth: 10-07-1945

## 2016-11-04 NOTE — Therapy (Signed)
[facility] [address] [place], [place], [address] Phone: [phone]   Fax:  [phone]  Physical Therapy Treatment  Patient Details  Name: Catherine [name] MRN: [id number] Date of Birth: May 14, 1945 Referring Provider: Dr. Jaynee Eagles  Encounter Date: 11/04/2016      PT End of Session - 11/04/16 2100    Visit Number 3   Number of Visits 17   Date for PT Re-Evaluation 12/12/16   Authorization Type Medicare primary; BCBS 2nd-GCODE every 10th visit   PT Start Time 0853   PT Stop Time 0932   PT Time Calculation (min) 39 min   Activity Tolerance Patient tolerated treatment well   Behavior During Therapy [facility] for tasks assessed/performed      Past Medical History:  Diagnosis Date  . Arthritis   . Cataract   . GERD (gastroesophageal reflux disease)   . Stroke ([place])   . TIA (transient ischemic attack)     Past Surgical History:  Procedure Laterality Date  . EYE SURGERY      There were no vitals filed for this visit.      Subjective Assessment - 11/04/16 0854    Subjective Still busy around the house, but I did rest some yesterday.  Brought the cane today just in case.   Pertinent History CVA 07/19/16, R TKR 2008, ?possible Leg length difference with LLE shorter? from hospitalization   Patient Stated Goals Pt's goal for therapy is to get back to normal from prior to the CVA.   Currently in Pain? Yes   Pain Score 3    Pain Location Back   Pain Orientation Lower   Pain Descriptors / Indicators Aching   Pain Type Chronic pain   Pain Onset More than a month ago   Pain Frequency Intermittent   Aggravating Factors  not sure, but have it most mornings   Pain Relieving Factors medication-ibuprofen                         [facility] Adult PT Treatment/Exercise - 11/04/16 0001      Ambulation/Gait   Ambulation/Gait Yes   Ambulation/Gait Assistance 5: Supervision   Ambulation/Gait Assistance Details Pt  appears more upright, decreased Trendelenburg/antalgic-type gait pattern with cane.  Cues provided for equal step length and tactile cues provided for increased L arm swing with cane.   Ambulation Distance (Feet) 475 Feet  40 ft x 2, then 120 ft with no cane   Assistive device None;Straight cane   Gait Pattern Step-through pattern;Decreased arm swing - right;Decreased arm swing - left;Decreased step length - left;Decreased stance time - left;Decreased dorsiflexion - left;Antalgic   Ambulation Surface Level;Indoor   Gait Comments Beside parallel bars-forward/back walking 4 reps x 15 ft (pt reports doing tandem forward and back gait from HHPT); this PT provides cues for patient to focus on equal step length, forward/back with widened BOS for improved balance/stability.     Exercises   Exercises Knee/Hip     Knee/Hip Exercises: Stretches   Active Hamstring Stretch Right;Left;3 reps;30 seconds   Active Hamstring Stretch Limitations Seated position; pt c/o pulling slightly in low back with excess forward lean.  Educated on optimal technique.             Balance Exercises - 11/04/16 0905      Balance Exercises: Standing   Rockerboard Anterior/posterior;Head turns;EO  Head nods x 5 reps; hip/ankle strategy work x 20 reps   Balance Beam  marching in place x 10 reps, alternating forward kicks x 10 reps, alternating forward step taps x 10 reps   Heel Raises Limitations LLE only, 2 sets x 10   Other Standing Exercises STagger stance rocking forward and back for dynamic weightshfit and ankle dorsiflexion on LLE x 20 reps; on rockerboard:  balance in center with alternating UE lifts x 10 reps           PT Education - 11/04/16 2059    Education provided Yes   Education Details HEP additions-backward/forward walking; seated hamstring stretch; use of cane indoor and outdoor surfaces for optimal gait pattern   Person(s) Educated Patient   Methods Explanation;Demonstration;Handout    Comprehension Verbalized understanding;Returned demonstration;Verbal cues required          PT Short Term Goals - 10/14/16 1630      PT SHORT TERM GOAL #1   Title Pt will perform HEP independently for improved functional mobility, balance and strength.  TARGET 11/12/16   Time 4   Period Weeks   Status New     PT SHORT TERM GOAL #2   Title Pt will improve TUG score to less than or equal to 15 seconds for decreased fall risk.   Time 4   Period Weeks   Status New     PT SHORT TERM GOAL #3   Title Pt will perform at least 8 of 10 reps of sit<>stand transfers with no UE support for improved transfer efficiency and safety.   Time 4   Period Weeks   Status New     PT SHORT TERM GOAL #4   Title Pt will verbalize understanding of CVA warning signs and risk factors.   Time 4   Period Weeks   Status New           PT Long Term Goals - 10/14/16 1632      PT LONG TERM GOAL #1   Title Pt will verbalize understanding of fall prevention in the home environment.  TARGET 12/12/16   Time 8   Period Weeks   Status New     PT LONG TERM GOAL #2   Title Pt will improve TUG score to less than or equal to 13.5 seconds for decreased fall risk.   Time 8   Period Weeks   Status New     PT LONG TERM GOAL #3   Title Pt will improve Dynamic Gait Index score to at least 19/24 for decreased fall risk.   Time 8   Period Weeks   Status New     PT LONG TERM GOAL #4   Title Pt will improve gait velocity to at least 2.3 ft/sec for improved gait efficiency and safety.   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   Time 8   Period Weeks   Status New               Plan - 11/04/16 2101    Clinical Impression Statement Pt tolerates PT session well today, with no c/o dizziness, addressing gait training and compliant surface balance  activities.  Focused on gait training both without gait, including gait mechanics for increased awareness of optimal gait pattern.  Recommended patient use cane at this time for gait, in order to slow gait  pace, increased L step length and increased L stance time.  Pt will continue to benefit from further skilled PT to address balance, gait and strength.   Rehab Potential Good   PT Frequency 2x / week   PT Duration 8 weeks  plus eval   PT Treatment/Interventions ADLs/Self Care Home Management;Gait training;DME Instruction;Neuromuscular re-education;Balance training;Therapeutic exercise;Functional mobility training;Therapeutic activities   PT Next Visit Plan Continue gait training with and without cane; LLE strength and stretching; balance compliant surfaces; may need to look at low back stretch (due to pt's c/o pulling in low back with seated hamstring stretch)   Consulted and Agree with Plan of Care Patient      Patient will benefit from skilled therapeutic intervention in order to improve the following deficits and impairments:  Abnormal gait, Decreased balance, Decreased mobility, Decreased knowledge of use of DME, Difficulty walking, Decreased strength, Pain  Visit Diagnosis: Other abnormalities of gait and mobility  Unsteadiness on feet     Problem List Patient Active Problem List   Diagnosis Date Noted  . Hemiparesis affecting left side as late effect of stroke ([place]) 08/07/2016  . Osteoarthritis of right knee 08/07/2016  . Dysphagia, post-stroke 08/07/2016  . Sleep disturbance 08/07/2016  . Rash and nonspecific skin eruption 08/07/2016  . Left hemiparesis ([place])   . GERD (gastroesophageal reflux disease) 07/23/2016  . Basal ganglia infarction ([place]) 07/21/2016  . Stroke (cerebrum) ([place]) 07/19/2016  . HLD (hyperlipidemia) 08/14/2014  . Obesity (BMI 30-39.9) 08/17/2013  . Hyperlipidemia LDL goal < 100 02/15/2013  . TIA (transient ischemic attack) 12/14/2012  . Essential  hypertension 12/14/2012  . Other and unspecified hyperlipidemia 12/14/2012    Onell Mcmath W. 11/04/2016, 9:06 PM Frazier Butt., PT  [facility] [address] [place], [place], [address] Phone: [phone]   Fax:  [phone]  Name: Catherine [name] MRN: [id number] Date of Birth: 1945-07-19

## 2016-11-04 NOTE — Patient Instructions (Addendum)
HIP: Hamstrings - Short Sitting    Rest leg on raised surface or the floor. Keep knee straight. Lift chest and lean forward until you feel a gentle stretch. Hold _30__ seconds. __3_ reps per set, _1-2__ sets per day.  Copyright  VHI. All rights reserved.  Backward    Walk backwards with eyes open. Take even steps, making sure each foot lifts off floor. Repeat for _10-15 ft___  , then forward walking 10-15 ft.  Repeat 4-5 times. Do __1-2__ sessions per day.   Copyright  VHI. All rights reserved.

## 2016-11-04 NOTE — Patient Instructions (Signed)
   Strengthening: Resisted Flexion   Hold tubing with __LT___ arm(s) at side. Pull forward and up. Move shoulder through pain-free range of motion. Arm STRAIGHT Repeat __10__ times per set.  Do _2_ sessions per day , every other day   Strengthening: Resisted Extension   Hold tubing in __LT___ hand(s), arm forward. Pull arm back, elbow straight. Repeat _10___ times per set. Do _2___ sessions per day, every other day.   Resisted Horizontal Abduction: Bilateral   Sit or stand, tubing in both hands, arms out in front. Keeping arms straight, pinch shoulder blades together and stretch arms out. Pull apart EVENLY Repeat _10___ times per set. Do _2___ sessions per day, every other day.   SELF ASSISTED WITH OBJECT: Shoulder Flexion - Sitting    Hold 2 LB. weight with both hands with palms facing each other. Raise arms up to head level, controlled with Lt elbow straight. _10__ reps per set, _2__ sets per day, every other day

## 2016-11-09 ENCOUNTER — Other Ambulatory Visit: Payer: Self-pay | Admitting: Neurology

## 2016-11-09 ENCOUNTER — Ambulatory Visit: Payer: Medicare Other | Admitting: Physical Therapy

## 2016-11-09 ENCOUNTER — Ambulatory Visit: Payer: Medicare Other | Admitting: Occupational Therapy

## 2016-11-09 ENCOUNTER — Telehealth: Payer: Self-pay | Admitting: Occupational Therapy

## 2016-11-09 DIAGNOSIS — M6281 Muscle weakness (generalized): Secondary | ICD-10-CM

## 2016-11-09 DIAGNOSIS — R2689 Other abnormalities of gait and mobility: Secondary | ICD-10-CM | POA: Diagnosis not present

## 2016-11-09 DIAGNOSIS — I639 Cerebral infarction, unspecified: Secondary | ICD-10-CM

## 2016-11-09 DIAGNOSIS — R2681 Unsteadiness on feet: Secondary | ICD-10-CM

## 2016-11-09 DIAGNOSIS — R471 Dysarthria and anarthria: Secondary | ICD-10-CM | POA: Diagnosis not present

## 2016-11-09 DIAGNOSIS — R41841 Cognitive communication deficit: Secondary | ICD-10-CM | POA: Diagnosis not present

## 2016-11-09 DIAGNOSIS — R278 Other lack of coordination: Secondary | ICD-10-CM

## 2016-11-09 NOTE — Telephone Encounter (Signed)
Please send referral for speech therapy services to outpatient neuro rehabilitation services d/t patient's difficulty with slurred speech, mild expressive aphasia, low voice volume, and cognitive deficits.

## 2016-11-09 NOTE — Telephone Encounter (Signed)
Already placed orders. Delsa Sale just [employer]

## 2016-11-09 NOTE — Therapy (Signed)
[facility] [address] [place], [place], [address] Phone: [phone]   Fax:  [phone]  Physical Therapy Treatment  Patient Details  Name: Catherine [name] MRN: [id number] Date of Birth: 1946-02-04 Referring Provider: Dr. Jaynee Eagles  Encounter Date: 11/09/2016      PT End of Session - 11/09/16 1233    Visit Number 4   Number of Visits 17   Date for PT Re-Evaluation 12/12/16   Authorization Type Medicare primary; BCBS 2nd-GCODE every 10th visit   PT Start Time 0849   PT Stop Time 0932   PT Time Calculation (min) 43 min   Activity Tolerance Patient tolerated treatment well   Behavior During Therapy [facility] for tasks assessed/performed      Past Medical History:  Diagnosis Date  . Arthritis   . Cataract   . GERD (gastroesophageal reflux disease)   . Stroke ([place])   . TIA (transient ischemic attack)     Past Surgical History:  Procedure Laterality Date  . EYE SURGERY      There were no vitals filed for this visit.      Subjective Assessment - 11/09/16 0852    Subjective No changes over the weekend, but it was very busy.     Pertinent History CVA 07/19/16, R TKR 2008, ?possible Leg length difference with LLE shorter? from hospitalization   Patient Stated Goals Pt's goal for therapy is to get back to normal from prior to the CVA.   Currently in Pain? Yes   Pain Score 2    Pain Location Knee   Pain Orientation Right   Pain Descriptors / Indicators Aching   Pain Type Chronic pain   Pain Onset More than a month ago   Pain Frequency Intermittent   Aggravating Factors  weather   Pain Relieving Factors exercises, meds                         [facility] Adult PT Treatment/Exercise - 11/09/16 0905      Transfers   Transfers Sit to Stand;Stand to Sit   Sit to Stand 6: Modified independent (Device/Increase time);From bed   Stand to Sit 6: Modified independent (Device/Increase time);To bed;Without  upper extremity assist   Number of Reps 10 reps;2 sets   Comments 2nd set of sit<>stand performed with balance disc under RLE, to encourage increased LLE weigthbearing     Ambulation/Gait   Ambulation/Gait Yes   Ambulation/Gait Assistance 6: Modified independent (Device/Increase time)   Ambulation/Gait Assistance Details Gait training in session without cane, with cues for increased step length and heelstrike on LLE.  Pt continues to have significant Trendelenburg pattern on LLE without cane.  Trialed shoe insert and heel wedge, then only shoe insert in L shoe, due to again pt reporting she feels RLE is longer since R TKR.     Ambulation Distance (Feet) 75 Feet  x 2, then 200 ft 2   Assistive device None   Gait Pattern Step-through pattern;Decreased arm swing - left;Decreased step length - left;Decreased arm swing - right;Decreased dorsiflexion - left;Decreased stance time - left   Ambulation Surface Level;Indoor     Exercises   Exercises Lumbar     Lumbar Exercises: Stretches   Single Knee to Chest Stretch 2 reps;20 seconds   Lower Trunk Rotation 3 reps;20 seconds   Active Hamstring Stretch Right;Left;2 reps;30 seconds   Active Hamstring Stretch Limitations seated position     Knee/Hip Exercises: Standing  Forward Step Up Right;Left;15 reps;Step Height: 6";Hand Hold: 1;Hand Hold: 2   SLS Consecutive step taps to 6", then 12" steps, x 12 reps each   SLS with Vectors Lateral step taps to 6" step with 1 UE support x 10 reps                  PT Short Term Goals - 10/14/16 1630      PT SHORT TERM GOAL #1   Title Pt will perform HEP independently for improved functional mobility, balance and strength.  TARGET 11/12/16   Time 4   Period Weeks   Status New     PT SHORT TERM GOAL #2   Title Pt will improve TUG score to less than or equal to 15 seconds for decreased fall risk.   Time 4   Period Weeks   Status New     PT SHORT TERM GOAL #3   Title Pt will perform at least  8 of 10 reps of sit<>stand transfers with no UE support for improved transfer efficiency and safety.   Time 4   Period Weeks   Status New     PT SHORT TERM GOAL #4   Title Pt will verbalize understanding of CVA warning signs and risk factors.   Time 4   Period Weeks   Status New           PT Long Term Goals - 10/14/16 1632      PT LONG TERM GOAL #1   Title Pt will verbalize understanding of fall prevention in the home environment.  TARGET 12/12/16   Time 8   Period Weeks   Status New     PT LONG TERM GOAL #2   Title Pt will improve TUG score to less than or equal to 13.5 seconds for decreased fall risk.   Time 8   Period Weeks   Status New     PT LONG TERM GOAL #3   Title Pt will improve Dynamic Gait Index score to at least 19/24 for decreased fall risk.   Time 8   Period Weeks   Status New     PT LONG TERM GOAL #4   Title Pt will improve gait velocity to at least 2.3 ft/sec for improved gait efficiency and safety.   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   Time 8   Period Weeks   Status New               Plan - 11/09/16 1234    Clinical Impression Statement Functional lower extremity strengthening as well as stretching performed this visit.  Trialed small shoe insert for LLE to assist with more even gait pattern.  Pt reports it feels better, with slightly less Trendelenburg pattern noted on LLE.  Will continue to benefit from continued functional strengthening, compliant surface balance and gait activities.   Rehab Potential Good   PT Frequency 2x / week   PT Duration 8 weeks  plus eval   PT Treatment/Interventions ADLs/Self Care Home Management;Gait training;DME Instruction;Neuromuscular re-education;Balance training;Therapeutic exercise;Functional mobility training;Therapeutic activities   PT Next  Visit Plan Continue gait training with and without cane; LLE strength; balance compliant surfaces   Consulted and Agree with Plan of Care Patient  PLAN:  Begin looking at Short term goals next visit:- Patient will benefit from skilled therapeutic intervention in order to improve the following deficits and impairments:  Abnormal gait, Decreased balance, Decreased mobility, Decreased knowledge of use of DME, Difficulty walking, Decreased strength, Pain  Visit Diagnosis: Muscle weakness (generalized)  Other abnormalities of gait and mobility     Problem List Patient Active Problem List   Diagnosis Date Noted  . Hemiparesis affecting left side as late effect of stroke ([place]) 08/07/2016  . Osteoarthritis of right knee 08/07/2016  . Dysphagia, post-stroke 08/07/2016  . Sleep disturbance 08/07/2016  . Rash and nonspecific skin eruption 08/07/2016  . Left hemiparesis ([place])   . GERD (gastroesophageal reflux disease) 07/23/2016  . Basal ganglia infarction ([place]) 07/21/2016  . Stroke (cerebrum) ([place]) 07/19/2016  . HLD (hyperlipidemia) 08/14/2014  . Obesity (BMI 30-39.9) 08/17/2013  . Hyperlipidemia LDL goal < 100 02/15/2013  . TIA (transient ischemic attack) 12/14/2012  . Essential hypertension 12/14/2012  . Other and unspecified hyperlipidemia 12/14/2012    Rahn Lacuesta W. 11/09/2016, 12:41 PM Frazier Butt., PT  [facility] [address] [place], [place], [address] Phone: [phone]   Fax:  [phone]  Name: Catherine [name] MRN: [id number] Date of Birth: 1946/01/17

## 2016-11-09 NOTE — Therapy (Signed)
[facility] [address] [place], [place], [address] Phone: [phone]   Fax:  [phone]  Occupational Therapy Treatment  Patient Details  Name: Catherine [name] MRN: [id number] Date of Birth: 11-Jun-1945 Referring Provider: Dr. Sarina Ill  Encounter Date: 11/09/2016      OT End of Session - 11/09/16 1034    Visit Number 4   Number of Visits 17   Date for OT Re-Evaluation 12/12/16   Authorization Type MCR/BCBS - G code needed   Authorization - Visit Number 4   Authorization - Number of Visits 10   OT Start Time 0935   OT Stop Time 1020   OT Time Calculation (min) 45 min   Activity Tolerance Patient tolerated treatment well      Past Medical History:  Diagnosis Date  . Arthritis   . Cataract   . GERD (gastroesophageal reflux disease)   . Stroke ([place])   . TIA (transient ischemic attack)     Past Surgical History:  Procedure Laterality Date  . EYE SURGERY      There were no vitals filed for this visit.      Subjective Assessment - 11/09/16 0934    Subjective  My ring and small finger seem numb   Pertinent History 07/19/16: CVA.  PMH: TIA 2014, OA, GERD, Rt TKR 2008   Limitations No lifting > 10 lbs   Patient Stated Goals improve fine motor skills   Currently in Pain? No/denies  in UE's - see P.T. note for knee pain                      OT Treatments/Exercises (OP) - 11/09/16 1028      ADLs   ADL Comments Pt c/o numbness along ulnar n. distribution of Lt hand. Recommended patient discuss with PCP and possibly get referral to neurologist for nerve conduction study. Also instructed pt in positioning and positions to avoid (prolonged or repetitive full elbow flexion)     Shoulder Exercises: ROM/Strengthening   Other ROM/Strengthening Exercises Reviewed previously issued theraband HEP however pt still needed mod to max cueing to perform correctly and to prevent compensations. If  therapist did not correct, but was compensating and using incorrect muscles, therefore d/c current UE HEP and revised/updated today with exercises pt could do safely with less compensations.      Fine Motor Coordination   Other Fine Motor Exercises Gross motor coordination ex in standing (with countertop for support/balance): tossing/catching ball in Rt hand, then Lt hand -pt initially with max dropping from Lt hand and difficulty grading force behind movement, but improved with repetition                OT Education - 11/09/16 1008    Education provided Yes   Education Details Revised/updated UE HEP   Person(s) Educated Patient   Methods Explanation;Demonstration;Handout   Comprehension Verbalized understanding;Returned demonstration          OT Short Term Goals - 11/04/16 1017      OT SHORT TERM GOAL #1   Title Independent with coordination and putty HEP for Lt hand (STG's due 11/12/16)    Time 4   Period Weeks   Status Achieved     OT SHORT TERM GOAL #2   Title Pt to be independent with strengthening HEP for LUE   Time 4   Period Weeks   Status On-going     OT SHORT TERM GOAL #3  Title Pt to attend to Lt side with simple multi-tasking activity while carrying plate and/or cup of water w/o drops/spills Lt hand   Time 4   Period Weeks   Status New     OT SHORT TERM GOAL #4   Title Pt to perform high level LUE reaching to retrieve/replace 2 lb. object from high shelf with min compensations    Time 4   Period Weeks   Status New           OT Long Term Goals - 10/13/16 1413      OT LONG TERM GOAL #1   Title Improve coordination Lt hand as evidenced by reducing speed on 9 hole peg test to 28 sec. or less (LTG's due 12/12/16)   Baseline 35.72 sec. (Rt = 21.25 sec)    Time 8   Period Weeks   Status New     OT LONG TERM GOAL #2   Title Improve grip strength Lt hand to 50 lbs or greater to assist with opening jars, containers, etc   Baseline eval = 45 lbs (Rt  = 60 lbs)    Time 8   Period Weeks   Status New     OT LONG TERM GOAL #3   Title Pt to retrieve/replace 5 lb object from overheadl shelf LUE 5/5 trials with no compensations   Time 8   Period Weeks   Status New               Plan - 11/09/16 1036    Clinical Impression Statement Pt with max difficulty performing UE HEP correctly therefore revised/updated today for greater ease and proper technique. Pt with difficulty grading force required for task. Pt also complains of slurred speech and mild word finding difficulties. Speech therapy referral requested.    Rehab Potential Good   OT Frequency 2x / week   OT Duration 8 weeks   OT Treatment/Interventions Self-care/ADL training;DME and/or AE instruction;Patient/family education;Therapeutic exercises;Therapeutic activities;Neuromuscular education;Functional Mobility Training;Passive range of motion;Visual/perceptual remediation/compensation   Plan check on speech referral, review revised UE HEP, attention to Lt side   OT Home Exercise Plan Education provided:  Red putty/Coordination HEP, strengthening HEP LUE   Consulted and Agree with Plan of Care Patient      Patient will benefit from skilled therapeutic intervention in order to improve the following deficits and impairments:  Decreased coordination, Decreased range of motion, Impaired flexibility, Improper body mechanics, Impaired sensation, Decreased endurance, Decreased activity tolerance, Impaired UE functional use, Decreased mobility, Decreased strength, Impaired perceived functional ability  Visit Diagnosis: Other lack of coordination  Muscle weakness (generalized)  Unsteadiness on feet    Problem List Patient Active Problem List   Diagnosis Date Noted  . Hemiparesis affecting left side as late effect of stroke ([place]) 08/07/2016  . Osteoarthritis of right knee 08/07/2016  . Dysphagia, post-stroke 08/07/2016  . Sleep disturbance 08/07/2016  . Rash and nonspecific  skin eruption 08/07/2016  . Left hemiparesis ([place])   . GERD (gastroesophageal reflux disease) 07/23/2016  . Basal ganglia infarction ([place]) 07/21/2016  . Stroke (cerebrum) ([place]) 07/19/2016  . HLD (hyperlipidemia) 08/14/2014  . Obesity (BMI 30-39.9) 08/17/2013  . Hyperlipidemia LDL goal < 100 02/15/2013  . TIA (transient ischemic attack) 12/14/2012  . Essential hypertension 12/14/2012  . Other and unspecified hyperlipidemia 12/14/2012    Carey Bullocks, OTR/L 11/09/2016, 10:39 AM  [facility] [address] [place], [place], [address] Phone: [phone]   Fax:  [phone]  Name: Catherine [name] MRN: [id number] Date of Birth: [birth year]

## 2016-11-09 NOTE — Patient Instructions (Signed)
  Strengthening: Resisted Extension   Hold tubing in ___Lt__ hand(s), arm forward. Pull arm back, elbow straight. Repeat _10___ times per set. Do _1-2___ sessions per day, every other day.   SELF ASSISTED WITH OBJECT: Shoulder Flexion - Sitting    Hold cane with both hands. Raise arms up. __10_ reps per set, _2__ sets per day   Flexion - Supine (Dumbbell)    Lie with arms along body. Lift arms and shoulders straight up, palms facing each other holding 2 lb weight. Repeat __10__ times per set. Do _2___ sets per session. 2x per day

## 2016-11-11 ENCOUNTER — Other Ambulatory Visit: Payer: Self-pay | Admitting: Physical Medicine and Rehabilitation

## 2016-11-11 ENCOUNTER — Ambulatory Visit: Payer: Medicare Other | Admitting: Physical Therapy

## 2016-11-11 ENCOUNTER — Ambulatory Visit: Payer: Medicare Other | Admitting: Occupational Therapy

## 2016-11-11 DIAGNOSIS — R2681 Unsteadiness on feet: Secondary | ICD-10-CM

## 2016-11-11 DIAGNOSIS — R41841 Cognitive communication deficit: Secondary | ICD-10-CM | POA: Diagnosis not present

## 2016-11-11 DIAGNOSIS — R2689 Other abnormalities of gait and mobility: Secondary | ICD-10-CM

## 2016-11-11 DIAGNOSIS — R471 Dysarthria and anarthria: Secondary | ICD-10-CM | POA: Diagnosis not present

## 2016-11-11 DIAGNOSIS — R278 Other lack of coordination: Secondary | ICD-10-CM | POA: Diagnosis not present

## 2016-11-11 DIAGNOSIS — M6281 Muscle weakness (generalized): Secondary | ICD-10-CM | POA: Diagnosis not present

## 2016-11-11 DIAGNOSIS — R41842 Visuospatial deficit: Secondary | ICD-10-CM

## 2016-11-11 NOTE — Therapy (Signed)
[facility] [address] [place], [place], [address] Phone: [phone]   Fax:  [phone]  Occupational Therapy Treatment  Patient Details  Name: Catherine [name] MRN: [id number] Date of Birth: 04-19-1946 Referring Provider: Dr. Sarina Ill  Encounter Date: 11/11/2016      OT End of Session - 11/11/16 1036    Visit Number 5   Number of Visits 17   Date for OT Re-Evaluation 12/12/16   Authorization Type MCR/BCBS - G code needed   Authorization - Visit Number 5   Authorization - Number of Visits 10   OT Start Time 0845   OT Stop Time 0930   OT Time Calculation (min) 45 min   Activity Tolerance Patient tolerated treatment well      Past Medical History:  Diagnosis Date  . Arthritis   . Cataract   . GERD (gastroesophageal reflux disease)   . Stroke ([place])   . TIA (transient ischemic attack)     Past Surgical History:  Procedure Laterality Date  . EYE SURGERY      There were no vitals filed for this visit.      Subjective Assessment - 11/11/16 0848    Subjective  I am getting out and doing a little more   Pertinent History 07/19/16: CVA.  PMH: TIA 2014, OA, GERD, Rt TKR 2008   Limitations No lifting > 10 lbs   Patient Stated Goals improve fine motor skills   Currently in Pain? No/denies                      OT Treatments/Exercises (OP) - 11/11/16 0001      ADLs   ADL Comments Speech therapy referral now in EPIC and pt to get scheduled for speech evaluation. Also reminded pt to discuss with neurologist numbness along ulnar n. distribution - pt agrees to do so.      Visual/Perceptual Exercises   Other Exercises Functional task to work on Lt awareness/attention by carrying plate of "food" in Lt hand with ambulation while carrying conversation - pt had no drops/spills. Progressed to carrying cup 3/4 full of water in Lt hand w/ ambulation and conversation with no spills but near spills x 3.  Therapist emphasized increased attn. needed to Lt side and safety recommendations including: only cold beverages in Lt hand with open containers and not filling completely, recommended carrying hot beverages in Rt hand or in Lt hand with travel mug (closed top). Pt agreeable to recommendations. Pt also then practiced pouring water from cup in Lt hand to another cup for control/coordination Lt hand.      Neurological Re-education Exercises   Other Exercises 1 Reviewed revised UE HEP for LUE weakness - pt still required min cueing to prevent shoulder IR and elbow flexion but less overall cueing and less compensations.                   OT Short Term Goals - 11/11/16 1037      OT SHORT TERM GOAL #1   Title Independent with coordination and putty HEP for Lt hand (STG's due 11/12/16)    Time 4   Period Weeks   Status Achieved     OT SHORT TERM GOAL #2   Title Pt to be independent with strengthening HEP for LUE   Time 4   Period Weeks   Status Achieved     OT SHORT TERM GOAL #3   Title Pt to  attend to Lt side with simple multi-tasking activity while carrying plate and/or cup of water w/o drops/spills Lt hand   Time 4   Period Weeks   Status On-going     OT SHORT TERM GOAL #4   Title Pt to perform high level LUE reaching to retrieve/replace 2 lb. object from high shelf with min compensations    Time 4   Period Weeks   Status New           OT Long Term Goals - 10/13/16 1413      OT LONG TERM GOAL #1   Title Improve coordination Lt hand as evidenced by reducing speed on 9 hole peg test to 28 sec. or less (LTG's due 12/12/16)   Baseline 35.72 sec. (Rt = 21.25 sec)    Time 8   Period Weeks   Status New     OT LONG TERM GOAL #2   Title Improve grip strength Lt hand to 50 lbs or greater to assist with opening jars, containers, etc   Baseline eval = 45 lbs (Rt = 60 lbs)    Time 8   Period Weeks   Status New     OT LONG TERM GOAL #3   Title Pt to retrieve/replace 5 lb  object from overheadl shelf LUE 5/5 trials with no compensations   Time 8   Period Weeks   Status New               Plan - 11/11/16 1037    Clinical Impression Statement Pt meeting and progressing towards STG's. Pt reports overall less drops from Lt hand. Speech therapy referral received and pt to make appt for speech evaluation.    Rehab Potential Good   OT Frequency 2x / week   OT Duration 8 weeks   OT Treatment/Interventions Self-care/ADL training;DME and/or AE instruction;Patient/family education;Therapeutic exercises;Therapeutic activities;Neuromuscular education;Functional Mobility Training;Passive range of motion;Visual/perceptual remediation/compensation   Plan LUE high level functional reaching as able, coordination and grip strength Lt hand   OT Home Exercise Plan Education provided:  Red putty/Coordination HEP, strengthening HEP LUE   Consulted and Agree with Plan of Care Patient      Patient will benefit from skilled therapeutic intervention in order to improve the following deficits and impairments:  Decreased coordination, Decreased range of motion, Impaired flexibility, Improper body mechanics, Impaired sensation, Decreased endurance, Decreased activity tolerance, Impaired UE functional use, Decreased mobility, Decreased strength, Impaired perceived functional ability  Visit Diagnosis: Other lack of coordination  Muscle weakness (generalized)  Visuospatial deficit    Problem List Patient Active Problem List   Diagnosis Date Noted  . Hemiparesis affecting left side as late effect of stroke ([place]) 08/07/2016  . Osteoarthritis of right knee 08/07/2016  . Dysphagia, post-stroke 08/07/2016  . Sleep disturbance 08/07/2016  . Rash and nonspecific skin eruption 08/07/2016  . Left hemiparesis ([place])   . GERD (gastroesophageal reflux disease) 07/23/2016  . Basal ganglia infarction ([place]) 07/21/2016  . Stroke (cerebrum) ([place]) 07/19/2016  . HLD (hyperlipidemia)  08/14/2014  . Obesity (BMI 30-39.9) 08/17/2013  . Hyperlipidemia LDL goal < 100 02/15/2013  . TIA (transient ischemic attack) 12/14/2012  . Essential hypertension 12/14/2012  . Other and unspecified hyperlipidemia 12/14/2012    Carey Bullocks, OTR/L 11/11/2016, 10:39 AM  [facility] [address] [place], [place], [address] Phone: [phone]   Fax:  [phone]  Name: Catherine [name] MRN: [id number] Date of Birth: 10-Dec-1945

## 2016-11-12 NOTE — Therapy (Signed)
[facility] [address] [place], [place], [address] Phone: [phone]   Fax:  [phone]  Physical Therapy Treatment  Patient Details  Name: Catherine [name] MRN: [id number] Date of Birth: [birth year] Referring Provider: Dr. Jaynee Eagles  Encounter Date: 11/11/2016      PT End of Session - 11/12/16 1220    Visit Number 5   Number of Visits 17   Date for PT Re-Evaluation 12/12/16   Authorization Type Medicare primary; BCBS 2nd-GCODE every 10th visit   PT Start Time 0935   PT Stop Time 1016   PT Time Calculation (min) 41 min   Activity Tolerance Patient tolerated treatment well   Behavior During Therapy [facility] for tasks assessed/performed      Past Medical History:  Diagnosis Date  . Arthritis   . Cataract   . GERD (gastroesophageal reflux disease)   . Stroke ([place])   . TIA (transient ischemic attack)     Past Surgical History:  Procedure Laterality Date  . EYE SURGERY      There were no vitals filed for this visit.      Subjective Assessment - 11/11/16 0935    Subjective Have been busy this week.  No changes since last visit; took a nap yesterday and that made me feel better.   Pertinent History CVA 07/19/16, R TKR 2008, ?possible Leg length difference with LLE shorter? from hospitalization   Patient Stated Goals Pt's goal for therapy is to get back to normal from prior to the CVA.   Currently in Pain? No/denies   Pain Onset --                         [facility] Adult PT Treatment/Exercise - 11/12/16 0001      Transfers   Transfers Sit to Stand;Stand to Sit   Sit to Stand 6: Modified independent (Device/Increase time);Without upper extremity assist;With upper extremity assist;From bed;From chair/3-in-1   Stand to Sit 6: Modified independent (Device/Increase time);Without upper extremity assist;With upper extremity assist;To bed;To chair/3-in-1   Number of Reps 10 reps;Other sets (comment)  from  20", then 18" surfaces   Transfer Cueing Cues for increased forward lean and decreased reliance on UEs   Comments 3rd set-from mat surface, R LE standing on balance disk for increased reliance on and weightbearing through LLE.     Ambulation/Gait   Ambulation/Gait Yes   Ambulation/Gait Assistance 6: Modified independent (Device/Increase time)   Ambulation Distance (Feet) 400 Feet  x 2   Assistive device None;Straight cane   Gait Pattern Step-through pattern;Decreased arm swing - left;Decreased step length - left;Decreased arm swing - right;Decreased dorsiflexion - left;Decreased stance time - left   Ambulation Surface Level;Indoor   Gait Comments Cues provided for increased step length on LLE and heelstrike     Standardized Balance Assessment   Standardized Balance Assessment Timed Up and Go Test     Timed Up and Go Test   TUG Normal TUG   Normal TUG (seconds) 15.33  with cane; 14.34 no cane     Self-Care   Self-Care Other Self-Care Comments   Other Self-Care Comments  Assessed patient's leg length difference, with L LE 1.5 cm shorter than RLE (measured from greater trochanter to medial malleolus); Trialed smallest height Dr. Felicie Morn shoe insert available in clinic for gait and balance activities throughout session.  Pt notes improved stability, even gait pattern and reports less strain on low back.  Encouraged patient  to try to purchase 3/8" or less shoe insert over the counter, for use in LLE for improved balance and gait pattern.             Balance Exercises - 11/12/16 1214      Balance Exercises: Standing   SLS with Vectors Solid surface;Upper extremity assist 1;Upper extremity assist 2  Alternating step taps to 6", 12" step x 10 reps each   Rockerboard Anterior/posterior;Head turns;EO  Head nods x 5; hip/ankle strategy x 20 reps   Step Ups Forward;6 inch;UE support 2  x 5 reps   Balance Beam marching in place x 10 reps, alternating forward kicks x 10 reps, alternating  forward step taps x 10 reps   Other Standing Exercises STagger stance rocking forward and back for dynamic weightshfit and ankle dorsiflexion on LLE x 20 reps; on rockerboard:  balance in center with alternating UE lifts x 10 reps  Forward>back step and weightshift x 10 reps each leg           PT Education - 11/12/16 1219    Education provided Yes   Education Details leg length difference, potential benefit from shoe insert in L shoe   Person(s) Educated Patient   Methods Explanation   Comprehension Verbalized understanding          PT Short Term Goals - 11/12/16 1212      PT SHORT TERM GOAL #1   Title Pt will perform HEP independently for improved functional mobility, balance and strength.  TARGET 11/12/16   Time 4   Period Weeks   Status New     PT SHORT TERM GOAL #2   Title Pt will improve TUG score to less than or equal to 15 seconds for decreased fall risk.   Baseline >15 sec with cane; <15 seconds with no device   Time 4   Period Weeks   Status Partially Met     PT SHORT TERM GOAL #3   Title Pt will perform at least 8 of 10 reps of sit<>stand transfers with no UE support for improved transfer efficiency and safety.   Baseline needs UE support   Time 4   Period Weeks   Status Partially Met     PT SHORT TERM GOAL #4   Title Pt will verbalize understanding of CVA warning signs and risk factors.   Time 4   Period Weeks   Status New           PT Long Term Goals - 10/14/16 1632      PT LONG TERM GOAL #1   Title Pt will verbalize understanding of fall prevention in the home environment.  TARGET 12/12/16   Time 8   Period Weeks   Status New     PT LONG TERM GOAL #2   Title Pt will improve TUG score to less than or equal to 13.5 seconds for decreased fall risk.   Time 8   Period Weeks   Status New     PT LONG TERM GOAL #3   Title Pt will improve Dynamic Gait Index score to at least 19/24 for decreased fall risk.   Time 8   Period Weeks   Status New      PT LONG TERM GOAL #4   Title Pt will improve gait velocity to at least 2.3 ft/sec for improved gait efficiency and safety.   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   Time 8   Period Weeks   Status New               Plan - 11/12/16 1220    Clinical Impression Statement Pt uses trial L shoe insert for most of session, after measure indicates that LLE is in fact 1.5 cm shorter than RLE.  Pt noted to have smoother, more even gait pattern, and pt reports decreased strain in low back.  STG check initiated, with STG 2 and 3 partially met.  Pt will continue to benefit from skilled PT to address functional strengthening, compliant surfaces and gait.   Rehab Potential Good   PT Frequency 2x / week   PT Duration 8 weeks  plus eval   PT Treatment/Interventions ADLs/Self Care Home Management;Gait training;DME Instruction;Neuromuscular re-education;Balance training;Therapeutic exercise;Functional mobility training;Therapeutic activities   PT Next Visit Plan Continue gait training with and without cane; LLE strength; balance activities on compliant surfaces, check remaining STGs   Consulted and Agree with Plan of Care Patient      Patient will benefit from skilled therapeutic intervention in order to improve the following deficits and impairments:  Abnormal gait, Decreased balance, Decreased mobility, Decreased knowledge of use of DME, Difficulty walking, Decreased strength, Pain  Visit Diagnosis: Other abnormalities of gait and mobility  Unsteadiness on feet     Problem List Patient Active Problem List   Diagnosis Date Noted  . Hemiparesis affecting left side as late effect of stroke ([place]) 08/07/2016  . Osteoarthritis of right knee 08/07/2016  . Dysphagia, post-stroke 08/07/2016  . Sleep disturbance 08/07/2016  . Rash and  nonspecific skin eruption 08/07/2016  . Left hemiparesis ([place])   . GERD (gastroesophageal reflux disease) 07/23/2016  . Basal ganglia infarction ([place]) 07/21/2016  . Stroke (cerebrum) ([place]) 07/19/2016  . HLD (hyperlipidemia) 08/14/2014  . Obesity (BMI 30-39.9) 08/17/2013  . Hyperlipidemia LDL goal < 100 02/15/2013  . TIA (transient ischemic attack) 12/14/2012  . Essential hypertension 12/14/2012  . Other and unspecified hyperlipidemia 12/14/2012    Jelisa Lazy Y U W. 11/12/2016, 12:27 PM  Frazier Butt., PT   [facility] [address] [place], [place], [address] Phone: [phone]   Fax:  [phone]  Name: Catherine [name] MRN: [id number] Date of Birth: 09/22/1945

## 2016-11-16 ENCOUNTER — Ambulatory Visit: Payer: Medicare Other | Admitting: Physical Therapy

## 2016-11-16 ENCOUNTER — Ambulatory Visit: Payer: Medicare Other | Admitting: Occupational Therapy

## 2016-11-16 DIAGNOSIS — R41841 Cognitive communication deficit: Secondary | ICD-10-CM | POA: Diagnosis not present

## 2016-11-16 DIAGNOSIS — R2689 Other abnormalities of gait and mobility: Secondary | ICD-10-CM | POA: Diagnosis not present

## 2016-11-16 DIAGNOSIS — R278 Other lack of coordination: Secondary | ICD-10-CM | POA: Diagnosis not present

## 2016-11-16 DIAGNOSIS — R2681 Unsteadiness on feet: Secondary | ICD-10-CM | POA: Diagnosis not present

## 2016-11-16 DIAGNOSIS — M6281 Muscle weakness (generalized): Secondary | ICD-10-CM

## 2016-11-16 DIAGNOSIS — R471 Dysarthria and anarthria: Secondary | ICD-10-CM | POA: Diagnosis not present

## 2016-11-16 NOTE — Patient Instructions (Addendum)
Gastroc / Heel Cord Stretch - Seated With Towel    Sit on floor, towel or black band around ball of foot. Gently pull foot in toward body, stretching heel cord and calf. Hold for 30___ seconds. Repeat on other leg. Repeat __3_ times. Do _1-2__ times per day.  Copyright  VHI. All rights reserved.  Gastroc / Heel Cord Stretch - On Step    Stand with left heel propped on 2" block.  Heel should be resting on the ground, and stand up tall, as you feel a gentle stretch in calf. Repeat _3__ times each leg. Do _1-2 times per day.  Copyright  VHI. All rights reserved.    STANDING IN CORNER with CHAIR in FRONT:  Feet Apart (Compliant Surface) Head Motion - Eyes Open    With eyes open, standing on compliant surface: pillow or towel________, feet shoulder width apart, move head slowly: up and down 5 times, then side to side 5 times. Do __1-2__ sessions per day.  Copyright  VHI. All rights reserved.  Feet Apart (Compliant Surface) Varied Arm Positions - Eyes Closed    Stand on compliant surface: ___pillow or towel_____ with feet shoulder width apart and arms holding to chair or by sides. Close eyes and visualize upright position. Hold__10__ seconds. Repeat __3_ times per session.   Copyright  VHI. All rights reserved.  Marching in Place: Varied Surfaces    Standing in corner on towel, March in place, slowly lifting knees toward ceiling. Repeat __10__ times per session. Do __1__ sessions per day. You can also alternate legs kicking forward, x 10 Alternate tapping the floor, then return to pillow, x 10 reps each leg.  Copyright  VHI. All rights reserved.

## 2016-11-16 NOTE — Therapy (Signed)
[facility] [address] [place], [place], [address] Phone: [phone]   Fax:  [phone]  Occupational Therapy Treatment  Patient Details  Name: Catherine [name] MRN: [id number] Date of Birth: [birth year] Referring Provider: Dr. Sarina Ill  Encounter Date: 11/16/2016      OT End of Session - 11/16/16 1144    Visit Number 6   Number of Visits 17   Date for OT Re-Evaluation 12/12/16   Authorization Type MCR/BCBS - G code needed   Authorization - Visit Number 6   Authorization - Number of Visits 10   OT Start Time 1102   OT Stop Time 1147   OT Time Calculation (min) 45 min   Activity Tolerance Patient tolerated treatment well      Past Medical History:  Diagnosis Date  . Arthritis   . Cataract   . GERD (gastroesophageal reflux disease)   . Stroke ([place])   . TIA (transient ischemic attack)     Past Surgical History:  Procedure Laterality Date  . EYE SURGERY      There were no vitals filed for this visit.      Subjective Assessment - 11/16/16 1105    Subjective  The numbness seems to be improving   Pertinent History 07/19/16: CVA.  PMH: TIA 2014, OA, GERD, Rt TKR 2008   Limitations No lifting > 10 lbs   Patient Stated Goals improve fine motor skills   Currently in Pain? No/denies                      OT Treatments/Exercises (OP) - 11/16/16 0001      Exercises   Exercises Hand     Shoulder Exercises: ROM/Strengthening   UBE (Upper Arm Bike) UBE x 5 min. Level 1      Hand Exercises   Other Hand Exercises Gripper at level 2 resistance to pick up blocks Lt hand for sustained grip strength and attention to Lt side. Pt with mod difficulty and drops. Pt required 1 rest break.      Functional Reaching Activities   Mid Level Mid to high level reaching LUE to place pegs in pegboard vertical surface for coordination, strength/endurance LUE, and proper positioning with reaching activities.  Pt required min cueing to correct shoulder                   OT Short Term Goals - 11/11/16 1037      OT SHORT TERM GOAL #1   Title Independent with coordination and putty HEP for Lt hand (STG's due 11/12/16)    Time 4   Period Weeks   Status Achieved     OT SHORT TERM GOAL #2   Title Pt to be independent with strengthening HEP for LUE   Time 4   Period Weeks   Status Achieved     OT SHORT TERM GOAL #3   Title Pt to attend to Lt side with simple multi-tasking activity while carrying plate and/or cup of water w/o drops/spills Lt hand   Time 4   Period Weeks   Status On-going     OT SHORT TERM GOAL #4   Title Pt to perform high level LUE reaching to retrieve/replace 2 lb. object from high shelf with min compensations    Time 4   Period Weeks   Status New           OT Long Term Goals - 10/13/16 1413  OT LONG TERM GOAL #1   Title Improve coordination Lt hand as evidenced by reducing speed on 9 hole peg test to 28 sec. or less (LTG's due 12/12/16)   Baseline 35.72 sec. (Rt = 21.25 sec)    Time 8   Period Weeks   Status New     OT LONG TERM GOAL #2   Title Improve grip strength Lt hand to 50 lbs or greater to assist with opening jars, containers, etc   Baseline eval = 45 lbs (Rt = 60 lbs)    Time 8   Period Weeks   Status New     OT LONG TERM GOAL #3   Title Pt to retrieve/replace 5 lb object from overheadl shelf LUE 5/5 trials with no compensations   Time 8   Period Weeks   Status New               Plan - 11/16/16 1145    Clinical Impression Statement Pt progressing with LUE function, coordination, and strength. Pt also reports decreased numbness with ulnar n. distribution   Rehab Potential Good   OT Frequency 2x / week   OT Duration 8 weeks   OT Treatment/Interventions Self-care/ADL training;DME and/or AE instruction;Patient/family education;Therapeutic exercises;Therapeutic activities;Neuromuscular education;Functional Mobility  Training;Passive range of motion;Visual/perceptual remediation/compensation   Plan work towards STG #3 and #4.    OT Home Exercise Plan Education provided:  Red putty/Coordination HEP, strengthening HEP LUE   Consulted and Agree with Plan of Care Patient      Patient will benefit from skilled therapeutic intervention in order to improve the following deficits and impairments:  Decreased coordination, Decreased range of motion, Impaired flexibility, Improper body mechanics, Impaired sensation, Decreased endurance, Decreased activity tolerance, Impaired UE functional use, Decreased mobility, Decreased strength, Impaired perceived functional ability  Visit Diagnosis: Other lack of coordination  Muscle weakness (generalized)    Problem List Patient Active Problem List   Diagnosis Date Noted  . Hemiparesis affecting left side as late effect of stroke ([place]) 08/07/2016  . Osteoarthritis of right knee 08/07/2016  . Dysphagia, post-stroke 08/07/2016  . Sleep disturbance 08/07/2016  . Rash and nonspecific skin eruption 08/07/2016  . Left hemiparesis ([place])   . GERD (gastroesophageal reflux disease) 07/23/2016  . Basal ganglia infarction ([place]) 07/21/2016  . Stroke (cerebrum) ([place]) 07/19/2016  . HLD (hyperlipidemia) 08/14/2014  . Obesity (BMI 30-39.9) 08/17/2013  . Hyperlipidemia LDL goal < 100 02/15/2013  . TIA (transient ischemic attack) 12/14/2012  . Essential hypertension 12/14/2012  . Other and unspecified hyperlipidemia 12/14/2012    Carey Bullocks, OTR/L 11/16/2016, 11:46 AM  [facility] [address] [place], [place], [address] Phone: [phone]   Fax:  [phone]  Name: Catherine [name] MRN: [id number] Date of Birth: 1945-10-11

## 2016-11-16 NOTE — Therapy (Signed)
[facility] [address] [place], [place], [address] Phone: [phone]   Fax:  [phone]  Physical Therapy Treatment  Patient Details  Name: Catherine [name] MRN: [id number] Date of Birth: 1946/04/05 Referring Provider: Dr. Jaynee [name]  Encounter Date: 11/16/2016      PT End of Session - 11/16/16 1358    Visit Number 6   Number of Visits 17   Date for PT Re-Evaluation 12/12/16   Authorization Type Medicare primary; BCBS 2nd-GCODE every 10th visit   PT Start Time 1020   PT Stop Time 1100   PT Time Calculation (min) 40 min   Activity Tolerance Patient tolerated treatment well   Behavior During Therapy [facility] for tasks assessed/performed      Past Medical History:  Diagnosis Date  . Arthritis   . Cataract   . GERD (gastroesophageal reflux disease)   . Stroke ([place])   . TIA (transient ischemic attack)     Past Surgical History:  Procedure Laterality Date  . EYE SURGERY      There were no vitals filed for this visit.      Subjective Assessment - 11/16/16 1023    Subjective R knee was bothering me yesterday, mostly because of the rainy weather.Didn't get a chance to get the small wedge insert for the left shoe, but I will plan to get it.   Pertinent History CVA 07/19/16, R TKR 2008, ?possible Leg length difference with LLE shorter? from hospitalization   Patient Stated Goals Pt's goal for therapy is to get back to normal from prior to the CVA.   Currently in Pain? No/denies                         [facility] Adult PT Treatment/Exercise - 11/16/16 1352      High Level Balance   High Level Balance Activities Backward walking  Forward/back walking x 3 reps-review of HEP   High Level Balance Comments Marching forward 15-20 ft, 3 reps; cues for posture, abdominal activation and use of visual cues     Knee/Hip Exercises: Stretches   Active Hamstring Stretch Right;Left;2 reps;30 seconds   Active Hamstring  Stretch Limitations seated position   Other Knee/Hip Stretches Seated heelcord stretch 30 seconds, 3 reps on LLE, using belt/black theraband in seated position.  Standing heelcord stretch, foot propped on 2" block, 2 reps 30 seconds     Knee/Hip Exercises: Aerobic   Stepper Seated SciFit Stepper, 6 minutes, 4 extremities, for leg strengthening.             Balance Exercises - 11/16/16 1354      Balance Exercises: Standing   Standing Eyes Opened Foam/compliant surface;Head turns;5 reps;Wide (BOA)  Head nods; 2 sets    Standing Eyes Closed Foam/compliant surface;Wide (BOA);10 secs;2 reps   Heel Raises Limitations x 10   Toe Raise Limitations x 10   Other Standing Exercises On compliant surface in corner: marching in place x 10 reps, forward kicks x 10 reps, then forward step taps x 10 reps.  Attempted heel walking, but discontinued due to unsteadiness    With corner balance exercises-pt needs cues for upright posture and for finding small range of head motion that allows for stability/challenge for balance.       PT Education - 11/16/16 1357    Education provided Yes   Education Details Updates to [employer]) Educated Patient   Methods Explanation;Demonstration;Handout   Comprehension Verbalized  understanding;Returned demonstration          PT Short Term Goals - 11/12/16 1212      PT SHORT TERM GOAL #1   Title Pt will perform HEP independently for improved functional mobility, balance and strength.  TARGET 11/12/16   Time 4   Period Weeks   Status New     PT SHORT TERM GOAL #2   Title Pt will improve TUG score to less than or equal to 15 seconds for decreased fall risk.   Baseline >15 sec with cane; <15 seconds with no device   Time 4   Period Weeks   Status Partially Met     PT SHORT TERM GOAL #3   Title Pt will perform at least 8 of 10 reps of sit<>stand transfers with no UE support for improved transfer efficiency and safety.   Baseline needs UE support    Time 4   Period Weeks   Status Partially Met     PT SHORT TERM GOAL #4   Title Pt will verbalize understanding of CVA warning signs and risk factors.   Time 4   Period Weeks   Status New           PT Long Term Goals - 10/14/16 1632      PT LONG TERM GOAL #1   Title Pt will verbalize understanding of fall prevention in the home environment.  TARGET 12/12/16   Time 8   Period Weeks   Status New     PT LONG TERM GOAL #2   Title Pt will improve TUG score to less than or equal to 13.5 seconds for decreased fall risk.   Time 8   Period Weeks   Status New     PT LONG TERM GOAL #3   Title Pt will improve Dynamic Gait Index score to at least 19/24 for decreased fall risk.   Time 8   Period Weeks   Status New     PT LONG TERM GOAL #4   Title Pt will improve gait velocity to at least 2.3 ft/sec for improved gait efficiency and safety.   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   Time 8   Period Weeks   Status New               Plan - 11/16/16 1358    Clinical Impression Statement Continueing to address balance, flexibility, for optimal gait technique.  Most of standing, short distance gait activities performed with trial lift in L shoe.  Pt to get one for her own to allow for improved gait pattern due to LLE shortness.  Pt will continue to benefit from skilled PT towards goals.   Rehab Potential Good   PT Frequency 2x / week   PT Duration 8 weeks  plus eval   PT Treatment/Interventions ADLs/Self Care Home Management;Gait training;DME Instruction;Neuromuscular re-education;Balance training;Therapeutic exercise;Functional mobility training;Therapeutic activities   PT Next Visit Plan Check  reamining STGs; compliant surfaces with gait and balance; review HEP   Consulted and Agree with Plan of Care Patient       Patient will benefit from skilled therapeutic intervention in order to improve the following deficits and impairments:  Abnormal gait, Decreased balance, Decreased mobility, Decreased knowledge of use of DME, Difficulty  walking, Decreased strength, Pain  Visit Diagnosis: Unsteadiness on feet  Other abnormalities of gait and mobility     Problem List Patient Active Problem List   Diagnosis Date Noted  . Hemiparesis affecting left side as late effect of stroke ([place]) 08/07/2016  . Osteoarthritis of right knee 08/07/2016  . Dysphagia, post-stroke 08/07/2016  . Sleep disturbance 08/07/2016  . Rash and nonspecific skin eruption 08/07/2016  . Left hemiparesis ([place])   . GERD (gastroesophageal reflux disease) 07/23/2016  . Basal ganglia infarction ([place]) 07/21/2016  . Stroke (cerebrum) ([place]) 07/19/2016  . HLD (hyperlipidemia) 08/14/2014  . Obesity (BMI 30-39.9) 08/17/2013  . Hyperlipidemia LDL goal < 100 02/15/2013  . TIA (transient ischemic attack) 12/14/2012  . Essential hypertension 12/14/2012  . Other and unspecified hyperlipidemia 12/14/2012    Oleta Gunnoe W. 11/16/2016, 2:01 PM Frazier Butt., PT  [facility] [address] [place], [place], [address] Phone: [phone]   Fax:  [phone]  Name: Catherine [name] MRN: [id number] Date of Birth: [birth year]

## 2016-11-17 DIAGNOSIS — Z01419 Encounter for gynecological examination (general) (routine) without abnormal findings: Secondary | ICD-10-CM | POA: Diagnosis not present

## 2016-11-17 DIAGNOSIS — Z124 Encounter for screening for malignant neoplasm of cervix: Secondary | ICD-10-CM | POA: Diagnosis not present

## 2016-11-18 ENCOUNTER — Ambulatory Visit: Payer: Medicare Other | Admitting: Physical Therapy

## 2016-11-18 ENCOUNTER — Ambulatory Visit: Payer: Medicare Other | Admitting: Occupational Therapy

## 2016-11-18 ENCOUNTER — Ambulatory Visit: Payer: Medicare Other

## 2016-11-18 DIAGNOSIS — R278 Other lack of coordination: Secondary | ICD-10-CM | POA: Diagnosis not present

## 2016-11-18 DIAGNOSIS — R41841 Cognitive communication deficit: Secondary | ICD-10-CM

## 2016-11-18 DIAGNOSIS — R471 Dysarthria and anarthria: Secondary | ICD-10-CM

## 2016-11-18 DIAGNOSIS — M6281 Muscle weakness (generalized): Secondary | ICD-10-CM

## 2016-11-18 DIAGNOSIS — R2681 Unsteadiness on feet: Secondary | ICD-10-CM

## 2016-11-18 DIAGNOSIS — R2689 Other abnormalities of gait and mobility: Secondary | ICD-10-CM | POA: Diagnosis not present

## 2016-11-18 NOTE — Patient Instructions (Signed)
It is important to avoid accidents which may result in broken bones.  Here are a few ideas on how to make your home safer so you will be less likely to trip or fall.  1. Use nonskid mats or non slip strips in your shower or tub, on your bathroom floor and around sinks.  If you know that you have spilled water, wipe it up! 2. In the bathroom, it is important to have properly installed grab bars on the walls or on the edge of the tub.  Towel racks are NOT strong enough for you to hold onto or to pull on for support. 3. Stairs and hallways should have enough light.  Add lamps or night lights if you need more light. 4. It is good to have handrails on both sides of the stairs if possible.  Always fix broken handrails right away. 5. It is important to see the edges of steps.  Paint the edges of outdoor steps white so you can see them better.  Put colored tape on the edge of inside steps. 6. Throw-rugs are dangerous because they can slide.  Removing the rugs is the best idea, but if they must stay, add adhesive carpet tape to prevent slipping. 7. Do not keep things on stairs or in the halls.  Remove small furniture that blocks the halls as it may cause you to trip.  Keep telephone and electrical cords out of the way where you walk. 8. Always were sturdy, rubber-soled shoes for good support.  Never wear just socks, especially on the stairs.  Socks may cause you to slip or fall.  Do not wear full-length housecoats as you can easily trip on the bottom.  9. Place the things you use the most on the shelves that are the easiest to reach.  If you use a stepstool, make sure it is in good condition.  If you feel unsteady, DO NOT climb, ask for help. 10. If a health professional advises you to use a cane or walker, do not be ashamed.  These items can keep you from falling and breaking your bones.

## 2016-11-18 NOTE — Therapy (Signed)
[facility] [address] [place], [place], [address] Phone: [phone]   Fax:  [phone]  Speech Language Pathology Evaluation  Patient Details  Name: Catherine [name] MRN: [id number] Date of Birth: [birth year] Referring Provider: Sarina Ill, MD  Encounter Date: 11/18/2016      End of Session - 11/19/16 1201    Visit Number 1   Number of Visits 17   Date for SLP Re-Evaluation 01/22/17   SLP Start Time 0803   SLP Stop Time  0847   SLP Time Calculation (min) 44 min   Activity Tolerance Patient tolerated treatment well      Past Medical History:  Diagnosis Date  . Arthritis   . Cataract   . GERD (gastroesophageal reflux disease)   . Stroke ([place])   . TIA (transient ischemic attack)     Past Surgical History:  Procedure Laterality Date  . EYE SURGERY      There were no vitals filed for this visit.      Subjective Assessment - 11/18/16 0810    Subjective Pt complains of slurred speech - today rates 5-6/10.   Currently in Pain? No/denies            SLP Evaluation [facility] - 11/19/16 0001      SLP Visit Information   SLP Received On 11/18/16   Referring Provider Sarina Ill, MD   Onset Date March 2018   Medical Diagnosis CVA     General Information   HPI Pt with CVA in March 2018, complains of slurred speech today, hoarse voice, and rare word finding deficits. Does not endorse any cognitive deficits, howerver OT suggested referral for ST due to cognitive linguistics deficts, slurred spech, and "rough" voice. ENT visit in April ID'd bowed vocal folds upon visual inspection of pt's larynx.     Prior Functional Status   Cognitive/Linguistic Baseline Within functional limits   Type of Home House    Lives With Family     Motor Speech   Overall Motor Speech Appears within functional limits for tasks assessed   Phonation Low vocal intensity   Intelligibility Intelligible   Motor Planning  Impaired  oral (non verbal) apraxia noted in oral motor tasks   Volume Soft  average conversation volume 10 minutes =68dB     After loud "ah", pt's voice was notably louder, at average upper 60s-low 70s dB for approx 60 seconds, then dropped back to previous average. This indicates pt would be a good candidate for  therapy to improve speech loudness.                    SLP Education - 11/19/16 1200    Education provided Yes   Education Details loud /a/   Person(s) Educated Patient   Methods Explanation;Demonstration;Verbal cues;Handout   Comprehension Verbalized understanding;Returned demonstration;Verbal cues required;Need further instruction          SLP Short Term Goals - 11/19/16 1205      SLP SHORT TERM GOAL #1   Title pt will use abdominal breathing (AB) at rest 85% of the time over 5 minute period x3 sessions   Time 4   Period Weeks   Status New     SLP SHORT TERM GOAL #2   Title pt will demo AB in 14/20 sentence responses x2 sessions   Time 4   Period Weeks   Status New     SLP SHORT TERM GOAL #3   Title pt  will produce sentences with low 70s dB average loudness 80% of the time   Time 4   Period Weeks   Status New     SLP SHORT TERM GOAL #4   Title pt will undergo cognitive linguistic assessment    Time 2   Period Weeks   Status New          SLP Long Term Goals - 11/19/16 07-29-1207      SLP LONG TERM GOAL #1   Title pt will demo AB 70% of the time in 10 minutes simple conversaiton x3 visits   Time 8   Period Weeks   Status New     SLP LONG TERM GOAL #2   Title pt will improve conversational loudness to low 70s dB average, in 10 minutes simple conversation  x3   Baseline pt rated voice 5-6/10 on eval   Time 8   Period Weeks   Status New     SLP LONG TERM GOAL #3   Title pt will demo adequate respiratory support 80% of the time in 7 minutes mod complex conversation   Time 8   Period Weeks   Status New          Plan - 11/19/16  July 28, 1200    Clinical Impression Statement Pt presents today with weak/soft voice, possible cognitive linguistic deficits, and reported dysarthria and anomia (both of these not observed today during evaluation). Pt is concerned about her voice and skilled ST is beneficial to provide her with opportunity to improve her conversational volume as well as any possible cognitive linguistic deficits.    Speech Therapy Frequency 2x / week   Duration --  8 weeks or 17 total visits   Treatment/Interventions Compensatory strategies;Patient/family education;Functional tasks;SLP instruction and feedback;Multimodal communcation approach;Cognitive reorganization;Environmental controls  any or all may be used   Potential to Achieve Goals Good   SLP Home Exercise Plan provided (loud /a/)   Consulted and Agree with Plan of Care Patient      Patient will benefit from skilled therapeutic intervention in order to improve the following deficits and impairments:   Cognitive communication deficit  Dysarthria and anarthria      G-Codes - 11/22/16 07-29-26    Functional Assessment Tool Used noms   Functional Limitations Motor speech   Motor Speech Current Status [phone]) At least 20 percent but less than 40 percent impaired, limited or restricted   Motor Speech Goal Status (E4235) At least 1 percent but less than 20 percent impaired, limited or restricted      Problem List Patient Active Problem List   Diagnosis Date Noted  . Hemiparesis affecting left side as late effect of stroke ([place]) 08/07/2016  . Osteoarthritis of right knee 08/07/2016  . Dysphagia, post-stroke 08/07/2016  . Sleep disturbance 08/07/2016  . Rash and nonspecific skin eruption 08/07/2016  . Left hemiparesis ([place])   . GERD (gastroesophageal reflux disease) 07/23/2016  . Basal ganglia infarction ([place]) 07/21/2016  . Stroke (cerebrum) ([place]) 07/19/2016  . HLD (hyperlipidemia) 08/14/2014  . Obesity (BMI 30-39.9) 08/17/2013  . Hyperlipidemia LDL  goal < 100 02/15/2013  . TIA (transient ischemic attack) 12/14/2012  . Essential hypertension 12/14/2012  . Other and unspecified hyperlipidemia 12/14/2012    [facility] ,[place], [place]  11/19/2016, 12:29 PM  [facility] [address] [place], [place], [address] Phone: [phone]   Fax:  [phone]  Name: Catherine [name] MRN: [id number] Date of Birth: [birth year]

## 2016-11-18 NOTE — Therapy (Signed)
[facility] [address] [place], [place], [address] Phone: [phone]   Fax:  [phone]  Physical Therapy Treatment  Patient Details  Name: Catherine [name] MRN: [id number] Date of Birth: [birth year] Referring Provider: Dr. Jaynee Eagles  Encounter Date: 11/18/2016      PT End of Session - 11/18/16 1746    Visit Number 7   Number of Visits 17   Date for PT Re-Evaluation 12/12/16   Authorization Type Medicare primary; BCBS 2nd-GCODE every 10th visit   PT Start Time 0849   PT Stop Time 0932   PT Time Calculation (min) 43 min   Activity Tolerance Patient tolerated treatment well   Behavior During Therapy [facility] for tasks assessed/performed      Past Medical History:  Diagnosis Date  . Arthritis   . Cataract   . GERD (gastroesophageal reflux disease)   . Stroke ([place])   . TIA (transient ischemic attack)     Past Surgical History:  Procedure Laterality Date  . EYE SURGERY      There were no vitals filed for this visit.      Subjective Assessment - 11/18/16 0851    Subjective My R knee is bothering me.  Probably because of the weather.  I've looked for the insert, but I haven't found the right size.   Pertinent History CVA 07/19/16, R TKR 2008, ?possible Leg length difference with LLE shorter? from hospitalization   Patient Stated Goals Pt's goal for therapy is to get back to normal from prior to the CVA.   Currently in Pain? Yes   Pain Score 3    Pain Location Knee   Pain Orientation Right   Pain Descriptors / Indicators Aching   Pain Type Chronic pain   Pain Frequency Intermittent   Aggravating Factors  weather   Pain Relieving Factors exercise, medication                         [facility] Adult PT Treatment/Exercise - 11/18/16 0856      Self-Care   Self-Care Other Self-Care Comments   Other Self-Care Comments  Reviewed CVA warning signs and symptoms; discussed fall prevention in home  environment.     Exercises   Exercises Other Exercises   Other Exercises  reviewed standing heel cord stretch given last visit     Knee/Hip Exercises: Aerobic   Stepper Seated SciFit Stepper, Level 2, 6 minutes, 4 extremities, for leg strengthening.     Knee/Hip Exercises: Standing   Knee Flexion Strengthening;Right;Left;1 set;10 reps  2#   Hip Abduction Stengthening;Right;Left;1 set;10 reps;Knee straight  2#   Hip Extension Stengthening;Right;Left;1 set;10 reps;Knee straight  2#             Balance Exercises - 11/18/16 0905      Balance Exercises: Standing   Standing Eyes Opened Foam/compliant surface;Head turns;5 reps;Wide (BOA);Narrow base of support (BOS)  Head nods, UE support, 2 sets    Standing Eyes Closed Foam/compliant surface;Wide (BOA);10 secs;1 rep;Narrow base of support (BOS)   SLS with Vectors Solid surface;Upper extremity assist 1;Upper extremity assist 2;Other reps (comment)  Alt. step taps to 6" step forward x 10, side x 10   Rockerboard Anterior/posterior;Head turns  Head nods; hip/ankle strategy x 20; alt UE lifts x 10   Step Ups Forward;6 inch;UE support 2  10 reps bilaterally   Other Standing Exercises On compliant mat surface:  marching in place x 10, then alternating forward  kicks x 10 reps, alternating forward step taps x 10 reps           PT Education - 11/18/16 1745    Education provided Yes   Education Details Reviewed CVA education; provided fall prevention education; discussed POC and plans to continue PT towards LTGs   Person(s) Educated Patient   Methods Explanation;Handout   Comprehension Verbalized understanding          PT Short Term Goals - 11/18/16 1746      PT SHORT TERM GOAL #1   Title Pt will perform HEP independently for improved functional mobility, balance and strength.  TARGET 11/12/16   Time 4   Period Weeks   Status Achieved     PT SHORT TERM GOAL #2   Title Pt will improve TUG score to less than or equal to 15  seconds for decreased fall risk.   Baseline >15 sec with cane; <15 seconds with no device   Time 4   Period Weeks   Status Partially Met     PT SHORT TERM GOAL #3   Title Pt will perform at least 8 of 10 reps of sit<>stand transfers with no UE support for improved transfer efficiency and safety.   Baseline needs UE support   Time 4   Period Weeks   Status Partially Met     PT SHORT TERM GOAL #4   Title Pt will verbalize understanding of CVA warning signs and risk factors.   Time 4   Period Weeks   Status Achieved           PT Long Term Goals - 10/14/16 1632      PT LONG TERM GOAL #1   Title Pt will verbalize understanding of fall prevention in the home environment.  TARGET 12/12/16   Time 8   Period Weeks   Status New     PT LONG TERM GOAL #2   Title Pt will improve TUG score to less than or equal to 13.5 seconds for decreased fall risk.   Time 8   Period Weeks   Status New     PT LONG TERM GOAL #3   Title Pt will improve Dynamic Gait Index score to at least 19/24 for decreased fall risk.   Time 8   Period Weeks   Status New     PT LONG TERM GOAL #4   Title Pt will improve gait velocity to at least 2.3 ft/sec for improved gait efficiency and safety.   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   Time 8   Period Weeks   Status New               Plan - 11/18/16 1747    Clinical Impression Statement STG 1 and 4 assessed and met today.  Pt feels she is making progress with PT, but feels continuing PT is best option.  Pt comes to therapy without cane today; with exercises and gait training, pt appears to have decreased Trendelenburg-type gait pattern on LLE.  Pt's limitation mostly has come from pain in R knee (longstanding R knee pain, and PT has encouraged pt to follow up with orthopedist who has seen her  in the past).  Pt will continue to benefit from skilled PT for  improved strength, balance,a nd gait.   Rehab Potential Good   PT Frequency 2x / week   PT Duration 8 weeks  plus eval   PT Treatment/Interventions ADLs/Self Care Home Management;Gait training;DME Instruction;Neuromuscular re-education;Balance training;Therapeutic exercise;Functional mobility training;Therapeutic activities   PT Next Visit Plan Continue compliant surfaces with gait and balance; LLE strengthening and balance   Consulted and Agree with Plan of Care Patient      Patient will benefit from skilled therapeutic intervention in order to improve the following deficits and impairments:  Abnormal gait, Decreased balance, Decreased mobility, Decreased knowledge of use of DME, Difficulty walking, Decreased strength, Pain  Visit Diagnosis: Muscle weakness (generalized)  Unsteadiness on feet     Problem List Patient Active Problem List   Diagnosis Date Noted  . Hemiparesis affecting left side as late effect of stroke ([place]) 08/07/2016  . Osteoarthritis of right knee 08/07/2016  . Dysphagia, post-stroke 08/07/2016  . Sleep disturbance 08/07/2016  . Rash and nonspecific skin eruption 08/07/2016  . Left hemiparesis ([place])   . GERD (gastroesophageal reflux disease) 07/23/2016  . Basal ganglia infarction ([place]) 07/21/2016  . Stroke (cerebrum) ([place]) 07/19/2016  . HLD (hyperlipidemia) 08/14/2014  . Obesity (BMI 30-39.9) 08/17/2013  . Hyperlipidemia LDL goal < 100 02/15/2013  . TIA (transient ischemic attack) 12/14/2012  . Essential hypertension 12/14/2012  . Other and unspecified hyperlipidemia 12/14/2012    Zyier Dykema W. 11/18/2016, 5:50 PM  Frazier Butt., PT   [facility] [address] [place], [place], [address] Phone: [phone]   Fax:  [phone]  Name: Catherine [name] MRN: [id number] Date of Birth: May 23, 1945

## 2016-11-18 NOTE — Therapy (Signed)
[facility] [address] [place], [place], [address] Phone: [phone]   Fax:  [phone]  Occupational Therapy Treatment  Patient Details  Name: Catherine [name] MRN: [id number] Date of Birth: 03/02/1946 Referring Provider: Dr. Sarina Ill  Encounter Date: 11/18/2016      OT End of Session - 11/18/16 1113    Visit Number 7   Number of Visits 17   Date for OT Re-Evaluation 12/12/16   Authorization Type MCR/BCBS - G code needed   Authorization - Visit Number 7   Authorization - Number of Visits 10   OT Start Time 0930   OT Stop Time 1015   OT Time Calculation (min) 45 min   Activity Tolerance Patient tolerated treatment well      Past Medical History:  Diagnosis Date  . Arthritis   . Cataract   . GERD (gastroesophageal reflux disease)   . Stroke ([place])   . TIA (transient ischemic attack)     Past Surgical History:  Procedure Laterality Date  . EYE SURGERY      There were no vitals filed for this visit.      Subjective Assessment - 11/18/16 0936    Subjective  I'm back to doing all the cooking and cleaning   Pertinent History 07/19/16: CVA.  PMH: TIA 2014, OA, GERD, Rt TKR 2008   Limitations No lifting > 10 lbs   Patient Stated Goals improve fine motor skills   Currently in Pain? No/denies  None in UE's                      OT Treatments/Exercises (OP) - 11/18/16 0001      ADLs   ADL Comments Pt carrying cup of water while ambulating and performing cognitive task (category generation) w/o spills and greater ease. Pt also performing category generation well (with only 1-2 cues)     Fine Motor Coordination   Fine Motor Coordination Small Pegboard   Small Pegboard Pt placing small pegs in pegboard Lt hand with only min difficulty Lt hand, while copying peg design with 1 self corrected error   Other Fine Motor Exercises Translating stress balls in Lt hand with max difficulty, min  assist from Rt hand occasioanlly to prevent dropping on ulnar side. Pt improved with repetition     Functional Reaching Activities   Mid Level to place 2 lb. object on shelf x 5 trials LUE.    High Level to retrieve/replace cones for high level reaching LUE                  OT Short Term Goals - 11/18/16 1113      OT SHORT TERM GOAL #1   Title Independent with coordination and putty HEP for Lt hand (STG's due 11/12/16)    Time 4   Period Weeks   Status Achieved     OT SHORT TERM GOAL #2   Title Pt to be independent with strengthening HEP for LUE   Time 4   Period Weeks   Status Achieved     OT SHORT TERM GOAL #3   Title Pt to attend to Lt side with simple multi-tasking activity while carrying plate and/or cup of water w/o drops/spills Lt hand   Time 4   Period Weeks   Status Achieved     OT SHORT TERM GOAL #4   Title Pt to perform high level LUE reaching to retrieve/replace 2 lb. object from  high shelf with min compensations    Time 4   Period Weeks   Status On-going  Pt currently able to perform at midrange           OT Long Term Goals - 10/13/16 1413      OT LONG TERM GOAL #1   Title Improve coordination Lt hand as evidenced by reducing speed on 9 hole peg test to 28 sec. or less (LTG's due 12/12/16)   Baseline 35.72 sec. (Rt = 21.25 sec)    Time 8   Period Weeks   Status New     OT LONG TERM GOAL #2   Title Improve grip strength Lt hand to 50 lbs or greater to assist with opening jars, containers, etc   Baseline eval = 45 lbs (Rt = 60 lbs)    Time 8   Period Weeks   Status New     OT LONG TERM GOAL #3   Title Pt to retrieve/replace 5 lb object from overheadl shelf LUE 5/5 trials with no compensations   Time 8   Period Weeks   Status New               Plan - 11/18/16 1114    Clinical Impression Statement Pt met 3/4 STG's and approximating remaining goal. Pt reports less drops overall from Lt hand, and coordination continuing to improve    Rehab Potential Good   OT Frequency 2x / week   OT Duration 8 weeks   OT Treatment/Interventions Self-care/ADL training;DME and/or AE instruction;Patient/family education;Therapeutic exercises;Therapeutic activities;Neuromuscular education;Functional Mobility Training;Passive range of motion;Visual/perceptual remediation/compensation   Plan gross motor coordination, in hand manipulation, UE strength/endurance (reaching activities, folding laundry, UBE)    OT Home Exercise Plan Education provided:  Red putty/Coordination HEP, strengthening HEP LUE      Patient will benefit from skilled therapeutic intervention in order to improve the following deficits and impairments:  Decreased coordination, Decreased range of motion, Impaired flexibility, Improper body mechanics, Impaired sensation, Decreased endurance, Decreased activity tolerance, Impaired UE functional use, Decreased mobility, Decreased strength, Impaired perceived functional ability  Visit Diagnosis: Other lack of coordination  Muscle weakness (generalized)    Problem List Patient Active Problem List   Diagnosis Date Noted  . Hemiparesis affecting left side as late effect of stroke ([place]) 08/07/2016  . Osteoarthritis of right knee 08/07/2016  . Dysphagia, post-stroke 08/07/2016  . Sleep disturbance 08/07/2016  . Rash and nonspecific skin eruption 08/07/2016  . Left hemiparesis ([place])   . GERD (gastroesophageal reflux disease) 07/23/2016  . Basal ganglia infarction ([place]) 07/21/2016  . Stroke (cerebrum) ([place]) 07/19/2016  . HLD (hyperlipidemia) 08/14/2014  . Obesity (BMI 30-39.9) 08/17/2013  . Hyperlipidemia LDL goal < 100 02/15/2013  . TIA (transient ischemic attack) 12/14/2012  . Essential hypertension 12/14/2012  . Other and unspecified hyperlipidemia 12/14/2012    Carey Bullocks, OTR/L 11/18/2016, 11:16 AM  [facility] [address] [place],  [place], [address] Phone: [phone]   Fax:  [phone]  Name: Catherine [name] MRN: [id number] Date of Birth: March 29, 1946

## 2016-11-18 NOTE — Patient Instructions (Addendum)
  Do 7 loud "ah"s twice each day with GOOD voice support  ==========================================================================  Voice strengthening exercise: Say "HUH!" loudly, holding your breath for three seconds - repeat 20 times, twice a day

## 2016-11-19 NOTE — Addendum Note (Signed)
Addended by: Garald Balding B on: 11/19/2016 12:32 PM   Modules accepted: Orders

## 2016-11-23 ENCOUNTER — Ambulatory Visit: Payer: Medicare Other | Admitting: Physical Therapy

## 2016-11-23 ENCOUNTER — Ambulatory Visit: Payer: Medicare Other | Admitting: Occupational Therapy

## 2016-11-23 ENCOUNTER — Ambulatory Visit: Payer: Medicare Other

## 2016-11-23 DIAGNOSIS — M6281 Muscle weakness (generalized): Secondary | ICD-10-CM

## 2016-11-23 DIAGNOSIS — R41841 Cognitive communication deficit: Secondary | ICD-10-CM | POA: Diagnosis not present

## 2016-11-23 DIAGNOSIS — R471 Dysarthria and anarthria: Secondary | ICD-10-CM

## 2016-11-23 DIAGNOSIS — R278 Other lack of coordination: Secondary | ICD-10-CM

## 2016-11-23 DIAGNOSIS — R2681 Unsteadiness on feet: Secondary | ICD-10-CM | POA: Diagnosis not present

## 2016-11-23 DIAGNOSIS — R2689 Other abnormalities of gait and mobility: Secondary | ICD-10-CM | POA: Diagnosis not present

## 2016-11-23 NOTE — Therapy (Signed)
[facility] [address] [place], [place], [address] Phone: [phone]   Fax:  [phone]  Physical Therapy Treatment  Patient Details  Name: Catherine [name] MRN: [id number] Date of Birth: [birth year] Referring Provider: Dr. Jaynee Eagles  Encounter Date: 11/23/2016      PT End of Session - 11/23/16 1707    Visit Number 8   Number of Visits 17   Date for PT Re-Evaluation 12/12/16   Authorization Type Medicare primary; BCBS 2nd-GCODE every 10th visit   PT Start Time 1020   PT Stop Time 1101   PT Time Calculation (min) 41 min   Equipment Utilized During Treatment Gait belt   Activity Tolerance Patient tolerated treatment well   Behavior During Therapy [facility] for tasks assessed/performed      Past Medical History:  Diagnosis Date  . Arthritis   . Cataract   . GERD (gastroesophageal reflux disease)   . Stroke ([place])   . TIA (transient ischemic attack)     Past Surgical History:  Procedure Laterality Date  . EYE SURGERY      There were no vitals filed for this visit.      Subjective Assessment - 11/23/16 1021    Subjective No changes since the last visit.  Found an insert, but I'm not sure that is right.  My walking seems to be better.   Pertinent History CVA 07/19/16, R TKR 2008, ?possible Leg length difference with LLE shorter? from hospitalization   Patient Stated Goals Pt's goal for therapy is to get back to normal from prior to the CVA.   Currently in Pain? Yes   Pain Score 3    Pain Location Knee   Pain Orientation Right   Pain Descriptors / Indicators Aching   Pain Type Chronic pain   Pain Onset More than a month ago   Pain Frequency Intermittent   Aggravating Factors  weather, walking   Pain Relieving Factors exercise, medication                         [facility] Adult PT Treatment/Exercise - 11/23/16 1025      Transfers   Transfers Sit to Stand;Stand to Sit   Sit to Stand 6: Modified  independent (Device/Increase time);Without upper extremity assist;From bed;From chair/3-in-1;With upper extremity assist   Stand to Sit 6: Modified independent (Device/Increase time);Without upper extremity assist;With upper extremity assist;To bed;To chair/3-in-1   Number of Reps Other reps (comment)  5 reps from 20", 3 reps from 18"     Ambulation/Gait   Gait Comments Did not focus directly on gait during today's session, but briefly assessed pt's shoe insert for L shoe-insert has high arch and is thicker than sample in clinic.  Pt feels it is bothering her knee more, so advised paitent to take it out.  No further c/o in R knee during rest of session.     Knee/Hip Exercises: Plyometrics   6 Meter Hop Limitations --     Knee/Hip Exercises: Standing   Knee Flexion Strengthening;Right;Left;10 reps;2 sets  2#   Hip Abduction Stengthening;Right;Left;10 reps;Knee straight;2 sets  2#   Hip Extension Stengthening;Right;Left;10 reps;2 sets  2 #             Balance Exercises - 11/23/16 1702      Balance Exercises: Standing   Rockerboard Anterior/posterior;Head turns  Head nods, hip/ankle strategy x 20, alt UE swing   Tandem Gait Forward;Intermittent upper extremity support;Foam/compliant surface;3  reps  Length of counter and back on blue mat   Retro Gait Foam/compliant surface;3 reps  Forward/back, length of counter and back, blue mat   Sidestepping Foam/compliant support;3 reps  length of counter and back, blue mat   Other Standing Exercises On compliant surface, side stepping, sidestepping with head turns, then sidestepping taps to cones, sidestepping taps to knock over cones, with min guard assistance; squats to pick up cones and reach to give cones to therapist.  No LOB noted.             PT Short Term Goals - 11/18/16 1746      PT SHORT TERM GOAL #1   Title Pt will perform HEP independently for improved functional mobility, balance and strength.  TARGET 11/12/16   Time 4    Period Weeks   Status Achieved     PT SHORT TERM GOAL #2   Title Pt will improve TUG score to less than or equal to 15 seconds for decreased fall risk.   Baseline >15 sec with cane; <15 seconds with no device   Time 4   Period Weeks   Status Partially Met     PT SHORT TERM GOAL #3   Title Pt will perform at least 8 of 10 reps of sit<>stand transfers with no UE support for improved transfer efficiency and safety.   Baseline needs UE support   Time 4   Period Weeks   Status Partially Met     PT SHORT TERM GOAL #4   Title Pt will verbalize understanding of CVA warning signs and risk factors.   Time 4   Period Weeks   Status Achieved           PT Long Term Goals - 10/14/16 1632      PT LONG TERM GOAL #1   Title Pt will verbalize understanding of fall prevention in the home environment.  TARGET 12/12/16   Time 8   Period Weeks   Status New     PT LONG TERM GOAL #2   Title Pt will improve TUG score to less than or equal to 13.5 seconds for decreased fall risk.   Time 8   Period Weeks   Status New     PT LONG TERM GOAL #3   Title Pt will improve Dynamic Gait Index score to at least 19/24 for decreased fall risk.   Time 8   Period Weeks   Status New     PT LONG TERM GOAL #4   Title Pt will improve gait velocity to at least 2.3 ft/sec for improved gait efficiency and safety.   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   Time 8   Period Weeks   Status New               Plan - 11/23/16 1708    Clinical Impression Statement Continued to focus on compliant surace balance work and lower extremity strengthening.  Pt again comes to therapy without cane, but overall, her gait pattern appears smoother.  Tends to be affected by R kne pain.  Pt will continue to benefit from skilled PT to address balance, strength,  and gait.   Rehab Potential Good   PT Frequency 2x / week   PT Duration  8 weeks  plus eval   PT Treatment/Interventions ADLs/Self Care Home Management;Gait training;DME Instruction;Neuromuscular re-education;Balance training;Therapeutic exercise;Functional mobility training;Therapeutic activities   PT Next Visit Plan Continue hip stregnthening, lower extremity strengthening with weights vs theraband to add to HEP; compliant surface, balance activities.   Consulted and Agree with Plan of Care Patient      Patient will benefit from skilled therapeutic intervention in order to improve the following deficits and impairments:  Abnormal gait, Decreased balance, Decreased mobility, Decreased knowledge of use of DME, Difficulty walking, Decreased strength, Pain  Visit Diagnosis: Muscle weakness (generalized)  Unsteadiness on feet     Problem List Patient Active Problem List   Diagnosis Date Noted  . Hemiparesis affecting left side as late effect of stroke ([place]) 08/07/2016  . Osteoarthritis of right knee 08/07/2016  . Dysphagia, post-stroke 08/07/2016  . Sleep disturbance 08/07/2016  . Rash and nonspecific skin eruption 08/07/2016  . Left hemiparesis ([place])   . GERD (gastroesophageal reflux disease) 07/23/2016  . Basal ganglia infarction ([place]) 07/21/2016  . Stroke (cerebrum) ([place]) 07/19/2016  . HLD (hyperlipidemia) 08/14/2014  . Obesity (BMI 30-39.9) 08/17/2013  . Hyperlipidemia LDL goal < 100 02/15/2013  . TIA (transient ischemic attack) 12/14/2012  . Essential hypertension 12/14/2012  . Other and unspecified hyperlipidemia 12/14/2012    MARRIOTT,AMY W. 11/23/2016, 5:10 PM Frazier Butt., PT  [facility] [address] [place], [place], [address] Phone: [phone]   Fax:  [phone]  Name: Catherine [name] MRN: [id number] Date of Birth: 10-22-1945

## 2016-11-23 NOTE — Therapy (Signed)
[facility] [address] [place], [place], [address] Phone: [phone]   Fax:  [phone]  Speech Language Pathology Treatment  Patient Details  Name: Catherine [name] MRN: [id number] Date of Birth: 1945-09-16 Referring Provider: Sarina Ill, MD  Encounter Date: 11/23/2016      End of Session - 11/23/16 0953    Visit Number 2   Number of Visits 17   Date for SLP Re-Evaluation 01/22/17   SLP Start Time 0932   SLP Stop Time  2353   SLP Time Calculation (min) 43 min   Activity Tolerance Patient tolerated treatment well      Past Medical History:  Diagnosis Date  . Arthritis   . Cataract   . GERD (gastroesophageal reflux disease)   . Stroke ([place])   . TIA (transient ischemic attack)     Past Surgical History:  Procedure Laterality Date  . EYE SURGERY      There were no vitals filed for this visit.      Subjective Assessment - 11/23/16 0942    Subjective Pt reports she uses exaggerated speech when she feels speech is more slurred.   Currently in Pain? No/denies               ADULT SLP TREATMENT - 11/23/16 0943      General Information   Behavior/Cognition Alert;Cooperative;Pleasant mood     Treatment Provided   Treatment provided Cognitive-Linquistic     Cognitive-Linquistic Treatment   Treatment focused on Dysarthria   Skilled Treatment SLP educated pt abdominal breathing (AB) and practiced at rest and in simple conversation - initial cues needed for practice at rest faded to independent, and occasional min A needed for simple conversation. Conversation prior to [employer] with suboptimal loudness/effort. at upper 60sdB.     Assessment / Recommendations / Plan   Plan Continue with current plan of care     Progression Toward Goals   Progression toward goals Progressing toward goals          SLP Education - 11/23/16 1021    Education provided Yes   Education Details abdominal  breathing (AB)   Person(s) Educated Patient   Methods Explanation;Demonstration;Verbal cues   Comprehension Returned demonstration;Verbal cues required;Verbalized understanding          SLP Short Term Goals - 11/23/16 0937      SLP SHORT TERM GOAL #1   Title pt will use abdominal breathing (AB) at rest 85% of the time over 5 minute period x3 sessions   Time 4   Period Weeks   Status On-going     SLP SHORT TERM GOAL #2   Title pt will demo AB in 14/20 sentence responses x2 sessions   Time 4   Period Weeks   Status On-going     SLP SHORT TERM GOAL #3   Title pt will produce sentences with low 70s dB average loudness 80% of the time   Time 4   Period Weeks   Status On-going     SLP SHORT TERM GOAL #4   Title pt will undergo cognitive linguistic assessment    Time 2   Period Weeks   Status On-going          SLP Long Term Goals - 11/23/16 6144      SLP LONG TERM GOAL #1   Title pt will demo AB 70% of the time in 10 minutes simple conversaiton x3 visits   Time 8  Period Weeks   Status On-going     SLP LONG TERM GOAL #2   Title pt will improve conversational loudness to low 70s dB average, in 10 minutes simple conversation  x3   Baseline pt rated voice 5-6/10 on eval   Time 8   Period Weeks   Status On-going     SLP LONG TERM GOAL #3   Title pt will demo adequate respiratory support 80% of the time in 7 minutes mod complex conversation   Time 8   Period Weeks   Status On-going          Plan - 11/23/16 1023    Clinical Impression Statement Pt presents today with cont'd weak/soft voice beginning in simple conversation at upper 60s dB. 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    Speech Therapy Frequency 2x / week   Duration --  8 weeks or 17 total visits   Treatment/Interventions Compensatory strategies;Patient/family education;Functional tasks;SLP instruction and feedback;Multimodal communcation approach;Cognitive reorganization;Environmental controls  any or all may be used   Potential to Achieve Goals Good   SLP Home Exercise Plan provided (loud /a/)   Consulted and Agree with Plan of Care Patient      Patient will benefit from skilled therapeutic intervention in order to improve the following deficits and impairments:   Dysarthria and anarthria  Cognitive communication deficit    Problem List Patient Active Problem List   Diagnosis Date Noted  . Hemiparesis affecting left side as late effect of stroke ([place]) 08/07/2016  . Osteoarthritis of right knee 08/07/2016  . Dysphagia, post-stroke 08/07/2016  . Sleep disturbance 08/07/2016  . Rash and nonspecific skin eruption 08/07/2016  . Left hemiparesis ([place])   . GERD (gastroesophageal reflux disease) 07/23/2016  . Basal ganglia infarction ([place]) 07/21/2016  . Stroke (cerebrum) ([place]) 07/19/2016  . HLD (hyperlipidemia) 08/14/2014  . Obesity (BMI 30-39.9) 08/17/2013  . Hyperlipidemia LDL goal < 100 02/15/2013  . TIA (transient ischemic attack) 12/14/2012  . Essential hypertension 12/14/2012  . Other and unspecified hyperlipidemia 12/14/2012    [facility] ,[place], [place]  11/23/2016, 10:35 AM  [facility] [address] [place], [place], [address] Phone: [phone]   Fax:  [phone]   Name: Catherine [name] MRN: [id number] Date of Birth: 1945/07/09

## 2016-11-23 NOTE — Therapy (Signed)
[facility] [address] [place], [place], [address] Phone: [phone]   Fax:  [phone]  Occupational Therapy Treatment  Patient Details  Name: Catherine [name] MRN: [id number] Date of Birth: [birth year] Referring Provider: Dr. Sarina Ill  Encounter Date: 11/23/2016      OT End of Session - 11/23/16 1140    Visit Number 8   Number of Visits 17   Date for OT Re-Evaluation 12/12/16   Authorization Type MCR/BCBS - G code needed   Authorization - Visit Number 8   Authorization - Number of Visits 10   OT Start Time 1105   OT Stop Time 1145   OT Time Calculation (min) 40 min   Activity Tolerance Patient tolerated treatment well      Past Medical History:  Diagnosis Date  . Arthritis   . Cataract   . GERD (gastroesophageal reflux disease)   . Stroke ([place])   . TIA (transient ischemic attack)     Past Surgical History:  Procedure Laterality Date  . EYE SURGERY      There were no vitals filed for this visit.      Subjective Assessment - 11/23/16 1102    Pertinent History 07/19/16: CVA.  PMH: TIA 2014, OA, GERD, Rt TKR 2008   Limitations No lifting > 10 lbs   Patient Stated Goals improve fine motor skills   Currently in Pain? No/denies  in UE's (see P.T. note for LE's - O.T. not addressing)                      OT Treatments/Exercises (OP) - 11/23/16 1103      ADLs   Home Maintenance Folding clothes and towels for UE strength/endurance, then carried full laundry basket back to room     Shoulder Exercises: ROM/Strengthening   UBE (Upper Arm Bike) UBE x 8 min. Level 3     Hand Exercises   Other Hand Exercises Gripper at level 2 resistance to pick up blocks Lt hand for sustained grip strength. Pt with mod difficulty and drops. Pt required 1 rest break.      Fine Motor Coordination   Other Fine Motor Exercises Translating stress balls in Lt hand with max difficulty, min assist from Rt  hand occasioanlly to prevent dropping on ulnar side. Pt improved with repetition     Functional Reaching Activities   High Level High level reaching LUE to place clothespins on antenna, then removed clothespins with 2 lb. wrist weight on LUE and mod cues for proper positioning. Pt fatigued quickly with weight on LUE                  OT Short Term Goals - 11/18/16 1113      OT SHORT TERM GOAL #1   Title Independent with coordination and putty HEP for Lt hand (STG's due 11/12/16)    Time 4   Period Weeks   Status Achieved     OT SHORT TERM GOAL #2   Title Pt to be independent with strengthening HEP for LUE   Time 4   Period Weeks   Status Achieved     OT SHORT TERM GOAL #3   Title Pt to attend to Lt side with simple multi-tasking activity while carrying plate and/or cup of water w/o drops/spills Lt hand   Time 4   Period Weeks   Status Achieved     OT SHORT TERM GOAL #4   Title  Pt to perform high level LUE reaching to retrieve/replace 2 lb. object from high shelf with min compensations    Time 4   Period Weeks   Status On-going  Pt currently able to perform at midrange           OT Long Term Goals - 10/13/16 1413      OT LONG TERM GOAL #1   Title Improve coordination Lt hand as evidenced by reducing speed on 9 hole peg test to 28 sec. or less (LTG's due 12/12/16)   Baseline 35.72 sec. (Rt = 21.25 sec)    Time 8   Period Weeks   Status New     OT LONG TERM GOAL #2   Title Improve grip strength Lt hand to 50 lbs or greater to assist with opening jars, containers, etc   Baseline eval = 45 lbs (Rt = 60 lbs)    Time 8   Period Weeks   Status New     OT LONG TERM GOAL #3   Title Pt to retrieve/replace 5 lb object from overheadl shelf LUE 5/5 trials with no compensations   Time 8   Period Weeks   Status New               Plan - 11/23/16 1141    Clinical Impression Statement Pt with improved functional use and endurance LUE.    Rehab Potential  Good   OT Frequency 2x / week   OT Duration 8 weeks   OT Treatment/Interventions Self-care/ADL training;DME and/or AE instruction;Patient/family education;Therapeutic exercises;Therapeutic activities;Neuromuscular education;Functional Mobility Training;Passive range of motion;Visual/perceptual remediation/compensation   Plan gross motor coordination, in hand manipulation, functional reaching with weight LUE   OT Home Exercise Plan Education provided:  Red putty/Coordination HEP, strengthening HEP LUE   Consulted and Agree with Plan of Care Patient      Patient will benefit from skilled therapeutic intervention in order to improve the following deficits and impairments:  Decreased coordination, Decreased range of motion, Impaired flexibility, Improper body mechanics, Impaired sensation, Decreased endurance, Decreased activity tolerance, Impaired UE functional use, Decreased mobility, Decreased strength, Impaired perceived functional ability  Visit Diagnosis: Muscle weakness (generalized)  Other lack of coordination    Problem List Patient Active Problem List   Diagnosis Date Noted  . Hemiparesis affecting left side as late effect of stroke ([place]) 08/07/2016  . Osteoarthritis of right knee 08/07/2016  . Dysphagia, post-stroke 08/07/2016  . Sleep disturbance 08/07/2016  . Rash and nonspecific skin eruption 08/07/2016  . Left hemiparesis ([place])   . GERD (gastroesophageal reflux disease) 07/23/2016  . Basal ganglia infarction ([place]) 07/21/2016  . Stroke (cerebrum) ([place]) 07/19/2016  . HLD (hyperlipidemia) 08/14/2014  . Obesity (BMI 30-39.9) 08/17/2013  . Hyperlipidemia LDL goal < 100 02/15/2013  . TIA (transient ischemic attack) 12/14/2012  . Essential hypertension 12/14/2012  . Other and unspecified hyperlipidemia 12/14/2012    Carey Bullocks, OTR/L 11/23/2016, 11:43 AM  [facility] [address] [place],  [place], [address] Phone: [phone]   Fax:  [phone]  Name: JONIKA CRITZ MRN: [id number] Date of Birth: Feb 20, 1946

## 2016-11-25 DIAGNOSIS — E785 Hyperlipidemia, unspecified: Secondary | ICD-10-CM | POA: Diagnosis not present

## 2016-11-26 ENCOUNTER — Ambulatory Visit: Payer: Medicare Other | Admitting: Occupational Therapy

## 2016-11-26 ENCOUNTER — Ambulatory Visit: Payer: Medicare Other | Attending: Neurology | Admitting: Physical Therapy

## 2016-11-26 ENCOUNTER — Ambulatory Visit: Payer: Medicare Other

## 2016-11-26 DIAGNOSIS — M6281 Muscle weakness (generalized): Secondary | ICD-10-CM

## 2016-11-26 DIAGNOSIS — R471 Dysarthria and anarthria: Secondary | ICD-10-CM | POA: Diagnosis not present

## 2016-11-26 DIAGNOSIS — R278 Other lack of coordination: Secondary | ICD-10-CM | POA: Diagnosis not present

## 2016-11-26 DIAGNOSIS — R2681 Unsteadiness on feet: Secondary | ICD-10-CM

## 2016-11-26 DIAGNOSIS — R208 Other disturbances of skin sensation: Secondary | ICD-10-CM | POA: Diagnosis not present

## 2016-11-26 DIAGNOSIS — R41842 Visuospatial deficit: Secondary | ICD-10-CM | POA: Insufficient documentation

## 2016-11-26 DIAGNOSIS — R2689 Other abnormalities of gait and mobility: Secondary | ICD-10-CM

## 2016-11-26 NOTE — Therapy (Signed)
[facility] [address] [place], [place], [address] Phone: [phone]   Fax:  [phone]  Speech Language Pathology Treatment  Patient Details  Name: Catherine [name] MRN: [id number] Date of Birth: 1945-07-01 Referring Provider: Sarina Ill, MD  Encounter Date: 11/26/2016      End of Session - 11/26/16 1635    Visit Number 3   Number of Visits 17   Date for SLP Re-Evaluation 01/22/17   SLP Start Time 44   SLP Stop Time  1100   SLP Time Calculation (min) 42 min   Activity Tolerance Patient tolerated treatment well      Past Medical History:  Diagnosis Date  . Arthritis   . Cataract   . GERD (gastroesophageal reflux disease)   . Stroke ([place])   . TIA (transient ischemic attack)     Past Surgical History:  Procedure Laterality Date  . EYE SURGERY      There were no vitals filed for this visit.      Subjective Assessment - 11/26/16 1022    Subjective "I've been busy, getting all my appointments.Marland KitchenMarland KitchenMarland KitchenNext week is dermatologist."   Currently in Pain? No/denies               ADULT SLP TREATMENT - 11/26/16 1023      General Information   Behavior/Cognition Alert;Cooperative;Pleasant mood     Treatment Provided   Treatment provided Cognitive-Linquistic     Cognitive-Linquistic Treatment   Treatment focused on Dysarthria   Skilled Treatment SLP educated pt re: vocal ROM, and reviewed vocal strengthening exercises given pt's complaints of weaker voice. SLP measured her voice today at low 70s dB average in min-mod complex conversation for 20 minutes.      Assessment / Recommendations / Plan   Plan Continue with current plan of care     Progression Toward Goals   Progression toward goals Progressing toward goals          SLP Education - 11/26/16 1058    Education provided Yes   Education Details HEP for voice strengthening and range   Person(s) Educated Patient   Methods  Explanation;Demonstration;Verbal cues;Handout   Comprehension Verbalized understanding;Returned demonstration;Verbal cues required;Need further instruction          SLP Short Term Goals - 11/26/16 1637      SLP SHORT TERM GOAL #1   Title pt will use abdominal breathing (AB) at rest 85% of the time over 5 minute period x3 sessions   Time 4   Period Weeks   Status On-going     SLP SHORT TERM GOAL #2   Title pt will demo AB in 14/20 sentence responses x2 sessions   Time 4   Period Weeks   Status On-going     SLP SHORT TERM GOAL #3   Title pt will produce sentences with low 70s dB average loudness 80% of the time over 2 visits   Baseline 11-26-16   Time 4   Period Weeks   Status Revised     SLP SHORT TERM GOAL #4   Title pt will undergo cognitive linguistic assessment    Time --   Period --   Status Deferred  pt shown WNL cognitive linguistics during therapy tasks          SLP Long Term Goals - 11/26/16 1639      SLP LONG TERM GOAL #1   Title pt will demo AB 70% of the time in 10 minutes simple  conversaiton x3 visits   Time 8   Period Weeks   Status On-going     SLP LONG TERM GOAL #2   Title pt will improve conversational loudness to low 70s dB average, in 10 minutes simple conversation  x3   Baseline pt rated voice 5-6/10 on eval   Time 8   Period Weeks   Status On-going     SLP LONG TERM GOAL #3   Title pt will demo adequate respiratory support 80% of the time in 7 minutes mod complex conversation   Time 8   Period Weeks   Status On-going          Plan - 11/26/16 1635    Clinical Impression Statement Pt presents today with voice today in simple-mod copmlex conversation at lower 70s dB. SLP believes pt's cognitive-lingjistic skills are WNL. Skilled ST is beneficial to provide her with opportunity to maintain improvement with her conversational volume. Likely decr to x1/week in next 1-3 visits.   Speech Therapy Frequency 2x / week   Duration --  8 weeks  or 17 total visits   Treatment/Interventions Compensatory strategies;Patient/family education;Functional tasks;SLP instruction and feedback;Multimodal communcation approach;Cognitive reorganization;Environmental controls  any or all may be used   Potential to Achieve Goals Good   SLP Home Exercise Plan provided (loud /a/)   Consulted and Agree with Plan of Care Patient      Patient will benefit from skilled therapeutic intervention in order to improve the following deficits and impairments:   Dysarthria and anarthria    Problem List Patient Active Problem List   Diagnosis Date Noted  . Hemiparesis affecting left side as late effect of stroke ([place]) 08/07/2016  . Osteoarthritis of right knee 08/07/2016  . Dysphagia, post-stroke 08/07/2016  . Sleep disturbance 08/07/2016  . Rash and nonspecific skin eruption 08/07/2016  . Left hemiparesis ([place])   . GERD (gastroesophageal reflux disease) 07/23/2016  . Basal ganglia infarction ([place]) 07/21/2016  . Stroke (cerebrum) ([place]) 07/19/2016  . HLD (hyperlipidemia) 08/14/2014  . Obesity (BMI 30-39.9) 08/17/2013  . Hyperlipidemia LDL goal < 100 02/15/2013  . TIA (transient ischemic attack) 12/14/2012  . Essential hypertension 12/14/2012  . Other and unspecified hyperlipidemia 12/14/2012    [facility] ,[place], [place]  11/26/2016, 4:39 PM  [facility] [address] [place], [place], [address] Phone: [phone]   Fax:  [phone]   Name: Catherine [name] MRN: [id number] Date of Birth: [birth year]

## 2016-11-26 NOTE — Therapy (Signed)
Catherine [name] [address] [place], [place], [address] Phone: [phone]   Fax:  [phone]  Occupational Therapy Treatment  Patient Details  Name: Catherine [name] MRN: [id number] Date of Birth: 06-Feb-1946 Referring Provider: Dr. Sarina Ill  Encounter Date: 11/26/2016      OT End of Session - 11/26/16 1137    Visit Number 9   Number of Visits 17   Date for OT Re-Evaluation 12/12/16   Authorization Type MCR/BCBS - G code needed   Authorization - Visit Number 9   Authorization - Number of Visits 10   OT Start Time 0930   OT Stop Time 1010   OT Time Calculation (min) 40 min   Activity Tolerance Patient tolerated treatment well      Past Medical History:  Diagnosis Date  . Arthritis   . Cataract   . GERD (gastroesophageal reflux disease)   . Stroke ([place])   . TIA (transient ischemic attack)     Past Surgical History:  Procedure Laterality Date  . EYE SURGERY      There were no vitals filed for this visit.      Subjective Assessment - 11/26/16 0931    Pertinent History 07/19/16: CVA.  PMH: TIA 2014, OA, GERD, Rt TKR 2008   Limitations No lifting > 10 lbs   Patient Stated Goals improve fine motor skills   Currently in Pain? No/denies                      OT Treatments/Exercises (OP) - 11/26/16 0001      Fine Motor Coordination   Fine Motor Coordination Manipulating coins   Manipulating coins in hand manipulation and translation for up to 5 coins at a time   Other Fine Motor Exercises Gross motor coordination: tossing ball Rt hand, then Lt hand with min drops Rt hand, mod drops Lt hand however improved accuracy and less drops with repetition     Functional Reaching Activities   High Level Placing and retrieving 5 lb. object from high shelf BUE's. Placing and retrieving 2 lb. object from high shelf LUE only. Then performed high level sh. flexion x 10 reps with 2 lb. weight in each arm                   OT Short Term Goals - 11/18/16 1113      OT SHORT TERM GOAL #1   Title Independent with coordination and putty HEP for Lt hand (STG's due 11/12/16)    Time 4   Period Weeks   Status Achieved     OT SHORT TERM GOAL #2   Title Pt to be independent with strengthening HEP for LUE   Time 4   Period Weeks   Status Achieved     OT SHORT TERM GOAL #3   Title Pt to attend to Lt side with simple multi-tasking activity while carrying plate and/or cup of water w/o drops/spills Lt hand   Time 4   Period Weeks   Status Achieved     OT SHORT TERM GOAL #4   Title Pt to perform high level LUE reaching to retrieve/replace 2 lb. object from high shelf with min compensations    Time 4   Period Weeks   Status On-going  Pt currently able to perform at midrange           OT Long Term Goals - 10/13/16 1413      OT  LONG TERM GOAL #1   Title Improve coordination Lt hand as evidenced by reducing speed on 9 hole peg test to 28 sec. or less (LTG's due 12/12/16)   Baseline 35.72 sec. (Rt = 21.25 sec)    Time 8   Period Weeks   Status New     OT LONG TERM GOAL #2   Title Improve grip strength Lt hand to 50 lbs or greater to assist with opening jars, containers, etc   Baseline eval = 45 lbs (Rt = 60 lbs)    Time 8   Period Weeks   Status New     OT LONG TERM GOAL #3   Title Pt to retrieve/replace 5 lb object from overheadl shelf LUE 5/5 trials with no compensations   Time 8   Period Weeks   Status New               Plan - 11/26/16 1138    Clinical Impression Statement Pt continues to show progress with RUE strength, endurance, and functional use   Rehab Potential Good   OT Frequency 2x / week   OT Duration 8 weeks   OT Treatment/Interventions Self-care/ADL training;DME and/or AE instruction;Patient/family education;Therapeutic exercises;Therapeutic activities;Neuromuscular education;Functional Mobility Training;Passive range of motion;Visual/perceptual  remediation/compensation   Plan G-code and PN, continue coordination, in hand manipulation, strengthening UE   OT Home Exercise Plan Education provided:  Red putty/Coordination HEP, strengthening HEP LUE   Consulted and Agree with Plan of Care Patient      Patient will benefit from skilled therapeutic intervention in order to improve the following deficits and impairments:  Decreased coordination, Decreased range of motion, Impaired flexibility, Improper body mechanics, Impaired sensation, Decreased endurance, Decreased activity tolerance, Impaired UE functional use, Decreased mobility, Decreased strength, Impaired perceived functional ability  Visit Diagnosis: Muscle weakness (generalized)  Other lack of coordination    Problem List Patient Active Problem List   Diagnosis Date Noted  . Hemiparesis affecting left side as late effect of stroke ([place]) 08/07/2016  . Osteoarthritis of right knee 08/07/2016  . Dysphagia, post-stroke 08/07/2016  . Sleep disturbance 08/07/2016  . Rash and nonspecific skin eruption 08/07/2016  . Left hemiparesis ([place])   . GERD (gastroesophageal reflux disease) 07/23/2016  . Basal ganglia infarction ([place]) 07/21/2016  . Stroke (cerebrum) ([place]) 07/19/2016  . HLD (hyperlipidemia) 08/14/2014  . Obesity (BMI 30-39.9) 08/17/2013  . Hyperlipidemia LDL goal < 100 02/15/2013  . TIA (transient ischemic attack) 12/14/2012  . Essential hypertension 12/14/2012  . Other and unspecified hyperlipidemia 12/14/2012    Carey Bullocks, OTR/L 11/26/2016, 11:39 AM  [facility] [address] [place], [place], [address] Phone: [phone]   Fax:  [phone]  Name: Catherine [name] MRN: [id number] Date of Birth: Feb 01, 1946

## 2016-11-26 NOTE — Patient Instructions (Signed)
Voice strengthening: * HUH! with 3 second breath hold  10 times 3x/day  * LOUD "AAAHHHHHH!" 10 times 2x/day  Voice range: Siren exercise - say "ooo", starting at the lowest voice you can and raising pitch as high as you can go, then back down as low as you can go  10-15 times 3x/day

## 2016-11-26 NOTE — Therapy (Signed)
[facility] [address] [place], [place], [address] Phone: [phone]   Fax:  [phone]  Physical Therapy Treatment  Patient Details  Name: Catherine [name] MRN: [id number] Date of Birth: [birth year] Referring Provider: Dr. Jaynee Eagles  Encounter Date: 11/26/2016      PT End of Session - 11/26/16 1230    Visit Number 9   Number of Visits 17   Date for PT Re-Evaluation 12/12/16   Authorization Type Medicare primary; BCBS 2nd-GCODE every 10th visit   PT Start Time 0849   PT Stop Time 0929   PT Time Calculation (min) 40 min   Equipment Utilized During Treatment Gait belt   Activity Tolerance Patient tolerated treatment well   Behavior During Therapy [facility] for tasks assessed/performed      Past Medical History:  Diagnosis Date  . Arthritis   . Cataract   . GERD (gastroesophageal reflux disease)   . Stroke ([place])   . TIA (transient ischemic attack)     Past Surgical History:  Procedure Laterality Date  . EYE SURGERY      There were no vitals filed for this visit.      Subjective Assessment - 11/26/16 0851    Subjective Went to my family doctor yesterday, and they looked at bloodwork.  Ultimately, I don't want to change the dosage on my statins because I don't tolerate them well.  Feel like today's a good day with less R knee pain.   Pertinent History CVA 07/19/16, R TKR 2008, ?possible Leg length difference with LLE shorter? from hospitalization   Patient Stated Goals Pt's goal for therapy is to get back to normal from prior to the CVA.   Currently in Pain? Yes   Pain Score 3    Pain Location Knee   Pain Orientation Right   Pain Descriptors / Indicators Aching   Pain Type Chronic pain   Pain Onset More than a month ago   Pain Frequency Intermittent   Aggravating Factors  weather, walking   Pain Relieving Factors exercise, medication                         [facility] Adult PT Treatment/Exercise -  11/26/16 0001      Ambulation/Gait   Ambulation/Gait Yes   Ambulation/Gait Assistance 6: Modified independent (Device/Increase time)   Ambulation Distance (Feet) 230 Feet  x 2; 100 ft x 2   Assistive device None   Gait Pattern Step-through pattern;Decreased arm swing - left;Decreased step length - left;Decreased arm swing - right;Decreased dorsiflexion - left;Decreased stance time - left   Ambulation Surface Level;Indoor;Unlevel   Ramp 5: Supervision  blue mat surface on ramp   Ramp Details (indicate cue type and reason) Cues for posture and foot clearance   Gait Comments Gait activities today without cane, including carrying items, 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     Knee/Hip Exercises: Standing   Knee Flexion Strengthening;Right;Left;1 set;10 reps  using red theraband   Hip Abduction Stengthening;Right;Left;1 set;10 reps  using red theraband   Hip Extension Stengthening;Right;Left;1 set;10 reps  Using red theraband   Other Standing Knee Exercises Discussed limitations for strengthening using theraband with exercises as above-pt voices some discomfort in R knee and hip,a nd pt appears to be compensating with trunk musculature when using theraband (better able to isolate muscle groups when she has used ankle weights).  Discussed that adjustable ankle cuff weights as option for home, and pt plans to look into these.             Balance Exercises - 11/26/16 1222      Balance Exercises: Standing   Balance Beam marching in place x 10 reps, alternating forward kicks x 10 reps, alternating forward step taps x 10 reps.  Head turns x 5 reps, then head nods x 5 reps   Tandem Gait Forward;Retro;Upper extremity support;Foam/compliant surface;3 reps  On balance beam   Other Standing Exercises On compliant ramp surface-on incline and decline:  head turns x 5 reps, then head nods x 5 reps; marching in place x 10  reps, forward step taps x 10 resp (needs min assist for balance on incline with marching)             PT Short Term Goals - 11/18/16 1746      PT SHORT TERM GOAL #1   Title Pt will perform HEP independently for improved functional mobility, balance and strength.  TARGET 11/12/16   Time 4   Period Weeks   Status Achieved     PT SHORT TERM GOAL #2   Title Pt will improve TUG score to less than or equal to 15 seconds for decreased fall risk.   Baseline >15 sec with cane; <15 seconds with no device   Time 4   Period Weeks   Status Partially Met     PT SHORT TERM GOAL #3   Title Pt will perform at least 8 of 10 reps of sit<>stand transfers with no UE support for improved transfer efficiency and safety.   Baseline needs UE support   Time 4   Period Weeks   Status Partially Met     PT SHORT TERM GOAL #4   Title Pt will verbalize understanding of CVA warning signs and risk factors.   Time 4   Period Weeks   Status Achieved           PT Long Term Goals - 10/14/16 1632      PT LONG TERM GOAL #1   Title Pt will verbalize understanding of fall prevention in the home environment.  TARGET 12/12/16   Time 8   Period Weeks   Status New     PT LONG TERM GOAL #2   Title Pt will improve TUG score to less than or equal to 13.5 seconds for decreased fall risk.   Time 8   Period Weeks   Status New     PT LONG TERM GOAL #3   Title Pt will improve Dynamic Gait Index score to at least 19/24 for decreased fall risk.   Time 8   Period Weeks   Status New     PT LONG TERM GOAL #4   Title Pt will improve gait velocity to at least 2.3 ft/sec for improved gait efficiency and safety.   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   Time 8   Period Weeks   Status New               Plan - 11/26/16 1231    Clinical  Impression Statement Trialed red theraband for leg strengthening exercises today, as pt does not currently have ankle weights.  Pt uses trunk muscles for compensation and does not exhibit good posture with theraband today.  She agrees that ankle weights would be better, and she will try to get some for home.  Will continue to benefit from skilled PT to address strength, balance, and gait.   Rehab Potential Good   PT Frequency 2x / week   PT Duration 8 weeks  plus eval   PT Treatment/Interventions ADLs/Self Care Home Management;Gait training;DME Instruction;Neuromuscular re-education;Balance training;Therapeutic exercise;Functional mobility training;Therapeutic activities   PT Next Visit Plan Continue hip strengthening with ankle weights (formally add to HEP)-check to see if patient has gotten some for home; compliant surface balance and gait activities; may plan to d/c early, week of 12/07/16  GCODE on 10th visit-DUE NEXT VISIT   Consulted and Agree with Plan of Care Patient      Patient will benefit from skilled therapeutic intervention in order to improve the following deficits and impairments:  Abnormal gait, Decreased balance, Decreased mobility, Decreased knowledge of use of DME, Difficulty walking, Decreased strength, Pain  Visit Diagnosis: Muscle weakness (generalized)  Unsteadiness on feet  Other abnormalities of gait and mobility     Problem List Patient Active Problem List   Diagnosis Date Noted  . Hemiparesis affecting left side as late effect of stroke ([place]) 08/07/2016  . Osteoarthritis of right knee 08/07/2016  . Dysphagia, post-stroke 08/07/2016  . Sleep disturbance 08/07/2016  . Rash and nonspecific skin eruption 08/07/2016  . Left hemiparesis ([place])   . GERD (gastroesophageal reflux disease) 07/23/2016  . Basal ganglia infarction ([place]) 07/21/2016  . Stroke (cerebrum) ([place]) 07/19/2016  . HLD (hyperlipidemia) 08/14/2014  . Obesity (BMI 30-39.9) 08/17/2013  .  Hyperlipidemia LDL goal < 100 02/15/2013  . TIA (transient ischemic attack) 12/14/2012  . Essential hypertension 12/14/2012  . Other and unspecified hyperlipidemia 12/14/2012    Kempton Milne W. 11/26/2016, 12:35 PM  Frazier Butt., PT   [facility] [address] [place], [place], [address] Phone: [phone]   Fax:  [phone]  Name: SHALVA ROZYCKI MRN: [id number] Date of Birth: 01-15-1946

## 2016-11-30 ENCOUNTER — Ambulatory Visit: Payer: Medicare Other | Admitting: Occupational Therapy

## 2016-11-30 ENCOUNTER — Ambulatory Visit: Payer: Medicare Other | Admitting: Physical Therapy

## 2016-11-30 ENCOUNTER — Ambulatory Visit: Payer: Medicare Other | Admitting: Speech Pathology

## 2016-11-30 ENCOUNTER — Encounter: Payer: Self-pay | Admitting: Physical Therapy

## 2016-11-30 DIAGNOSIS — R278 Other lack of coordination: Secondary | ICD-10-CM

## 2016-11-30 DIAGNOSIS — R2689 Other abnormalities of gait and mobility: Secondary | ICD-10-CM

## 2016-11-30 DIAGNOSIS — R2681 Unsteadiness on feet: Secondary | ICD-10-CM

## 2016-11-30 DIAGNOSIS — R471 Dysarthria and anarthria: Secondary | ICD-10-CM | POA: Diagnosis not present

## 2016-11-30 DIAGNOSIS — M6281 Muscle weakness (generalized): Secondary | ICD-10-CM

## 2016-11-30 DIAGNOSIS — R208 Other disturbances of skin sensation: Secondary | ICD-10-CM | POA: Diagnosis not present

## 2016-11-30 NOTE — Therapy (Signed)
[facility] [address] [place], [place], [address] Phone: [phone]   Fax:  [phone]  Occupational Therapy Treatment  Patient Details  Name: Catherine [name] MRN: [id number] Date of Birth: 1945-12-12 Referring Provider: Dr. Sarina Ill  Encounter Date: 11/30/2016      OT End of Session - 11/30/16 1049    Visit Number 10   Number of Visits 17   Date for OT Re-Evaluation 12/12/16   Authorization Type MCR/BCBS - G code needed   Authorization - Visit Number 10   Authorization - Number of Visits 10   OT Start Time 1020   OT Stop Time 1100   OT Time Calculation (min) 40 min   Activity Tolerance Patient tolerated treatment well      Past Medical History:  Diagnosis Date  . Arthritis   . Cataract   . GERD (gastroesophageal reflux disease)   . Stroke ([place])   . TIA (transient ischemic attack)     Past Surgical History:  Procedure Laterality Date  . EYE SURGERY      There were no vitals filed for this visit.      Subjective Assessment - 11/30/16 1021    Pertinent History 07/19/16: CVA.  PMH: TIA 2014, OA, GERD, Rt TKR 2008   Limitations No lifting > 10 lbs   Patient Stated Goals improve fine motor skills   Currently in Pain? No/denies            [facility] OT Assessment - 11/30/16 0001      Coordination   Left 9 Hole Peg Test 31 sec.      Hand Function   Left Hand Grip (lbs) 55 lbs                  OT Treatments/Exercises (OP) - 11/30/16 0001      Shoulder Exercises: ROM/Strengthening   UBE (Upper Arm Bike) UBE x 10 min. Level 5     Hand Exercises   Other Hand Exercises Gripper at level 2 resistance to pick up blocks Lt hand for sustained grip strength. Pt with min difficulty and drops.      Functional Reaching Activities   High Level Pt placing small pegs in pegboard for coordination Lt hand while on vertical surface requiring 90 to 120* sh. flexion LUE. Pt only had 2 drops. Pt  copying design at 100% accuracy. Pt then removed all pegs LUE. Followed by lifting 3 lb. object on high shelf LUE x 5 trials                  OT Short Term Goals - 11/30/16 1042      OT SHORT TERM GOAL #1   Title Independent with coordination and putty HEP for Lt hand (STG's due 11/12/16)    Time 4   Period Weeks   Status Achieved     OT SHORT TERM GOAL #2   Title Pt to be independent with strengthening HEP for LUE   Time 4   Period Weeks   Status Achieved     OT SHORT TERM GOAL #3   Title Pt to attend to Lt side with simple multi-tasking activity while carrying plate and/or cup of water w/o drops/spills Lt hand   Time 4   Period Weeks   Status Achieved     OT SHORT TERM GOAL #4   Title Pt to perform high level LUE reaching to retrieve/replace 2 lb. object from high shelf with min  compensations    Time 4   Period Weeks   Status Achieved           OT Long Term Goals - 12/20/2016 1043      OT LONG TERM GOAL #1   Title Improve coordination Lt hand as evidenced by reducing speed on 9 hole peg test to 28 sec. or less (LTG's due 12/12/16)   Baseline 35.72 sec. (Rt = 21.25 sec)    Time 8   Period Weeks   Status On-going     OT LONG TERM GOAL #2   Title Improve grip strength Lt hand to 50 lbs or greater to assist with opening jars, containers, etc   Baseline eval = 45 lbs (Rt = 60 lbs)    Time 8   Period Weeks   Status Achieved  12-20-16: 55 lbs     OT LONG TERM GOAL #3   Title Pt to retrieve/replace 5 lb object from overheadl shelf LUE 5/5 trials with no compensations   Time 8   Period Weeks   Status On-going               Plan - December 20, 2016 1050    Clinical Impression Statement Pt has met all STG's and progressing towards LTG's.    Rehab Potential Good   OT Frequency 2x / week   OT Duration 8 weeks   OT Treatment/Interventions Self-care/ADL training;DME and/or AE instruction;Patient/family education;Therapeutic exercises;Therapeutic  activities;Neuromuscular education;Functional Mobility Training;Passive range of motion;Visual/perceptual remediation/compensation   Plan continue progress towards LTG's   OT Home Exercise Plan Education provided:  Red putty/Coordination HEP, strengthening HEP LUE      Patient will benefit from skilled therapeutic intervention in order to improve the following deficits and impairments:  Decreased coordination, Decreased range of motion, Impaired flexibility, Improper body mechanics, Impaired sensation, Decreased endurance, Decreased activity tolerance, Impaired UE functional use, Decreased mobility, Decreased strength, Impaired perceived functional ability  Visit Diagnosis: Muscle weakness (generalized)  Other lack of coordination      G-Codes - December 20, 2016 1051    Functional Assessment Tool Used (Outpatient only) LUE: decreased dropping Lt hand, 9 hole peg test = 31 sec. Grip strength = 55 lbs, increased strength and ROM   Functional Limitation Carrying, moving and handling objects   Carrying, Moving and Handling Objects Current Status [phone]) At least 1 percent but less than 20 percent impaired, limited or restricted   Carrying, Moving and Handling Objects Goal Status ([id number]) At least 1 percent but less than 20 percent impaired, limited or restricted      Problem List Patient Active Problem List   Diagnosis Date Noted  . Hemiparesis affecting left side as late effect of stroke ([place]) 08/07/2016  . Osteoarthritis of right knee 08/07/2016  . Dysphagia, post-stroke 08/07/2016  . Sleep disturbance 08/07/2016  . Rash and nonspecific skin eruption 08/07/2016  . Left hemiparesis ([place])   . GERD (gastroesophageal reflux disease) 07/23/2016  . Basal ganglia infarction ([place]) 07/21/2016  . Stroke (cerebrum) ([place]) 07/19/2016  . HLD (hyperlipidemia) 08/14/2014  . Obesity (BMI 30-39.9) 08/17/2013  . Hyperlipidemia LDL goal < 100 02/15/2013  . TIA (transient ischemic attack) 12/14/2012  .  Essential hypertension 12/14/2012  . 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

## 2016-11-30 NOTE — Patient Instructions (Addendum)
"  I love a Database administrator    Using a chair if necessary, march in place lifting knee as high as you can. Repeat 10 times per leg. Do 2 sessions per day. Add 2 pound leg weight if available.  http://gt2.exer.us/345   Copyright  VHI. All rights reserved.  Hip Side Kick    Holding a chair for balance, keep legs shoulder width apart and toes pointed forward. Lift leg out to side, keeping knee straight. Do not lean. Repeat using other leg. Repeat 10 times per leg. Do 2 sessions per day. Add 2 pound leg weight if available.  http://gt2.exer.us/343   Copyright  VHI. All rights reserved.  Hip Backward Kick    Using a chair for balance, keep legs shoulder width apart and toes pointed forward. Slowly extend one leg back, keeping knee straight. Do not lean forward. Repeat with other leg. Repeat 10 times per leg. Do 2 sessions per day. Add 2 pound leg weight if available.  http://gt2.exer.us/341   Copyright  VHI. All rights reserved.

## 2016-11-30 NOTE — Therapy (Signed)
[facility] [address] [place], [place], [address] Phone: [phone]   Fax:  [phone]  Speech Language Pathology Treatment  Patient Details  Name: Catherine [name] MRN: [id number] Date of Birth: 01-25-1946 Referring Provider: Sarina Ill, MD  Encounter Date: 11/30/2016      End of Session - 11/30/16 1742    Visit Number 4   Number of Visits 17   Date for SLP Re-Evaluation 01/22/17   SLP Start Time 0932   SLP Stop Time  1015   SLP Time Calculation (min) 43 min   Activity Tolerance Patient tolerated treatment well      Past Medical History:  Diagnosis Date  . Arthritis   . Cataract   . GERD (gastroesophageal reflux disease)   . Stroke ([place])   . TIA (transient ischemic attack)     Past Surgical History:  Procedure Laterality Date  . EYE SURGERY      There were no vitals filed for this visit.      Subjective Assessment - 11/30/16 0937    Subjective "When it closes, it doesn't close all the way." (re: ENT about her vocal cords)   Currently in Pain? No/denies               ADULT SLP TREATMENT - 11/30/16 0943      General Information   Behavior/Cognition Alert;Cooperative;Pleasant mood   Patient Positioning Upright in chair   Oral care provided N/A     Treatment Provided   Treatment provided Cognitive-Linquistic     Cognitive-Linquistic Treatment   Treatment focused on Dysarthria   Skilled Treatment SLP provided instruction in AB; pt initially required max A with tactile, demonstration and verbal cues for AB at rest. Noted neck accessory muscle tension with chest breathing. Pt described PT exercises she completes for neck tension; she may benefit from completing these prior to AB/ speech HEP. Pt benefitted from awareness training of AB while lying in supine position; SLP faded support cues as pt progressed to sitting and continued AB at rest while self-monitoring for chest movement with  visual feedback (mirror). SLP advanced speech hierarchy through phonation and word level tasks with AB, which pt performed with 85% accuracy and mod cues for increased vocal effort/loudness.     Assessment / Recommendations / Plan   Plan Continue with current plan of care     Progression Toward Goals   Progression toward goals Progressing toward goals          SLP Education - 11/30/16 1742    Education provided Yes   Education Details supine position for awareness of movement for AB   Person(s) Educated Patient   Methods Explanation;Demonstration;Tactile cues;Verbal cues   Comprehension Verbalized understanding;Returned demonstration;Need further instruction          SLP Short Term Goals - 11/30/16 1744      SLP SHORT TERM GOAL #1   Title pt will use abdominal breathing (AB) at rest 85% of the time over 5 minute period x3 sessions   Time 3   Period Weeks   Status On-going     SLP SHORT TERM GOAL #2   Title pt will demo AB in 14/20 sentence responses x2 sessions   Time 3   Period Weeks   Status On-going     SLP SHORT TERM GOAL #3   Title pt will produce sentences with low 70s dB average loudness 80% of the time over 2 visits   Baseline 11-26-16  Time 3   Period Weeks   Status On-going     SLP SHORT TERM GOAL #4   Title pt will undergo cognitive linguistic assessment    Status Deferred          SLP Long Term Goals - 11/30/16 1745      SLP LONG TERM GOAL #1   Title pt will demo AB 70% of the time in 10 minutes simple conversaiton x3 visits   Time 7   Period Weeks   Status On-going     SLP LONG TERM GOAL #2   Title pt will improve conversational loudness to low 70s dB average, in 10 minutes simple conversation  x3   Baseline pt rated voice 5-6/10 on eval   Time 7   Period Weeks   Status On-going     SLP LONG TERM GOAL #3   Title pt will demo adequate respiratory support 80% of the time in 7 minutes mod complex conversation   Time 7   Period Weeks    Status On-going          Plan - 11/30/16 1743    Clinical Impression Statement Pt presents today with chest breathing and accessory muscle tension. Benefitted from tactile, demonstration and biofeedback for abdominal breathing and reduced neck tension. Skilled ST is beneficial to provide her with opportunity to maintain improvement with her conversational volume. Likely decr to x1/week in next 1-3 visits.   Speech Therapy Frequency 2x / week   Treatment/Interventions Compensatory strategies;Patient/family education;Functional tasks;SLP instruction and feedback;Multimodal communcation approach;Cognitive reorganization;Environmental controls   Potential to Achieve Goals Good   SLP Home Exercise Plan continue HEP, AB   Consulted and Agree with Plan of Care Patient      Patient will benefit from skilled therapeutic intervention in order to improve the following deficits and impairments:   Dysarthria and anarthria    Problem List Patient Active Problem List   Diagnosis Date Noted  . Hemiparesis affecting left side as late effect of stroke ([place]) 08/07/2016  . Osteoarthritis of right knee 08/07/2016  . Dysphagia, post-stroke 08/07/2016  . Sleep disturbance 08/07/2016  . Rash and nonspecific skin eruption 08/07/2016  . Left hemiparesis ([facility])   . GERD (gastroesophageal reflux disease) 07/23/2016  . Basal ganglia infarction ([place]) 07/21/2016  . Stroke (cerebrum) ([place]) 07/19/2016  . HLD (hyperlipidemia) 08/14/2014  . Obesity (BMI 30-39.9) 08/17/2013  . Hyperlipidemia LDL goal < 100 02/15/2013  . TIA (transient ischemic attack) 12/14/2012  . Essential hypertension 12/14/2012  . Other and unspecified hyperlipidemia 12/14/2012   Deneise Lever, [place], [place] 11/30/2016, 5:45 PM  [facility] [address] [place], [place], [address] Phone: [phone]   Fax:  [phone]   Name:  Catherine [name] MRN: [id number] Date of Birth: Sep 06, 1945

## 2016-11-30 NOTE — Therapy (Addendum)
[facility] [address] [place], [place], [address] Phone: [phone]   Fax:  [phone]  Physical Therapy Treatment  Patient Details  Name: Catherine [name] MRN: [id number] Date of Birth: July 27, 1945 Referring Provider: Dr. Jaynee Eagles  Encounter Date: 11/30/2016      PT End of Session - 11/30/16 0853    Visit Number 10   Number of Visits 17   Date for PT Re-Evaluation 12/12/16   Authorization Type Medicare primary; BCBS 2nd-GCODE every 10th visit   PT Start Time 0849   PT Stop Time 0930   PT Time Calculation (min) 41 min   Equipment Utilized During Treatment --   Activity Tolerance Patient tolerated treatment well   Behavior During Therapy [facility] for tasks assessed/performed      Past Medical History:  Diagnosis Date  . Arthritis   . Cataract   . GERD (gastroesophageal reflux disease)   . Stroke ([place])   . TIA (transient ischemic attack)     Past Surgical History:  Procedure Laterality Date  . EYE SURGERY      There were no vitals filed for this visit.      Subjective Assessment - 11/30/16 0851    Subjective No new complaints. Her daughter is looking into adjustable ankle weights for her HEP. No falls or pain to report.    Pertinent History CVA 07/19/16, R TKR 2008, ?possible Leg length difference with LLE shorter? from hospitalization   Patient Stated Goals Pt's goal for therapy is to get back to normal from prior to the CVA.   Currently in Pain? No/denies   Pain Score 0-No pain            [facility] PT Assessment - 11/30/16 0951      Ambulation/Gait   Gait velocity 4.6 f/s  7.15s     Dynamic Gait Index   Level Surface Normal   Change in Gait Speed Normal   Gait with Horizontal Head Turns Mild Impairment   Gait with Vertical Head Turns Mild Impairment   Gait and Pivot Turn Normal   Step Over Obstacle Normal   Step Around Obstacles Normal   Steps Normal   Total Score 22   DGI comment: Score  indicates reduced risk of falls     Timed Up and Go Test   TUG Normal TUG   Normal TUG (seconds) 9.19   TUG Comments Score indicates reduced risk of falls              [facility] Adult PT Treatment/Exercise - 11/30/16 3570      Neuro Re-ed    Neuro Re-ed Details  Balance exercises in parallel bars on airex: narrow BOS/E0 60s, wide BOS/EC 60s, tandem stance left leg forward 30s, tandem stance right leg forward 30s, side steps off airex 20 per leg, toe taps off airex 20 per leg. PT provided visual, verbal, tactile and demo cues for proper form.     Knee/Hip Exercises: Standing   Knee Flexion Strengthening;Right;Left;1 set;20 reps  2 pound ankle weight   Knee Flexion Limitations PT provided demo, visual, verbal and tactile cues for form.   Hip Abduction Stengthening;Right;Left;1 set;20 reps;Knee straight;Other (comment)  2 pound ankle weight   Abduction Limitations PT provided demo, visual, verbal and tactile cues for form.   Hip Extension Stengthening;Right;Left;20 reps;Knee straight;Other (comment)  2 pound ankle weight   Extension Limitations PT provided demo, visual, verbal and tactile cues for form.  PT Education - 11/30/16 1010    Education provided Yes   Education Details HEP for standing hip exercises   Person(s) Educated Patient   Methods Explanation;Demonstration;Handout   Comprehension Verbalized understanding;Returned demonstration;Verbal cues required;Tactile cues required          PT Short Term Goals - 11/30/16 1022      PT SHORT TERM GOAL #1   Title Pt will perform HEP independently for improved functional mobility, balance and strength.  TARGET 11/12/16   Time 4   Period Weeks   Status Achieved     PT SHORT TERM GOAL #2   Title Pt will improve TUG score to less than or equal to 15 seconds for decreased fall risk.   Baseline >15 sec with cane; <15 seconds with no device; TUG 9.19s on 11/30/16   Time 4   Period Weeks   Status Achieved      PT SHORT TERM GOAL #3   Title Pt will perform at least 8 of 10 reps of sit<>stand transfers with no UE support for improved transfer efficiency and safety.   Baseline needs UE support   Time 4   Period Weeks   Status Partially Met     PT SHORT TERM GOAL #4   Title Pt will verbalize understanding of CVA warning signs and risk factors.   Time 4   Period Weeks   Status Achieved           PT Long Term Goals - 11/30/16 1024      PT LONG TERM GOAL #1   Title Pt will verbalize understanding of fall prevention in the home environment.  TARGET 12/12/16   Time 8   Period Weeks   Status On-going   Target Date --     PT LONG TERM GOAL #2   Title Pt will improve TUG score to less than or equal to 13.5 seconds for decreased fall risk.   Baseline 9.19s on 11/30/16   Time --   Period --   Status Achieved   Target Date --     PT LONG TERM GOAL #3   Title Pt will improve Dynamic Gait Index score to at least 19/24 for decreased fall risk.   Baseline 22/24 on 11/30/16   Time --   Period --   Status Achieved   Target Date --     PT LONG TERM GOAL #4   Title Pt will improve gait velocity to at least 2.3 ft/sec for improved gait efficiency and safety.   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   Time 8   Period Weeks   Status On-going   Target Date --            Plan - 11/30/16 3382    Clinical Impression Statement Patient exhibited improvements on TUG, DGI and gait speed today. Performed standing balance exercises on airex with good stability and minimal sway. Performed hip extension/abduction/flexion exercises with 2 pound ankle weights to demo for HEP; patient tends to use trunk muscles for compensation and requires visual, verbal, demo and tactiile cues to maintain proper  form. Will continue to benefit from skilled therapy to progress toward goals.   Clinical Presentation due to:  CVA L sided weakness, independent prior to CVA, intermittent R knee pain   Rehab Potential Good   PT Frequency 2x / week   PT Duration 8 weeks  plus eval   PT Treatment/Interventions ADLs/Self Care Home Management;Gait training;DME Instruction;Neuromuscular re-education;Balance training;Therapeutic exercise;Functional mobility training;Therapeutic activities   PT Next Visit Plan Continue hip strengthening with ankle weights-check to see if patient has gotten some for home; compliant surface balance and gait activities; assess ambulation on a variety of surfaces for 1000 feet without AD. May plan to d/c early, week of 12/07/16   PT Home Exercise Plan Standing hip extension/abduction/flexion using ankle weights if available   Consulted and Agree with Plan of Care Patient      Patient will benefit from skilled therapeutic intervention in order to improve the following deficits and impairments:  Abnormal gait, Decreased mobility, Decreased knowledge of use of DME, Difficulty walking, Decreased strength, Pain  Visit Diagnosis: Muscle weakness (generalized)  Unsteadiness on feet  Other abnormalities of gait and mobility       G-Codes - 20-Dec-2016 1250    Functional Assessment Tool Used (Outpatient Only) gt vel 4.6 ft/sec, DGI 22/24, TUG 9.19   Functional Limitation Mobility: Walking and moving around      Problem List Patient Active Problem List   Diagnosis Date Noted  . Hemiparesis affecting left side as late effect of stroke ([place]) 08/07/2016  . Osteoarthritis of right knee 08/07/2016  . Dysphagia, post-stroke 08/07/2016  . Sleep disturbance 08/07/2016  . Rash and nonspecific skin eruption 08/07/2016  . Left hemiparesis ([place])   . GERD (gastroesophageal reflux disease) 07/23/2016  . Basal ganglia infarction ([place]) 07/21/2016  . Stroke (cerebrum) ([place]) 07/19/2016  . HLD  (hyperlipidemia) 08/14/2014  . Obesity (BMI 30-39.9) 08/17/2013  . Hyperlipidemia LDL goal < 100 02/15/2013  . TIA (transient ischemic attack) 12/14/2012  . Essential hypertension 12/14/2012  . Other and unspecified hyperlipidemia 12/14/2012    Gershon Crane, SPT 12-20-16, 12:51 PM  [facility] [address] [place], [place], [address] Phone: [phone]   Fax:  [phone]  Name: Catherine [name] MRN: [id number] Date of Birth: 06/06/1945  This note has been reviewed and edited by supervising CI.  Willow Ora, PTA, [facility] [address] [place], [place] [address] [phone] Dec 20, 2016, 4:17 PM       G-Codes - 2016-12-20 1250    Functional Assessment Tool Used (Outpatient Only) gt vel 4.6 ft/sec, DGI 22/24, TUG 9.19   Functional Limitation Mobility: Walking and moving around   Mobility: Walking and Moving Around Current Status [phone]) At least 1 percent but less than 20 percent impaired, limited or restricted   Mobility: Walking and Moving Around Goal Status [phone]) At least 1 percent but less than 20 percent impaired, limited or restricted      Physical Therapy Progress Note  Dates of Reporting Period: 10/14/2016 to 12-20-2016  Objective Reports of Subjective Statement: Patient reports improved balance and gait.   Objective Measurements: see above  Goal Update: see above  Plan: Continue established plan of care.   Reason Skilled Services are Required: Patient has balance and gait deficits that would benefit from skilled PT care / instructions to minimize fall risk.   Jamey Reas, PT, DPT PT Specializing in [place] 12-20-2016 4:46 PM Phone:  [phone]  Fax:  [phone] [facility] [address] [place], [place] [address]

## 2016-12-02 ENCOUNTER — Ambulatory Visit: Payer: Medicare Other | Admitting: Occupational Therapy

## 2016-12-02 ENCOUNTER — Encounter: Payer: Self-pay | Admitting: Physical Therapy

## 2016-12-02 ENCOUNTER — Encounter: Payer: Self-pay | Admitting: Occupational Therapy

## 2016-12-02 ENCOUNTER — Ambulatory Visit: Payer: Medicare Other

## 2016-12-02 ENCOUNTER — Ambulatory Visit: Payer: Medicare Other | Admitting: Physical Therapy

## 2016-12-02 DIAGNOSIS — R41842 Visuospatial deficit: Secondary | ICD-10-CM

## 2016-12-02 DIAGNOSIS — R278 Other lack of coordination: Secondary | ICD-10-CM

## 2016-12-02 DIAGNOSIS — R471 Dysarthria and anarthria: Secondary | ICD-10-CM | POA: Diagnosis not present

## 2016-12-02 DIAGNOSIS — M6281 Muscle weakness (generalized): Secondary | ICD-10-CM

## 2016-12-02 DIAGNOSIS — R2689 Other abnormalities of gait and mobility: Secondary | ICD-10-CM

## 2016-12-02 DIAGNOSIS — R208 Other disturbances of skin sensation: Secondary | ICD-10-CM

## 2016-12-02 DIAGNOSIS — R2681 Unsteadiness on feet: Secondary | ICD-10-CM | POA: Diagnosis not present

## 2016-12-02 NOTE — Therapy (Signed)
[facility] [address] [place], [place], [address] Phone: [phone]   Fax:  [phone]  Occupational Therapy Treatment  Patient Details  Name: Catherine [name] MRN: [id number] Date of Birth: 10-26-1945 Referring Provider: Dr. Sarina Ill  Encounter Date: 12/02/2016      OT End of Session - 12/02/16 1023    Visit Number 11   Number of Visits 17   Date for OT Re-Evaluation 12/12/16   Authorization Type MCR/BCBS - G code needed   Authorization - Visit Number 11   Authorization - Number of Visits 20   OT Start Time 7824   OT Stop Time 1100   OT Time Calculation (min) 42 min   Activity Tolerance Patient tolerated treatment well   Behavior During Therapy [facility] for tasks assessed/performed      Past Medical History:  Diagnosis Date  . Arthritis   . Cataract   . GERD (gastroesophageal reflux disease)   . Stroke ([place])   . TIA (transient ischemic attack)     Past Surgical History:  Procedure Laterality Date  . EYE SURGERY      There were no vitals filed for this visit.      Subjective Assessment - 12/02/16 0936    Subjective  I think this (numbness) is getting a little better each day.  I am using my hand for more every day.    Pertinent History 07/19/16: CVA.  PMH: TIA 2014, OA, GERD, Rt TKR 2008   Limitations No lifting > 10 lbs   Patient Stated Goals improve fine motor skills   Currently in Pain? No/denies   Pain Score 0-No pain            [facility] OT Assessment - 12/02/16 0001      Coordination   Left 9 Hole Peg Test 26.78                  OT Treatments/Exercises (OP) - 12/02/16 0001      Fine Motor Coordination   Manipulating coins --   Grooved pegs with left hand, holding 3-5 pegs at a time, to address in hand manipulation.     Other Fine Motor Exercises Stress balls - did well with rote repetition to improve performance.     Other Fine Motor Exercises Forearm gym to  de-emphasize shoulder motion from lower arm - forearm, elbow, wrist and hand.               Balance Exercises - 12/02/16 0820      Balance Exercises: Standing   Standing Eyes Closed Narrow base of support (BOS);Head turns;Foam/compliant surface;Other reps (comment);30 secs;Limitations   SLS with Vectors Foam/compliant surface;Other reps (comment);Limitations   Wall Bumps Hip   Wall Bumps-Hips Eyes opened;Anterior/posterior;10 reps;Limitations   Balance Beam standing across blue foam beam:     Balance Exercises: Standing   Standing Eyes Closed Limitations standing across blue foam beam:    SLS with Vectors Limitations 6 cones along edge of red mats: alternating fwd toe taps to each with side stepping left<>right x 1 lap each way, alternating double toe taps toe each cone with side stepping left<>right x 1 lap each way. min guard to min assist for balance with cues on posture    Wall Bumps Limitations standing across blue foam beam:           OT Education - 12/02/16 1022    Education provided Yes   Education Details reviewed long term goal #  1 - met (coordination)   Person(s) Educated Patient   Methods Explanation;Demonstration   Comprehension Verbalized understanding          OT Short Term Goals - 11/30/16 1042      OT SHORT TERM GOAL #1   Title Independent with coordination and putty HEP for Lt hand (STG's due 11/12/16)    Time 4   Period Weeks   Status Achieved     OT SHORT TERM GOAL #2   Title Pt to be independent with strengthening HEP for LUE   Time 4   Period Weeks   Status Achieved     OT SHORT TERM GOAL #3   Title Pt to attend to Lt side with simple multi-tasking activity while carrying plate and/or cup of water w/o drops/spills Lt hand   Time 4   Period Weeks   Status Achieved     OT SHORT TERM GOAL #4   Title Pt to perform high level LUE reaching to retrieve/replace 2 lb. object from high shelf with min compensations    Time 4   Period Weeks    Status Achieved           OT Long Term Goals - 12/02/16 1002      OT LONG TERM GOAL #1   Title Improve coordination Lt hand as evidenced by reducing speed on 9 hole peg test to 28 sec. or less (LTG's due 12/12/16)   Baseline 35.72 sec. (Rt = 21.25 sec)    Time 8   Period Weeks   Status Achieved  26.78 seconds     OT LONG TERM GOAL #2   Title Improve grip strength Lt hand to 50 lbs or greater to assist with opening jars, containers, etc   Baseline eval = 45 lbs (Rt = 60 lbs)    Time 8   Period Weeks   Status Achieved               Plan - 12/02/16 1101    Clinical Impression Statement Patient continues to show excellent progress toward gfoals, anticipate discharge next week.     Rehab Potential Good   OT Frequency 2x / week   OT Duration 8 weeks   OT Treatment/Interventions Self-care/ADL training;DME and/or AE instruction;Patient/family education;Therapeutic exercises;Therapeutic activities;Neuromuscular education;Functional Mobility Training;Passive range of motion;Visual/perceptual remediation/compensation   Plan proximal strengthen, check one remaining LTG.    OT Home Exercise Plan Education provided:  Red putty/Coordination HEP, strengthening HEP LUE   Consulted and Agree with Plan of Care Patient      Patient will benefit from skilled therapeutic intervention in order to improve the following deficits and impairments:  Decreased coordination, Decreased range of motion, Impaired flexibility, Improper body mechanics, Impaired sensation, Decreased endurance, Decreased activity tolerance, Impaired UE functional use, Decreased mobility, Decreased strength, Impaired perceived functional ability  Visit Diagnosis: Muscle weakness (generalized)  Other lack of coordination  Other disturbances of skin sensation  Visuospatial deficit    Problem List Patient Active Problem List   Diagnosis Date Noted  . Hemiparesis affecting left side as late effect of stroke ([place])  08/07/2016  . Osteoarthritis of right knee 08/07/2016  . Dysphagia, post-stroke 08/07/2016  . Sleep disturbance 08/07/2016  . Rash and nonspecific skin eruption 08/07/2016  . Left hemiparesis ([place])   . GERD (gastroesophageal reflux disease) 07/23/2016  . Basal ganglia infarction ([place]) 07/21/2016  . Stroke (cerebrum) ([place]) 07/19/2016  . HLD (hyperlipidemia) 08/14/2014  . Obesity (BMI 30-39.9) 08/17/2013  . Hyperlipidemia LDL  goal < 100 02/15/2013  . TIA (transient ischemic attack) 12/14/2012  . Essential hypertension 12/14/2012  . Other and unspecified hyperlipidemia 12/14/2012    Mariah Milling, OTR/L 12/02/2016, 11:03 AM  [facility] [address] [place], [place], [address] Phone: [phone]   Fax:  [phone]  Name: Catherine [name] MRN: [id number] Date of Birth: 11-12-1945

## 2016-12-02 NOTE — Patient Instructions (Addendum)
Nursery Rhymes to practice breathing:  Humpty Dumpty  Little Bo Peep  Little Surveyor, minerals Dickory Dock  Baa Baa Black Sheep

## 2016-12-03 DIAGNOSIS — Z85828 Personal history of other malignant neoplasm of skin: Secondary | ICD-10-CM | POA: Diagnosis not present

## 2016-12-03 DIAGNOSIS — L820 Inflamed seborrheic keratosis: Secondary | ICD-10-CM | POA: Diagnosis not present

## 2016-12-03 DIAGNOSIS — D1801 Hemangioma of skin and subcutaneous tissue: Secondary | ICD-10-CM | POA: Diagnosis not present

## 2016-12-03 DIAGNOSIS — D225 Melanocytic nevi of trunk: Secondary | ICD-10-CM | POA: Diagnosis not present

## 2016-12-03 DIAGNOSIS — L821 Other seborrheic keratosis: Secondary | ICD-10-CM | POA: Diagnosis not present

## 2016-12-03 NOTE — Therapy (Signed)
[facility] [address] [place], [place], [address] Phone: [phone]   Fax:  [phone]  Physical Therapy Treatment  Patient Details  Name: Catherine [name] MRN: [id number] Date of Birth: 11/30/1945 Referring Provider: Dr. Jaynee Eagles  Encounter Date: 12/02/2016      PT End of Session - 12/02/16 0808    Visit Number 11   Number of Visits 17   Date for PT Re-Evaluation 12/12/16   Authorization Type Medicare primary; BCBS 2nd-GCODE every 10th visit   PT Start Time 0805   PT Stop Time 0845   PT Time Calculation (min) 40 min   Equipment Utilized During Treatment Gait belt   Activity Tolerance Patient tolerated treatment well   Behavior During Therapy [facility] for tasks assessed/performed      Past Medical History:  Diagnosis Date  . Arthritis   . Cataract   . GERD (gastroesophageal reflux disease)   . Stroke ([place])   . TIA (transient ischemic attack)     Past Surgical History:  Procedure Laterality Date  . EYE SURGERY      There were no vitals filed for this visit.      Subjective Assessment - 12/02/16 0807    Subjective No new complaints. No fall or pain to report. Still looking for ankle weights with her daughters help.   Pertinent History CVA 07/19/16, R TKR 2008, ?possible Leg length difference with LLE shorter? from hospitalization   Patient Stated Goals Pt's goal for therapy is to get back to normal from prior to the CVA.   Currently in Pain? No/denies   Pain Score 0-No pain             [facility] Adult PT Treatment/Exercise - 12/02/16 0810      High Level Balance   High Level Balance Activities Tandem walking;Marching forwards;Marching backwards  tandem, toe, heel walking fwd/bwd   High Level Balance Comments red mats next to counter top:3 laps each way with min guard to min assist for balance with hand hovering over counter top, touching as needed for balance. Cues on posture and weight shifting for  balance assistance.             Balance Exercises - 12/02/16 0820      Balance Exercises: Standing   Standing Eyes Closed Narrow base of support (BOS);Head turns;Foam/compliant surface;Other reps (comment);30 secs;Limitations   SLS with Vectors Foam/compliant surface;Other reps (comment);Limitations   Wall Bumps Hip   Wall Bumps-Hips Eyes opened;Anterior/posterior;10 reps;Limitations   Balance Beam standing across blue foam beam: alternating fwd heel taps to floor and back onto beam, then alternating bwd toe taps to floor and back onto beam. 10 reps each leg with min guard to min assist for balance, no UE support. Cues on posture and weight shifting.      Balance Exercises: Standing   Standing Eyes Closed Limitations standing across blue foam beam: EC no head movements for 30 sec's x 3 reps, progressing to EC with head movements left<>right, up<>down and diagonals both ways x 10 reps each. Min guard to min assist for balance with cues on posture and weight shifting for balance.   SLS with Vectors Limitations 6 cones along edge of red mats: alternating fwd toe taps to each with side stepping left<>right x 1 lap each way, alternating double toe taps toe each cone with side stepping left<>right x 1 lap each way. min guard to min assist for balance with cues on posture    Wall  Bumps Limitations standing across blue foam beam: x 10 reps with cues on ex form and technique.              PT Short Term Goals - 11/30/16 1022      PT SHORT TERM GOAL #1   Title Pt will perform HEP independently for improved functional mobility, balance and strength.  TARGET 11/12/16   Time --   Period --   Status Achieved     PT SHORT TERM GOAL #2   Title Pt will improve TUG score to less than or equal to 15 seconds for decreased fall risk.   Baseline >15 sec with cane; <15 seconds with no device; TUG 9.19s on 11/30/16   Time --   Period --   Status Achieved     PT SHORT TERM GOAL #3   Title Pt will  perform at least 8 of 10 reps of sit<>stand transfers with no UE support for improved transfer efficiency and safety.   Baseline needs UE support   Time --   Period --   Status Partially Met     PT SHORT TERM GOAL #4   Title Pt will verbalize understanding of CVA warning signs and risk factors.   Time --   Period --   Status Achieved           PT Long Term Goals - 11/30/16 1024      PT LONG TERM GOAL #1   Title Pt will verbalize understanding of fall prevention in the home environment.  TARGET 12/12/16   Time 8   Period Weeks   Status On-going   Target Date --     PT LONG TERM GOAL #2   Title Pt will improve TUG score to less than or equal to 13.5 seconds for decreased fall risk.   Baseline 9.19s on 11/30/16   Time --   Period --   Status Achieved   Target Date --     PT LONG TERM GOAL #3   Title Pt will improve Dynamic Gait Index score to at least 19/24 for decreased fall risk.   Baseline 22/24 on 11/30/16   Time --   Period --   Status Achieved   Target Date --     PT LONG TERM GOAL #4   Title Pt will improve gait velocity to at least 2.3 ft/sec for improved gait efficiency and safety.   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   Time 8   Period Weeks   Status On-going   Target Date --         12/02/16 0808  Plan  Clinical Impression Statement Today's skilled session focused on high level balance on complaint surfaces without any issues reported. Pt is progressing well with most LTGs met to date. Anticipate pt will be ready for discharge earlier than plan of care, next week, as primary PT stated in her last note.                       Pt will benefit from skilled therapeutic intervention in order to improve on the following deficits Abnormal  gait;Decreased mobility;Decreased knowledge of use of DME;Difficulty walking;Decreased strength;Pain  Rehab Potential Good  PT Frequency 2x / week  PT Duration 8 weeks (plus eval)  PT Treatment/Interventions ADLs/Self Care Home Management;Gait training;DME Instruction;Neuromuscular re-education;Balance training;Therapeutic exercise;Functional mobility training;Therapeutic activities  PT Next Visit Plan assess remaining LTGs for anticiptated early discharge (week of 12/07/16)  PT Home Exercise Plan Standing hip extension/abduction/flexion using ankle weights if available  Consulted and Agree with Plan of Care Patient        Patient will benefit from skilled therapeutic intervention in order to improve the following deficits and impairments:  Abnormal gait, Decreased mobility, Decreased knowledge of use of DME, Difficulty walking, Decreased strength, Pain  Visit Diagnosis: Muscle weakness (generalized)  Unsteadiness on feet  Other abnormalities of gait and mobility     Problem List Patient Active Problem List   Diagnosis Date Noted  . Hemiparesis affecting left side as late effect of stroke ([place]) 08/07/2016  . Osteoarthritis of right knee 08/07/2016  . Dysphagia, post-stroke 08/07/2016  . Sleep disturbance 08/07/2016  . Rash and nonspecific skin eruption 08/07/2016  . Left hemiparesis ([place])   . GERD (gastroesophageal reflux disease) 07/23/2016  . Basal ganglia infarction ([place]) 07/21/2016  . Stroke (cerebrum) ([place]) 07/19/2016  . HLD (hyperlipidemia) 08/14/2014  . Obesity (BMI 30-39.9) 08/17/2013  . Hyperlipidemia LDL goal < 100 02/15/2013  . TIA (transient ischemic attack) 12/14/2012  . Essential hypertension 12/14/2012  . Other and unspecified hyperlipidemia 12/14/2012    Willow Ora, PTA, [facility] [address] [place], [place] [address] [phone] 12/04/16, 9:02 AM   Name: Catherine [name] MRN: [id number] Date of Birth:  [birth year]

## 2016-12-03 NOTE — Therapy (Signed)
[facility] [address] [place], [place], [address] Phone: [phone]   Fax:  [phone]  Speech Language Pathology Treatment  Patient Details  Name: Catherine [name] MRN: [id number] Date of Birth: Nov 04, 1945 Referring Provider: Sarina Ill, MD  Encounter Date: 12/02/2016      End of Session - 12/03/16 0825    Visit Number 5   Number of Visits 17   Date for SLP Re-Evaluation 01/22/17   SLP Start Time 0850   SLP Stop Time  0930   SLP Time Calculation (min) 40 min   Activity Tolerance Patient tolerated treatment well      Past Medical History:  Diagnosis Date  . Arthritis   . Cataract   . GERD (gastroesophageal reflux disease)   . Stroke ([place])   . TIA (transient ischemic attack)     Past Surgical History:  Procedure Laterality Date  . EYE SURGERY      There were no vitals filed for this visit.             ADULT SLP TREATMENT - 12/03/16 0001      General Information   Behavior/Cognition Alert;Cooperative;Pleasant mood     Treatment Provided   Treatment provided Cognitive-Linquistic     Cognitive-Linquistic Treatment   Treatment focused on Dysarthria   Skilled Treatment Pt complaining about ageusia and SLP suspects pt neurologic deficit with senosory nerve for taste and suggested this to pt.      Assessment / Recommendations / Plan   Plan Continue with current plan of care     Progression Toward Goals   Progression toward goals Progressing toward goals     (Skilled tx, cont'd): Abdominal breathing prior to practice at rest in mod complex conversation <50%. AB at rest 85%, with rare min A. With /a/ (to eventually bridge to rote speech), AB noted 75% of the time. SLP incr'd complexity to rote speech (nursery rhymes) and AB was noted 75% of the time. SLP further incr'd complexity in simple conversation, 50% success. Homework for rote speech level.       SLP Short Term Goals - 12/03/16  6962      SLP SHORT TERM GOAL #1   Title pt will use abdominal breathing (AB) at rest 85% of the time over 5 minute period x3 sessions   Time 3   Period Weeks   Status On-going     SLP SHORT TERM GOAL #2   Title pt will demo AB in 14/20 sentence responses x2 sessions   Time 3   Period Weeks   Status On-going     SLP SHORT TERM GOAL #3   Title pt will produce sentences with low 70s dB average loudness 80% of the time over 2 visits   Baseline 11-26-16   Time 3   Period Weeks   Status On-going     SLP SHORT TERM GOAL #4   Title pt will undergo cognitive linguistic assessment    Status Deferred          SLP Long Term Goals - 12/03/16 9528      SLP LONG TERM GOAL #1   Title pt will demo AB 70% of the time in 10 minutes simple conversaiton x3 visits   Time 7   Period Weeks   Status On-going     SLP LONG TERM GOAL #2   Title pt will improve conversational loudness to low 70s dB average, in 10 minutes simple conversation  x3  Baseline pt rated voice 5-6/10 on eval   Time 7   Period Weeks   Status On-going     SLP LONG TERM GOAL #3   Title pt will demo adequate respiratory support 80% of the time in 7 minutes mod complex conversation   Time 7   Period Weeks   Status On-going          Plan - 12/03/16 0825    Clinical Impression Statement Pt presents today with less chest breathing than last week. She benefitted from demonstration and verbal cues for abdominal breathing. Skilled ST is beneficial to provide her with opportunity to maintain improvement with her conversational volume. Likely decr to x1/week in next 1-3 visits.   Speech Therapy Frequency 2x / week   Treatment/Interventions Compensatory strategies;Patient/family education;Functional tasks;SLP instruction and feedback;Multimodal communcation approach;Cognitive reorganization;Environmental controls   Potential to Achieve Goals Good   SLP Home Exercise Plan continue HEP, AB   Consulted and Agree with Plan of  Care Patient      Patient will benefit from skilled therapeutic intervention in order to improve the following deficits and impairments:   Dysarthria and anarthria    Problem List Patient Active Problem List   Diagnosis Date Noted  . Hemiparesis affecting left side as late effect of stroke ([place]) 08/07/2016  . Osteoarthritis of right knee 08/07/2016  . Dysphagia, post-stroke 08/07/2016  . Sleep disturbance 08/07/2016  . Rash and nonspecific skin eruption 08/07/2016  . Left hemiparesis ([place])   . GERD (gastroesophageal reflux disease) 07/23/2016  . Basal ganglia infarction ([place]) 07/21/2016  . Stroke (cerebrum) ([place]) 07/19/2016  . HLD (hyperlipidemia) 08/14/2014  . Obesity (BMI 30-39.9) 08/17/2013  . Hyperlipidemia LDL goal < 100 02/15/2013  . TIA (transient ischemic attack) 12/14/2012  . Essential hypertension 12/14/2012  . Other and unspecified hyperlipidemia 12/14/2012    [facility] ,[place], [place]  12/03/2016, 8:27 AM  [facility] [address] [place], [place], [address] Phone: [phone]   Fax:  [phone]   Name: Catherine [name] MRN: [id number] Date of Birth: Jan 04, 1946

## 2016-12-07 ENCOUNTER — Ambulatory Visit: Payer: Medicare Other | Admitting: Occupational Therapy

## 2016-12-07 ENCOUNTER — Ambulatory Visit: Payer: Medicare Other | Admitting: Physical Therapy

## 2016-12-07 ENCOUNTER — Ambulatory Visit: Payer: Medicare Other

## 2016-12-07 DIAGNOSIS — M6281 Muscle weakness (generalized): Secondary | ICD-10-CM

## 2016-12-07 DIAGNOSIS — R2689 Other abnormalities of gait and mobility: Secondary | ICD-10-CM

## 2016-12-07 DIAGNOSIS — R471 Dysarthria and anarthria: Secondary | ICD-10-CM | POA: Diagnosis not present

## 2016-12-07 DIAGNOSIS — R2681 Unsteadiness on feet: Secondary | ICD-10-CM | POA: Diagnosis not present

## 2016-12-07 DIAGNOSIS — R278 Other lack of coordination: Secondary | ICD-10-CM | POA: Diagnosis not present

## 2016-12-07 DIAGNOSIS — R208 Other disturbances of skin sensation: Secondary | ICD-10-CM | POA: Diagnosis not present

## 2016-12-07 NOTE — Therapy (Signed)
[facility] [address] [place], [place], [address] Phone: [phone]   Fax:  [phone]  Physical Therapy Treatment  Patient Details  Name: Catherine [name] MRN: [id number] Date of Birth: 08-25-1945 Referring Provider: Dr. Jaynee Eagles  Encounter Date: 12/07/2016      PT End of Session - 12/07/16 1145    Visit Number 12   Number of Visits 17   Date for PT Re-Evaluation 12/12/16   Authorization Type Medicare primary; BCBS 2nd-GCODE every 10th visit   PT Start Time 0851   PT Stop Time 0921   PT Time Calculation (min) 30 min   Activity Tolerance Patient tolerated treatment well   Behavior During Therapy [facility] for tasks assessed/performed      Past Medical History:  Diagnosis Date  . Arthritis   . Cataract   . GERD (gastroesophageal reflux disease)   . Stroke ([place])   . TIA (transient ischemic attack)     Past Surgical History:  Procedure Laterality Date  . EYE SURGERY      There were no vitals filed for this visit.      Subjective Assessment - 12/07/16 0853    Subjective Had a lot of pain due to arthritis yesterday, and I even had to use my cane.  Better today.   Pertinent History CVA 07/19/16, R TKR 2008, ?possible Leg length difference with LLE shorter? from hospitalization   Patient Stated Goals Pt's goal for therapy is to get back to normal from prior to the CVA.   Currently in Pain? Yes   Pain Score 2    Pain Location Back   Pain Orientation Right;Lower   Pain Descriptors / Indicators Aching   Pain Type Chronic pain   Pain Onset Yesterday   Pain Frequency Intermittent   Aggravating Factors  Bending over sometimes   Pain Relieving Factors rest                         [facility] Adult PT Treatment/Exercise - 12/07/16 1140      Ambulation/Gait   Ambulation/Gait Yes   Ambulation/Gait Assistance 6: Modified independent (Device/Increase time)   Ambulation/Gait Assistance Details Gait  training, no cane, indoor and outdoor surfaces   Ambulation Distance (Feet) 1000 Feet   Assistive device None   Gait Pattern Step-through pattern;Decreased arm swing - left;Decreased step length - left;Decreased arm swing - right;Decreased dorsiflexion - left;Decreased stance time - left   Ambulation Surface Level;Indoor;Unlevel;Outdoor   Gait Comments Gait activities outdoor surfaces, including sidewalk, gravel, and mulch, with conversation tasks, envrionmental scanning tasks, no LOB.  Gait with figure-8 around obstacles and stepping over obstacles, independently no LOB.  Occasional cues for neutral L foot positioning, to decrease L LE external rotation.     Exercises   Other Exercises  Seated LLE ankle inversion, 15 reps; ankle inversion on LLE, 2 reps gathering pillow case.     Knee/Hip Exercises: Seated   Ball Squeeze 10 reps, 3 second hold   Sit to Sand 5 reps;with UE support  cues for neutral LLE positioning           Self care:     PT Education - 12/07/16 1144    Education provided Yes   Education Details Discussed progress towards goals, plans for d/c this visit; reviewed fall prevention (previously provided handout); discussed importance of consistent performance of HEP even with d/c from PT.   Person(s) Educated Patient   Methods Explanation  Comprehension Verbalized understanding          PT Short Term Goals - 11/30/16 1022      PT SHORT TERM GOAL #1   Title Pt will perform HEP independently for improved functional mobility, balance and strength.  TARGET 11/12/16   Time --   Period --   Status Achieved     PT SHORT TERM GOAL #2   Title Pt will improve TUG score to less than or equal to 15 seconds for decreased fall risk.   Baseline >15 sec with cane; <15 seconds with no device; TUG 9.19s on 11/30/16   Time --   Period --   Status Achieved     PT SHORT TERM GOAL #3   Title Pt will perform at least 8 of 10 reps of sit<>stand transfers with no UE support for  improved transfer efficiency and safety.   Baseline needs UE support   Time --   Period --   Status Partially Met     PT SHORT TERM GOAL #4   Title Pt will verbalize understanding of CVA warning signs and risk factors.   Time --   Period --   Status Achieved           PT Long Term Goals - 12/07/16 0857      PT LONG TERM GOAL #1   Title Pt will verbalize understanding of fall prevention in the home environment.  TARGET 12/12/16   Time 8   Period Weeks   Status Achieved     PT LONG TERM GOAL #2   Title Pt will improve TUG score to less than or equal to 13.5 seconds for decreased fall risk.   Baseline 9.19s on 11/30/16   Status Achieved     PT LONG TERM GOAL #3   Title Pt will improve Dynamic Gait Index score to at least 19/24 for decreased fall risk.   Baseline 22/24 on 11/30/16   Status Achieved     PT LONG TERM GOAL #4   Title Pt will improve gait velocity to at least 2.3 ft/sec for improved gait efficiency and safety.   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   Time 8   Period Weeks   Status Achieved               Plan - 12/07/16 1146    Clinical Impression Statement Pt has met remaining LTGs.  Pt feels confident on gait and balance in various situations, but does continue to report pain/difficulty with R knee (from arthritis and is aware of need to f/u with physician if needed).  Pt is appropriate for d/c at this time.   Rehab Potential Good   PT Frequency 2x / week   PT Duration 8 weeks  plus eval   PT Treatment/Interventions ADLs/Self Care Home Management;Gait training;DME Instruction;Neuromuscular re-education;Balance training;Therapeutic exercise;Functional mobility training;Therapeutic activities   PT Next Visit Plan D/C this visit   PT Home Exercise Plan Standing hip  extension/abduction/flexion using ankle weights if available   Consulted and Agree with Plan of Care Patient      Patient will benefit from skilled therapeutic intervention in order to improve the following deficits and impairments:  Abnormal gait, Decreased mobility, Decreased knowledge of use of DME, Difficulty walking, Decreased strength, Pain  Visit Diagnosis: Other abnormalities of gait and mobility       G-Codes - 12/18/2016 1148    Functional Assessment Tool Used (Outpatient Only) gt vel 4.6 ft/sec, DGI 22/24, TUG 9.19; gait 1000 ft outdoors independently   Functional Limitation Mobility: Walking and moving around   Mobility: Walking and Moving Around Goal Status [phone]) At least 1 percent but less than 20 percent impaired, limited or restricted   Mobility: Walking and Moving Around Discharge Status [phone]) At least 1 percent but less than 20 percent impaired, limited or restricted      Problem List Patient Active Problem List   Diagnosis Date Noted  . Hemiparesis affecting left side as late effect of stroke ([place]) 08/07/2016  . Osteoarthritis of right knee 08/07/2016  . Dysphagia, post-stroke 08/07/2016  . Sleep disturbance 08/07/2016  . Rash and nonspecific skin eruption 08/07/2016  . Left hemiparesis ([place])   . GERD (gastroesophageal reflux disease) 07/23/2016  . Basal ganglia infarction ([place]) 07/21/2016  . Stroke (cerebrum) ([place]) 07/19/2016  . HLD (hyperlipidemia) 08/14/2014  . Obesity (BMI 30-39.9) 08/17/2013  . Hyperlipidemia LDL goal < 100 02/15/2013  . TIA (transient ischemic attack) 12/14/2012  . Essential hypertension 12/14/2012  . Other and unspecified hyperlipidemia 12/14/2012    Peregrine Nolt W. Dec 18, 2016, 11:49 AM  Frazier Butt., PT   [facility] [address] [place], [place], [address] Phone: [phone]   Fax:  [phone]  Name: Catherine [name] MRN: [id number] Date of Birth:  09-24-1945   PHYSICAL THERAPY DISCHARGE SUMMARY  Visits from Start of Care: 12  Current functional level related to goals / functional outcomes: Pt has met all LTGs-see above   Remaining deficits: LLE weakness, balance; RLE  Pain (due to longstanding arthritis R knee)   Education / Equipment: Educated in CVA education, fall prevention, HEP, with pt verbalizing understanding  Plan: Patient agrees to discharge.  Patient goals were met. Patient is being discharged due to meeting the stated rehab goals.  ?????     Mady Haagensen, PT 12/18/2016 11:51 AM Phone: [phone] Fax: [phone]

## 2016-12-07 NOTE — Therapy (Signed)
[facility] [address] [place], [place], [address] Phone: [phone]   Fax:  [phone]  Speech Language Pathology Treatment  Patient Details  Name: Catherine [name] MRN: [id number] Date of Birth: 12-07-1945 Referring Provider: Sarina Ill, MD  Encounter Date: 12/07/2016      End of Session - 12/07/16 1228    Visit Number 6   Number of Visits 17   Date for SLP Re-Evaluation 01/22/17   SLP Start Time 0933   SLP Stop Time  7939   SLP Time Calculation (min) 42 min   Activity Tolerance Patient tolerated treatment well      Past Medical History:  Diagnosis Date  . Arthritis   . Cataract   . GERD (gastroesophageal reflux disease)   . Stroke ([place])   . TIA (transient ischemic attack)     Past Surgical History:  Procedure Laterality Date  . EYE SURGERY      There were no vitals filed for this visit.      Subjective Assessment - 12/07/16 0959    Subjective "I've had friends to tell me that I sound like I always did."               ADULT SLP TREATMENT - 12/07/16 0936      General Information   Behavior/Cognition Alert;Cooperative;Pleasant mood     Treatment Provided   Treatment provided Cognitive-Linquistic     Cognitive-Linquistic Treatment   Treatment focused on Dysarthria   Skilled Treatment SLP targeted abdominal breathing to improve vocal quality today. Pt with abdominal breathing (AB) 85% at rest, and 70% in simple to mod complex conversation. Pt rated her voice today at 8-9/10. SLP explained external cue to think aobut AB throughout the day. Pt agreeable to trying that. At end of session pt stated,"I hoped this was my last one today." SLP explained he owuld like to see more consistency with pt's vocal quality/status prior to d/c but would comply with pt's desires if she strongly wanted to cancel remaining ST sessions.      Assessment / Recommendations / Plan   Plan Continue with current  plan of care;Discharge SLP treatment due to (comment)  pt request     Progression Toward Goals   Progression toward goals --  see goals - d/c day          SLP Education - 12/07/16 1227    Education provided Yes   Education Details external cues for abdominal breathing   Person(s) Educated Patient   Methods Explanation   Comprehension Verbalized understanding          SLP Short Term Goals - 12/07/16 0937      SLP SHORT TERM GOAL #1   Title pt will use abdominal breathing (AB) at rest 85% of the time over 5 minute period x3 sessions   Baseline --   Time --   Period --   Status Partially Met     SLP SHORT TERM GOAL #2   Title pt will demo AB in 14/20 sentence responses x2 sessions   Baseline --   Time --   Period --   Status Partially Met     SLP SHORT TERM GOAL #3   Title pt will produce sentences with low 70s dB average loudness 80% of the time over 2 visits   Baseline --   Time --   Period --   Status Achieved     SLP SHORT TERM GOAL #4  Title pt will undergo cognitive linguistic assessment    Status Deferred          SLP Long Term Goals - Dec 25, 2016 0955      SLP LONG TERM GOAL #1   Title pt will demo AB 70% of the time in 10 minutes simple conversaiton x3 visits   Baseline --   Time --   Period --   Status Partially Met     SLP LONG TERM GOAL #2   Title pt will improve conversational loudness to low 70s dB average, in 10 minutes simple conversation  x3   Baseline --   Time --   Period --   Status Partially Met     SLP LONG TERM GOAL #3   Title pt will demo adequate respiratory support 80% of the time in 7 minutes mod complex conversation   Time --   Period --   Status Partially Met          Plan - 12-25-2016 1228    Clinical Impression Statement Pt presents today with more frequent abdominal breathing (AB) than last week, both in structured tasks and in conversation. Pt had friends tell her last week that her voice sounded WNL. Skilled ST  would be beneficial to ensure consistency with improved voice quality and use of AB, however pt requests d/c today.    Treatment/Interventions Compensatory strategies;Patient/family education;Functional tasks;SLP instruction and feedback;Multimodal communcation approach;Cognitive reorganization;Environmental controls   Potential to Achieve Goals Good   Consulted and Agree with Plan of Care Patient      Patient will benefit from skilled therapeutic intervention in order to improve the following deficits and impairments:   Dysarthria and anarthria      G-Codes - 12-25-2016 1231    Functional Assessment Tool Used noms   Functional Limitations Motor speech   Motor Speech Goal Status [phone]) At least 1 percent but less than 20 percent impaired, limited or restricted   Motor Speech Goal Status (X4128) At least 1 percent but less than 20 percent impaired, limited or restricted      [place]  Visits from Start of Care: 6  Current functional level related to goals / functional outcomes: Pt was on her way to making some good to excellent changes in her voice due to dysarthria caused by CVA. Pt stated she was hoping she could be discharged today, due to other therapies (PT, OT) which started prior to ST were discharged today and pt remarked she did not want to come for just one therapy. SLP told pt he would like be ensured of more consistency of abdominal breathing and of cont'd improved voice quality prior to d/c but pt stated she was satisfied with current rehab level.   Remaining deficits: Mild dysarthria, specifically with pt's voice.   Education / Equipment: Voice HEP, abdominal breathing  Plan: Patient agrees to discharge.  Patient goals were partially met. Patient is being discharged due to being pleased with the current functional level.  ?????       Problem List Patient Active Problem List   Diagnosis Date Noted  . Hemiparesis affecting left side as late  effect of stroke ([facility]) 08/07/2016  . Osteoarthritis of right knee 08/07/2016  . Dysphagia, post-stroke 08/07/2016  . Sleep disturbance 08/07/2016  . Rash and nonspecific skin eruption 08/07/2016  . Left hemiparesis ([facility])   . GERD (gastroesophageal reflux disease) 07/23/2016  . Basal ganglia infarction ([facility]) 07/21/2016  . Stroke (cerebrum) ([place]) 07/19/2016  .  HLD (hyperlipidemia) 08/14/2014  . Obesity (BMI 30-39.9) 08/17/2013  . Hyperlipidemia LDL goal < 100 02/15/2013  . TIA (transient ischemic attack) 12/14/2012  . Essential hypertension 12/14/2012  . Other and unspecified hyperlipidemia 12/14/2012    [facility] ,[place], [place]  12/07/2016, 12:31 PM  [facility] [address] [place], [place], [address] Phone: [phone]   Fax:  [phone]   Name: Catherine [name] MRN: [id number] Date of Birth: Jun 27, 1945

## 2016-12-07 NOTE — Patient Instructions (Signed)
Think about keeping a bracelet/rubber band on your wrist to remind you to use abdominal breathing.

## 2016-12-07 NOTE — Therapy (Signed)
[facility] [address] [place], [place], [address] Phone: [phone]   Fax:  [phone]  Occupational Therapy Treatment  Patient Details  Name: Catherine [name] MRN: [id number] Date of Birth: 07/14/1945 Referring Provider: Dr. Sarina Ill  Encounter Date: 12/07/2016      OT End of Session - 12/07/16 1046    Visit Number 12   Number of Visits 17   Date for OT Re-Evaluation 12/12/16   Authorization Type MCR/BCBS - G code needed   Authorization - Visit Number 12   Authorization - Number of Visits 20   OT Start Time 3818   OT Stop Time 1100   OT Time Calculation (min) 45 min   Activity Tolerance Patient tolerated treatment well      Past Medical History:  Diagnosis Date  . Arthritis   . Cataract   . GERD (gastroesophageal reflux disease)   . Stroke ([facility])   . TIA (transient ischemic attack)     Past Surgical History:  Procedure Laterality Date  . EYE SURGERY      There were no vitals filed for this visit.      Subjective Assessment - 12/07/16 1020    Subjective  Everything is going good   Pertinent History 07/19/16: CVA.  PMH: TIA 2014, OA, GERD, Rt TKR 2008   Limitations No lifting > 10 lbs   Patient Stated Goals improve fine motor skills   Currently in Pain? No/denies                      OT Treatments/Exercises (OP) - 12/07/16 0001      Shoulder Exercises: ROM/Strengthening   UBE (Upper Arm Bike) UBE x 10 min. Level 5   Other ROM/Strengthening Exercises Seated: BUE shoulder flexion to 90* with 2 lb. weight in each hand (palms facing), followed by higher range, with only 1 2 lb. weight in both hands.    Other ROM/Strengthening Exercises Pt placing 5 lb. weight on overhead shelf 5 times     Fine Motor Coordination   Fine Motor Coordination In hand manipuation training   In Hand Manipulation Training Translating stress balls in Lt hand. Followed by in hand manipulation to pick up  5 medium sized pegs (one at a time until 5 is in hand) then removing 1 at a time to place in pegboard with min drops                   OT Short Term Goals - 11/30/16 1042      OT SHORT TERM GOAL #1   Title Independent with coordination and putty HEP for Lt hand (STG's due 11/12/16)    Time 4   Period Weeks   Status Achieved     OT SHORT TERM GOAL #2   Title Pt to be independent with strengthening HEP for LUE   Time 4   Period Weeks   Status Achieved     OT SHORT TERM GOAL #3   Title Pt to attend to Lt side with simple multi-tasking activity while carrying plate and/or cup of water w/o drops/spills Lt hand   Time 4   Period Weeks   Status Achieved     OT SHORT TERM GOAL #4   Title Pt to perform high level LUE reaching to retrieve/replace 2 lb. object from high shelf with min compensations    Time 4   Period Weeks   Status Achieved  OT Long Term Goals - 15-Dec-2016 1027      OT LONG TERM GOAL #1   Title Improve coordination Lt hand as evidenced by reducing speed on 9 hole peg test to 28 sec. or less (LTG's due 12/12/16)   Baseline 35.72 sec. (Rt = 21.25 sec)    Status Achieved  26.78 sec     OT LONG TERM GOAL #2   Title Improve grip strength Lt hand to 50 lbs or greater to assist with opening jars, containers, etc   Baseline eval = 45 lbs (Rt = 60 lbs)    Status Achieved     OT LONG TERM GOAL #3   Title Pt to retrieve/replace 5 lb object from overheadl shelf LUE 5/5 trials with no compensations   Status Partially Met  with min compensations               Plan - 12-15-16 1036    Clinical Impression Statement Pt met all LTG's.    Rehab Potential Good   OT Treatment/Interventions Self-care/ADL training;DME and/or AE instruction;Patient/family education;Therapeutic exercises;Therapeutic activities;Neuromuscular education;Functional Mobility Training;Passive range of motion;Visual/perceptual remediation/compensation   Plan D/C O.T.    OT Home  Exercise Plan Education provided:  Red putty/Coordination HEP, strengthening HEP LUE   Consulted and Agree with Plan of Care Patient      Patient will benefit from skilled therapeutic intervention in order to improve the following deficits and impairments:  Decreased coordination, Decreased range of motion, Impaired flexibility, Improper body mechanics, Impaired sensation, Decreased endurance, Decreased activity tolerance, Impaired UE functional use, Decreased mobility, Decreased strength, Impaired perceived functional ability  Visit Diagnosis: Muscle weakness (generalized)  Other lack of coordination      G-Codes - 2016/12/15 1051    Functional Assessment Tool Used (Outpatient only) LUE: decreased dropping Lt hand, 9 hole peg test = 28 sec. Grip strength = 55 lbs, increased strength and ROM   Functional Limitation Carrying, moving and handling objects   Carrying, Moving and Handling Objects Goal Status (H8469) At least 1 percent but less than 20 percent impaired, limited or restricted   Carrying, Moving and Handling Objects Discharge Status [phone]) At least 1 percent but less than 20 percent impaired, limited or restricted      Problem List Patient Active Problem List   Diagnosis Date Noted  . Hemiparesis affecting left side as late effect of stroke ([place]) 08/07/2016  . Osteoarthritis of right knee 08/07/2016  . Dysphagia, post-stroke 08/07/2016  . Sleep disturbance 08/07/2016  . Rash and nonspecific skin eruption 08/07/2016  . Left hemiparesis ([place])   . GERD (gastroesophageal reflux disease) 07/23/2016  . Basal ganglia infarction ([place]) 07/21/2016  . Stroke (cerebrum) ([place]) 07/19/2016  . HLD (hyperlipidemia) 08/14/2014  . Obesity (BMI 30-39.9) 08/17/2013  . Hyperlipidemia LDL goal < 100 02/15/2013  . TIA (transient ischemic attack) 12/14/2012  . Essential hypertension 12/14/2012  . Other and unspecified hyperlipidemia 12/14/2012     OCCUPATIONAL THERAPY DISCHARGE  SUMMARY  Visits from Start of Care: 12  Current functional level related to goals / functional outcomes: See above   Remaining deficits: LUE weakness Mild in hand manipulation deficits Lt hand   Education / Equipment: HEP's, proper positioning of LUE  Plan: Patient agrees to discharge.  Patient goals were met. Patient is being discharged due to meeting the stated rehab goals.  ?????        Carey Bullocks, OTR/L 12/15/2016, 10:52 AM  [facility]  [place], [place], [address] Phone: [phone]   Fax:  [phone]  Name: Catherine [name] MRN: [id number] Date of Birth: 1945-06-07

## 2016-12-08 ENCOUNTER — Encounter: Payer: Medicare Other | Admitting: Speech Pathology

## 2016-12-09 ENCOUNTER — Encounter: Payer: Medicare Other | Admitting: Occupational Therapy

## 2016-12-09 ENCOUNTER — Ambulatory Visit: Payer: Medicare Other | Admitting: Physical Therapy

## 2016-12-10 ENCOUNTER — Encounter: Payer: Medicare Other | Admitting: Speech Pathology

## 2016-12-14 ENCOUNTER — Ambulatory Visit: Payer: Medicare Other | Admitting: Physical Therapy

## 2016-12-14 ENCOUNTER — Encounter: Payer: Medicare Other | Admitting: Speech Pathology

## 2016-12-15 ENCOUNTER — Encounter: Payer: Medicare Other | Admitting: Speech Pathology

## 2016-12-16 ENCOUNTER — Ambulatory Visit: Payer: Medicare Other | Admitting: Physical Therapy

## 2016-12-22 ENCOUNTER — Encounter: Payer: Medicare Other | Admitting: Speech Pathology

## 2016-12-24 ENCOUNTER — Encounter: Payer: Medicare Other | Admitting: Speech Pathology

## 2016-12-29 ENCOUNTER — Encounter: Payer: Medicare Other | Admitting: Speech Pathology

## 2017-01-04 ENCOUNTER — Encounter: Payer: Medicare Other | Admitting: Speech Pathology

## 2017-01-05 ENCOUNTER — Encounter: Payer: Medicare Other | Admitting: Speech Pathology

## 2017-01-15 ENCOUNTER — Ambulatory Visit: Payer: Medicare Other | Admitting: Physical Medicine & Rehabilitation

## 2017-01-27 ENCOUNTER — Encounter: Payer: Medicare Other | Attending: Physical Medicine & Rehabilitation | Admitting: Physical Medicine & Rehabilitation

## 2017-01-27 ENCOUNTER — Encounter: Payer: Self-pay | Admitting: Physical Medicine & Rehabilitation

## 2017-01-27 VITALS — BP 157/86 | HR 73

## 2017-01-27 DIAGNOSIS — H268 Other specified cataract: Secondary | ICD-10-CM | POA: Insufficient documentation

## 2017-01-27 DIAGNOSIS — I639 Cerebral infarction, unspecified: Secondary | ICD-10-CM | POA: Diagnosis not present

## 2017-01-27 DIAGNOSIS — Z818 Family history of other mental and behavioral disorders: Secondary | ICD-10-CM | POA: Diagnosis not present

## 2017-01-27 DIAGNOSIS — M1711 Unilateral primary osteoarthritis, right knee: Secondary | ICD-10-CM

## 2017-01-27 DIAGNOSIS — I69354 Hemiplegia and hemiparesis following cerebral infarction affecting left non-dominant side: Secondary | ICD-10-CM | POA: Insufficient documentation

## 2017-01-27 DIAGNOSIS — K219 Gastro-esophageal reflux disease without esophagitis: Secondary | ICD-10-CM | POA: Insufficient documentation

## 2017-01-27 DIAGNOSIS — E669 Obesity, unspecified: Secondary | ICD-10-CM | POA: Insufficient documentation

## 2017-01-27 DIAGNOSIS — Z6829 Body mass index (BMI) 29.0-29.9, adult: Secondary | ICD-10-CM | POA: Insufficient documentation

## 2017-01-27 DIAGNOSIS — I69391 Dysphagia following cerebral infarction: Secondary | ICD-10-CM | POA: Insufficient documentation

## 2017-01-27 DIAGNOSIS — G479 Sleep disorder, unspecified: Secondary | ICD-10-CM | POA: Insufficient documentation

## 2017-01-27 DIAGNOSIS — R269 Unspecified abnormalities of gait and mobility: Secondary | ICD-10-CM | POA: Diagnosis not present

## 2017-01-27 DIAGNOSIS — M1990 Unspecified osteoarthritis, unspecified site: Secondary | ICD-10-CM | POA: Insufficient documentation

## 2017-01-27 DIAGNOSIS — Z809 Family history of malignant neoplasm, unspecified: Secondary | ICD-10-CM | POA: Insufficient documentation

## 2017-01-27 DIAGNOSIS — G8194 Hemiplegia, unspecified affecting left nondominant side: Secondary | ICD-10-CM | POA: Diagnosis not present

## 2017-01-27 DIAGNOSIS — Z96651 Presence of right artificial knee joint: Secondary | ICD-10-CM | POA: Diagnosis not present

## 2017-01-27 DIAGNOSIS — Z823 Family history of stroke: Secondary | ICD-10-CM | POA: Diagnosis not present

## 2017-01-27 DIAGNOSIS — I6381 Other cerebral infarction due to occlusion or stenosis of small artery: Secondary | ICD-10-CM

## 2017-01-27 NOTE — Progress Notes (Signed)
Subjective:    [name], female    DOB: 04-12-1946, 71 y.o.   MRN: [id number]  HPI 71 y.o. RH female with history of TIA August 19, 2012, OA , GERD presents for follow up after right basal ganglia infarct with remote left thalamic lacunar infracts.  Last clinic visit 10/15/16.  Since last visit, she has completed therapies and is now doing HEP.  She is follow up with Neurology. His right knee pain is on/off, for which she is using Voltaren gel PRN.  Her dysphagia is stable, but still needs to swallow slowly.  Her sleep is on/off with ?apnea. No longer requires a cane.  Denies falls.   Pain Inventory Average Pain 0 Pain Right Now 0 My pain is intermittent and aching  In the last 24 hours, has pain interfered with the following? General activity 5 Relation with others 10 Enjoyment of life 8 What TIME of day is your pain at its worst? na Sleep (in general) Good  Pain is worse with: unsure Pain improves with: rest Relief from Meds: 6  Mobility use a cane use a walker ability to climb steps?  yes do you drive?  no  Function not employed: date last employed . I need assistance with the following:  bathing, meal prep, household duties and shopping  Neuro/Psych weakness trouble walking loss of taste or smell  Prior Studies Any changes since last visit?  no  Physicians involved in your care Primary care dr. Nancy Fetter   Family History  Problem Relation Age of Onset  . Dementia Mother   . Cancer Mother   . Stroke Mother   . Cancer Father    Social History   Social History  . Marital status: Widowed    Spouse name: N/A  . Number of children: 3  . Years of education: College   Occupational History  . Retired    Social History Main Topics  . Smoking status: Never Smoker  . Smokeless tobacco: Never Used  . Alcohol use No  . Drug use: No  . Sexual activity: Yes   Other Topics Concern  . None   Social History Narrative   Husband passed away in 2013/08/19.   Now lives w/ her daughter.   Past Surgical History:  Procedure Laterality Date  . EYE SURGERY     Past Medical History:  Diagnosis Date  . Arthritis   . Cataract   . GERD (gastroesophageal reflux disease)   . Stroke ([place])   . TIA (transient ischemic attack)    BP (!) 157/86   Pulse 73   SpO2 98%   Opioid Risk Score:   Fall Risk Score:  `1  Depression screen PHQ 2/9  No flowsheet data found.  Review of Systems  Constitutional: Positive for unexpected weight change.  HENT: Negative.   Eyes: Negative.   Respiratory: Positive for cough and shortness of breath.   Cardiovascular: Negative.   Gastrointestinal: Positive for constipation.  Endocrine: Negative.   Genitourinary: Negative.   Musculoskeletal: Negative.   Skin: Positive for rash.  Allergic/Immunologic: Negative.   Neurological: Negative.        Dysesthesias  Hematological: Bruises/bleeds easily.  Psychiatric/Behavioral: Negative.   All other systems reviewed and are negative.     Objective:   Physical Exam  Gen NAD. Vital signs reviewed.  HEENT: Normocephalic, atraumatic. Cardio: RRR and no murmur Resp: CTA B/L and Unlabored GI: BS positive and NT Musc/Skel:  No edema. No tenderness Gait: antalgic Neuro: Alert/Oriented  x3 Motor 5/5 in RUE and RLE LUE: 4+/5 proximal to distal LLE: HF 4-4+/5, KE, ADF/PF 4+/5  Mild Dysarthria Left facial droop No increase in tone Skin:   Warm and dry.  Psych: Normal mood and behavior.     Assessment & Plan:  71 y.o. RH female with history of TIA 2014, OA , GERD presents for follow up after right basal ganglia infarct with remote left thalamic lacunar infracts.  1. Left hemiparesis and functional deficits secondary to right basal ganglia infarct  Cont HEP  Cont follow up with Neurology  Cont meds  2. Right knee OA s/p TKA in 2008  Cont conservative measures  Cont Voltaren gel PRN  Avoid oral NSAIDs at present  3. Dysphagia   Encouraged small bites and limit  distraction  4. Sleep disturbance  Encouraged follow up with PCP for ?sleep study  Encouraged sleep hygiene  5. Diet/Obesity  Referral made to dietitian due to numerous questions regarding supplements and improved diet, but pt did not follow up and states she will consider again in future  6. Gait abnormality  Cont cane for safety PRN  Cont HEP  >25 minutes spent with patient, with >20 minutes spent in education regarding stroke, prognosis, symptoms, medications

## 2017-02-23 DIAGNOSIS — Z23 Encounter for immunization: Secondary | ICD-10-CM | POA: Diagnosis not present

## 2017-03-03 ENCOUNTER — Emergency Department (HOSPITAL_COMMUNITY): Payer: Medicare Other

## 2017-03-03 ENCOUNTER — Encounter (HOSPITAL_COMMUNITY): Payer: Self-pay | Admitting: *Deleted

## 2017-03-03 ENCOUNTER — Emergency Department (HOSPITAL_COMMUNITY)
Admission: EM | Admit: 2017-03-03 | Discharge: 2017-03-03 | Disposition: A | Discharge status: Home / Self Care | Payer: Medicare Other | Attending: Emergency Medicine | Admitting: Emergency Medicine

## 2017-03-03 ENCOUNTER — Other Ambulatory Visit: Payer: Self-pay

## 2017-03-03 DIAGNOSIS — I69352 Hemiplegia and hemiparesis following cerebral infarction affecting left dominant side: Secondary | ICD-10-CM | POA: Insufficient documentation

## 2017-03-03 DIAGNOSIS — W19XXXA Unspecified fall, initial encounter: Secondary | ICD-10-CM

## 2017-03-03 DIAGNOSIS — Z961 Presence of intraocular lens: Secondary | ICD-10-CM | POA: Diagnosis not present

## 2017-03-03 DIAGNOSIS — Z7902 Long term (current) use of antithrombotics/antiplatelets: Secondary | ICD-10-CM | POA: Diagnosis not present

## 2017-03-03 DIAGNOSIS — M5481 Occipital neuralgia: Secondary | ICD-10-CM | POA: Diagnosis not present

## 2017-03-03 DIAGNOSIS — Y92002 Bathroom of unspecified non-institutional (private) residence single-family (private) house as the place of occurrence of the external cause: Secondary | ICD-10-CM | POA: Diagnosis not present

## 2017-03-03 DIAGNOSIS — W0110XA Fall on same level from slipping, tripping and stumbling with subsequent striking against unspecified object, initial encounter: Secondary | ICD-10-CM | POA: Diagnosis not present

## 2017-03-03 DIAGNOSIS — Y9389 Activity, other specified: Secondary | ICD-10-CM | POA: Diagnosis not present

## 2017-03-03 DIAGNOSIS — R51 Headache: Secondary | ICD-10-CM | POA: Insufficient documentation

## 2017-03-03 DIAGNOSIS — I10 Essential (primary) hypertension: Secondary | ICD-10-CM | POA: Diagnosis not present

## 2017-03-03 DIAGNOSIS — Z79899 Other long term (current) drug therapy: Secondary | ICD-10-CM | POA: Insufficient documentation

## 2017-03-03 DIAGNOSIS — S0003XA Contusion of scalp, initial encounter: Secondary | ICD-10-CM

## 2017-03-03 DIAGNOSIS — H401112 Primary open-angle glaucoma, right eye, moderate stage: Secondary | ICD-10-CM | POA: Diagnosis not present

## 2017-03-03 DIAGNOSIS — S199XXA Unspecified injury of neck, initial encounter: Secondary | ICD-10-CM | POA: Diagnosis not present

## 2017-03-03 DIAGNOSIS — S0990XA Unspecified injury of head, initial encounter: Secondary | ICD-10-CM | POA: Diagnosis not present

## 2017-03-03 DIAGNOSIS — Y998 Other external cause status: Secondary | ICD-10-CM | POA: Diagnosis not present

## 2017-03-03 DIAGNOSIS — Y92009 Unspecified place in unspecified non-institutional (private) residence as the place of occurrence of the external cause: Secondary | ICD-10-CM

## 2017-03-03 DIAGNOSIS — H401121 Primary open-angle glaucoma, left eye, mild stage: Secondary | ICD-10-CM | POA: Diagnosis not present

## 2017-03-03 HISTORY — DX: Unspecified glaucoma: H40.9

## 2017-03-03 NOTE — ED Notes (Signed)
CT called RE pt.

## 2017-03-03 NOTE — ED Provider Notes (Signed)
[facility] EMERGENCY DEPARTMENT Provider Note   CSN: [id number] Arrival date & time: 03/03/17  1631     History   Chief Complaint Chief Complaint  Patient presents with  . Fall    on Plavix    HPI Catherine [name] is a 71 y.o. female.  The history is provided by the patient.  Fall   She is status post stroke with left hemiparesis and has poor balance because of that.  She was trying to rush to get to the bathroom when she lost her balance and fell striking her occiput.  There was no loss of consciousness.  There is no dizziness.  There is no nausea or vomiting.  However, she is taking clopidogrel.  Past Medical History:  Diagnosis Date  . Arthritis   . Cataract   . GERD (gastroesophageal reflux disease)   . Glaucoma   . Stroke ([place])   . TIA (transient ischemic attack)     Patient Active Problem List   Diagnosis Date Noted  . Hemiparesis affecting left side as late effect of stroke ([place]) 08/07/2016  . Osteoarthritis of right knee 08/07/2016  . Dysphagia, post-stroke 08/07/2016  . Sleep disturbance 08/07/2016  . Rash and nonspecific skin eruption 08/07/2016  . Left hemiparesis ([place])   . GERD (gastroesophageal reflux disease) 07/23/2016  . Basal ganglia infarction ([place]) 07/21/2016  . Stroke (cerebrum) ([place]) 07/19/2016  . HLD (hyperlipidemia) 08/14/2014  . Obesity (BMI 30-39.9) 08/17/2013  . Hyperlipidemia LDL goal < 100 02/15/2013  . TIA (transient ischemic attack) 12/14/2012  . Essential hypertension 12/14/2012  . Other and unspecified hyperlipidemia 12/14/2012    Past Surgical History:  Procedure Laterality Date  . EYE SURGERY      OB History    No data available       Home Medications    Prior to Admission medications   Medication Sig Start Date End Date Taking? Authorizing Provider  Calcium Citrate (CITRACAL PO) Take 2 tablets daily by mouth.    Yes [provider]  clopidogrel (PLAVIX) 75 MG tablet Take 1 tablet (75 mg  total) by mouth daily. 07/29/16  Yes Love, Ivan Anchors, PA-C  Cyanocobalamin (B-12 PO) Take 1 tablet by mouth daily.   Yes [provider]  diclofenac sodium (VOLTAREN) 1 % GEL Apply 2 g topically 4 (four) times daily. Patient taking differently: Apply 2 g 4 (four) times daily as needed topically (to painful sites).  10/15/16  Yes Jamse Arn, MD  NON FORMULARY Kyolic Garlic extract capsules; Take 2 capsules by mouth two times a day   Yes [provider]  NON FORMULARY Juice Plus Omega Oil Capsules: Take 2 capsules by mouth once a day   Yes [provider]  sodium chloride (OCEAN) 0.65 % SOLN nasal spray Place 1 spray into both nostrils 2 (two) times daily. Patient taking differently: Place 1 spray 2 (two) times daily as needed into both nostrils for congestion.  07/29/16  Yes Love, Ivan Anchors, PA-C  UNABLE TO FIND Juice Plus shakes: Drink 1 shake by mouth once a day   Yes [provider]  pantoprazole (PROTONIX) 40 MG tablet Take 1 tablet (40 mg total) by mouth daily. Patient not taking: Reported on 03/03/2017 07/30/16   Love, Ivan Anchors, PA-C  simvastatin (ZOCOR) 40 MG tablet Take 1 tablet (40 mg total) by mouth at bedtime. Patient not taking: Reported on 03/03/2017 07/29/16   Love, Pecolia Ades    Family History Family  History  Problem Relation Age of Onset  . Dementia Mother   . Cancer Mother   . Stroke Mother   . Cancer Father     Social History Social History   Tobacco Use  . Smoking status: Never Smoker  . Smokeless tobacco: Never Used  Substance Use Topics  . Alcohol use: No  . Drug use: No     Allergies   Zocor [simvastatin]; Crestor [rosuvastatin calcium]; Lipitor [atorvastatin]; and Statins   Review of Systems Review of Systems  All other systems reviewed and are negative.    Physical Exam Updated Vital Signs BP 135/66   Pulse 77   Temp (!) 97.5 F (36.4 C) (Oral)   Resp 17   Ht 5\' 3"  (1.6 m)   Wt 76.7 kg (169 lb)   SpO2 97%    BMI 29.94 kg/m   Physical Exam  Nursing note and vitals reviewed.  71 year old female, resting comfortably and in no acute distress. Vital signs are normal. Oxygen saturation is 97%, which is normal. Head is normocephalic.  2 cm hematoma present on left side of occiput. PERRLA, EOMI. Oropharynx is clear. Neck is nontender and supple without adenopathy or JVD. Back is nontender and there is no CVA tenderness. Lungs are clear without rales, wheezes, or rhonchi. Chest is nontender. Heart has regular rate and rhythm without murmur. Abdomen is soft, flat, nontender without masses or hepatosplenomegaly and peristalsis is normoactive. Extremities have no cyanosis or edema, full range of motion is present. Skin is warm and dry without rash. Neurologic: Mental status is normal, cranial nerves are intact, there is moderate left hemiparesis present.  ED Treatments / Results   Radiology Ct Head Wo Contrast  Result Date: 03/03/2017 CLINICAL DATA:  Patient fell hitting head in the bathroom. Patient presents with occipital pain. EXAM: CT HEAD WITHOUT CONTRAST CT CERVICAL SPINE WITHOUT CONTRAST TECHNIQUE: Multidetector CT imaging of the head and cervical spine was performed following the standard protocol without intravenous contrast. Multiplanar CT image reconstructions of the cervical spine were also generated. COMPARISON:  07/20/2016 MRI of the head, 12/14/2012 CT FINDINGS: CT HEAD FINDINGS Brain: Chronic right basal ganglial lacunar infarct. Mild superficial atrophy with chronic mild-to-moderate small vessel ischemic disease of periventricular white matter. No acute intracranial hemorrhage, midline shift or edema. No large vascular territory infarct. Vascular: Moderate atherosclerosis of the carotid siphons. No hyperdense appearing vessels. Skull: Negative for fracture or focal lesion. Sinuses/Orbits: Mild ethmoid sinus mucosal thickening. Bilateral lens replacements surgeries. Other: None. CT CERVICAL  SPINE FINDINGS Alignment: Normal. Skull base and vertebrae: No acute fracture. No primary bone lesion or focal pathologic process. Soft tissues and spinal canal: No prevertebral fluid or swelling. No visible canal hematoma. Disc levels: Mild-to-moderate disc space narrowing C5-6 with tiny posterior marginal osteophytes. No significant central or neural foraminal encroachment. No jumped or perched appearing facets. There is dextroconvex curvature of the upper cervical spine with associated left-sided C3-4 and C4-5 facet joint space narrowing, hypertrophy and sclerosis consistent with facet arthropathy. Uncovertebral joint osteoarthritis with uncinate process spurring is seen bilaterally at C5-6. Upper chest: Negative. Other: None IMPRESSION: 1. Chronic stable cerebral atrophy with small vessel ischemic disease. 2. No acute intracranial abnormality. 3. No acute cervical spine fracture nor posttraumatic subluxation. 4. Dextroconvex curvature of the cervical spine with degenerative facet arthropathy, uncovertebral joint osteoarthritis and mild-to-moderate degenerative disc space narrowing at C5-6 as above noted. Electronically Signed   By: Ashley Royalty M.D.   On: 03/03/2017 18:08  Ct Cervical Spine Wo Contrast  Result Date: 03/03/2017 CLINICAL DATA:  Patient fell hitting head in the bathroom. Patient presents with occipital pain. EXAM: CT HEAD WITHOUT CONTRAST CT CERVICAL SPINE WITHOUT CONTRAST TECHNIQUE: Multidetector CT imaging of the head and cervical spine was performed following the standard protocol without intravenous contrast. Multiplanar CT image reconstructions of the cervical spine were also generated. COMPARISON:  07/20/2016 MRI of the head, 12/14/2012 CT FINDINGS: CT HEAD FINDINGS Brain: Chronic right basal ganglial lacunar infarct. Mild superficial atrophy with chronic mild-to-moderate small vessel ischemic disease of periventricular white matter. No acute intracranial hemorrhage, midline shift or  edema. No large vascular territory infarct. Vascular: Moderate atherosclerosis of the carotid siphons. No hyperdense appearing vessels. Skull: Negative for fracture or focal lesion. Sinuses/Orbits: Mild ethmoid sinus mucosal thickening. Bilateral lens replacements surgeries. Other: None. CT CERVICAL SPINE FINDINGS Alignment: Normal. Skull base and vertebrae: No acute fracture. No primary bone lesion or focal pathologic process. Soft tissues and spinal canal: No prevertebral fluid or swelling. No visible canal hematoma. Disc levels: Mild-to-moderate disc space narrowing C5-6 with tiny posterior marginal osteophytes. No significant central or neural foraminal encroachment. No jumped or perched appearing facets. There is dextroconvex curvature of the upper cervical spine with associated left-sided C3-4 and C4-5 facet joint space narrowing, hypertrophy and sclerosis consistent with facet arthropathy. Uncovertebral joint osteoarthritis with uncinate process spurring is seen bilaterally at C5-6. Upper chest: Negative. Other: None IMPRESSION: 1. Chronic stable cerebral atrophy with small vessel ischemic disease. 2. No acute intracranial abnormality. 3. No acute cervical spine fracture nor posttraumatic subluxation. 4. Dextroconvex curvature of the cervical spine with degenerative facet arthropathy, uncovertebral joint osteoarthritis and mild-to-moderate degenerative disc space narrowing at C5-6 as above noted. Electronically Signed   By: Ashley Royalty M.D.   On: 03/03/2017 18:08    Procedures Procedures (including critical care time)  Medications Ordered in ED Medications - No data to display   Initial Impression / Assessment and Plan / ED Course  I have reviewed the triage vital signs and the nursing notes.  Pertinent imaging results that were available during my care of the patient were reviewed by me and considered in my medical decision making (see chart for details).  Fall with contusion of occiput.   Patient on antiplatelet agents.  Old records are reviewed, confirming stroke this year with left hemiparesis.  She is sent for CT of head and cervical spine which showed no acute process.  She is discharged with instructions to watch for signs of delayed bleeding.  Final Clinical Impressions(s) / ED Diagnoses   Final diagnoses:  None    ED Discharge Orders    None       Delora Fuel, MD 77/82/42 [id number]

## 2017-03-03 NOTE — Discharge Instructions (Signed)
Apply ice several times a day. Take acetaminophen (Tylenol) as needed for pain. Watch for delayed signs of a head injury over the next 24-48 hours. If anything worrisome occurs, then return to the emergency department.

## 2017-03-03 NOTE — ED Triage Notes (Signed)
Pt to ED after falling in bathroom and hitting head on "something"  While falling and landing in between commode and wall.  No loc.  Pt states fell at 3 pm and began experiencing headache at 3:45 pm.  Denies nausea, dizziness or blurred vision. PERRL.  No weakness to limbs noted, though pt states headache is getting worse.  Pt taking plavix.  Pt tripped while hurrying to bathroom.

## 2017-03-17 DIAGNOSIS — Z1231 Encounter for screening mammogram for malignant neoplasm of breast: Secondary | ICD-10-CM | POA: Diagnosis not present

## 2017-03-17 DIAGNOSIS — M85852 Other specified disorders of bone density and structure, left thigh: Secondary | ICD-10-CM | POA: Diagnosis not present

## 2017-03-25 ENCOUNTER — Encounter: Payer: Self-pay | Admitting: Adult Health

## 2017-03-25 ENCOUNTER — Ambulatory Visit (INDEPENDENT_AMBULATORY_CARE_PROVIDER_SITE_OTHER): Payer: Medicare Other | Admitting: Adult Health

## 2017-03-25 VITALS — BP 156/79 | HR 75 | Wt 171.4 lb

## 2017-03-25 DIAGNOSIS — Z8673 Personal history of transient ischemic attack (TIA), and cerebral infarction without residual deficits: Secondary | ICD-10-CM | POA: Diagnosis not present

## 2017-03-25 DIAGNOSIS — I639 Cerebral infarction, unspecified: Secondary | ICD-10-CM

## 2017-03-25 NOTE — Progress Notes (Signed)
Personally  participated in, made any corrections needed, and agree with history, physical, neuro exam,assessment and plan as stated above.    Antonia Ahern, MD [facility] 

## 2017-03-25 NOTE — Progress Notes (Signed)
PATIENT: Catherine [name] DOB: [birth year]  REASON FOR VISIT: follow up HISTORY FROM: patient  HISTORY OF PRESENT ILLNESS: Today 03/25/17 Catherine [name] is a 71 year old female with a history of stroke.  She returns today for follow-up.  She is currently on Plavix and tolerating it well.  She reports that she does have some occasional bruising but nothing significant.  She continues to have some residual weakness on the left side.  She is able to complete all ADLs independently.  She operates a Teacher, music without difficulty.  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  She states that she has been following with her primary care.  She has been unable to tolerate statin medications for her cholesterol.  She is currently not on any medication for her cholesterol.  Her blood pressure is slightly elevated today.  Patient returns today for an evaluation.  HISTORY Catherine [name] is a 71 y.o. female here as a referral from [facility] after discharge. Past medical history arthritis, TIA. Per review of notes patient presented on 07/19/2016 with headache, right facial numbness, right lower extremity weakness, speech changes and difficulty walking. Earlier that day she felt extremely tired and headache and was speaking more slowly than normal. She felt unsteady walking. She noted she had a facial droop and possible weakness of the left arm. When evaluated by neurology she was not endorsing any symptoms apart from ongoing sensory changes in the right side of her face and a mild headache. She had a reported TIA in 2014. She takes aspirin 81 mg daily. Symptoms were resolved when neurologist examined her in the emergency room. Symptoms were in the setting of lots of family stress. When the stroke team saw her she still reported left facial droop and mild left hand and left foot weakness. MRI showed patchy small volume acute ischemic nonhemorrhagic right basal ganglia infarcts  and left remote thalamic lacunar infarct. Patient was changed to Plavix. EEG was negative for epileptiform or seizure activity. She is having difficulty with walking due to back problems and knee pain.   Patient says she wa singing in the choir. She noticed her left leg a little weak.She was slow. She went to eat. She wasn;t feeling well. Here with daughter who provides much information. Her leg became weaker and she couldn;t walk. She went to the ED. She went right in. She has back pain and isn't walking quite right, the weakness has improved. She still feels tired. She still can't sing. She has weakness on the left hand and it doesn't work. She is using it constantly and notices the fine motor. She has some coughing but no choking. She is taking her time. Daughter is here with her and also provides information. Patient says she worries about Zocor because she feels fatigued and her brother had to go down negatives, I encouraged her to continue it as managing cholesterol is very important stroke prevention. She worries about nosebleeds with Plavix, she has not had any or any increased symptoms or signs of bleeding, also discussed the sitting on the Plavix is important in prevention of strokes.  REVIEW OF SYSTEMS: Out of a complete 14 system review of symptoms, the patient complains only of the following symptoms, and all other reviewed systems are negative.  Shortness of breath, ringing in ears, loss of vision, speech difficulty, bruise/bleed easily  ALLERGIES: Allergies  Allergen Reactions  . Zocor [Simvastatin] Other (See Comments)  Fatigue and causes muscle aches   . Crestor [Rosuvastatin Calcium] Other (See Comments)    Muscle aches  . Lipitor [Atorvastatin] Diarrhea  . Statins Other (See Comments)    Fatigue and muscle aches    HOME MEDICATIONS: Outpatient Medications Prior to Visit  Medication Sig Dispense Refill  . Calcium Citrate (CITRACAL PO) Take 2 tablets daily by mouth.       . clopidogrel (PLAVIX) 75 MG tablet Take 1 tablet (75 mg total) by mouth daily. 30 tablet 0  . Cyanocobalamin (B-12 PO) Take 1 tablet by mouth daily.    . diclofenac sodium (VOLTAREN) 1 % GEL Apply 2 g topically 4 (four) times daily. (Patient taking differently: Apply 2 g 4 (four) times daily as needed topically (to painful sites). ) 1 Tube 1  . NON FORMULARY Kyolic Garlic extract capsules; Take 2 capsules by mouth two times a day    . NON FORMULARY Juice Plus Omega Oil Capsules: Take 2 capsules by mouth once a day    . sodium chloride (OCEAN) 0.65 % SOLN nasal spray Place 1 spray into both nostrils 2 (two) times daily. (Patient taking differently: Place 1 spray 2 (two) times daily as needed into both nostrils for congestion. )  0  . UNABLE TO FIND Juice Plus shakes: Drink 1 shake by mouth once a day     No facility-administered medications prior to visit.     PAST MEDICAL HISTORY: Past Medical History:  Diagnosis Date  . Arthritis   . Cataract   . GERD (gastroesophageal reflux disease)   . Glaucoma   . Stroke ([place])   . TIA (transient ischemic attack)     PAST SURGICAL HISTORY: Past Surgical History:  Procedure Laterality Date  . EYE SURGERY      FAMILY HISTORY: Family History  Problem Relation Age of Onset  . Dementia Mother   . Cancer Mother   . Stroke Mother   . Cancer Father     SOCIAL HISTORY: Social History   Socioeconomic History  . Marital status: Widowed    Spouse name: Not on file  . Number of children: 3  . Years of education: College  . Highest education level: Not on file  Social Needs  . Financial resource strain: Not on file  . Food insecurity - worry: Not on file  . Food insecurity - inability: Not on file  . Transportation needs - medical: Not on file  . Transportation needs - non-medical: Not on file  Occupational History  . Occupation: Retired  Tobacco Use  . Smoking status: Never Smoker  . Smokeless tobacco: Never Used  Substance and  Sexual Activity  . Alcohol use: No  . Drug use: No  . Sexual activity: Yes  Other Topics Concern  . Not on file  Social History Narrative   Husband passed away in 08/02/2013.   Now lives w/ her daughter.      PHYSICAL EXAM  Vitals:   03/25/17 0922  BP: (!) 156/79  Pulse: 75  Weight: 171 lb 6.4 oz (77.7 kg)   Body mass index is 30.36 kg/m.  Generalized: Well developed, in no acute distress   Neurological examination  Mentation: Alert oriented to time, place, history taking. Follows all commands speech and language fluent Cranial nerve II-XII: Pupils were equal round reactive to light. Extraocular movements were full, visual field were full on confrontational test. Facial sensation and strength were normal. Uvula tongue midline. Head turning and shoulder shrug  were normal  and symmetric. Motor: The motor testing reveals 5 over 5 strength of all 4 extremities. Good symmetric motor tone is noted throughout.  Sensory: Sensory testing is intact to soft touch on all 4 extremities. No evidence of extinction is noted.  Coordination: Cerebellar testing reveals good finger-nose-finger and heel-to-shin bilaterally.  Gait and station: Gait is normal.  Reflexes: Deep tendon reflexes are symmetric and normal bilaterally.   DIAGNOSTIC DATA (LABS, IMAGING, TESTING) - I reviewed patient records, labs, notes, testing and imaging myself where available.  Lab Results  Component Value Date   WBC 5.6 07/28/2016   HGB 13.0 07/28/2016   HCT 39.2 07/28/2016   MCV 88.5 07/28/2016   PLT 235 07/28/2016      Component Value Date/Time   NA 140 07/22/2016 0613   K 3.6 07/22/2016 0613   CL 103 07/22/2016 0613   CO2 29 07/22/2016 0613   GLUCOSE 102 (H) 07/22/2016 0613   BUN 14 07/22/2016 0613   CREATININE 0.70 07/22/2016 0613   CREATININE 0.58 08/16/2012 1703   CALCIUM 9.3 07/22/2016 0613   PROT 6.4 (L) 07/22/2016 0613   ALBUMIN 3.7 07/22/2016 0613   AST 21 07/22/2016 0613   ALT 18 07/22/2016  0613   ALKPHOS 73 07/22/2016 0613   BILITOT 1.0 07/22/2016 0613   GFRNONAA >60 07/22/2016 0613   GFRAA >60 07/22/2016 0613   Lab Results  Component Value Date   CHOL 193 07/20/2016   HDL 46 07/20/2016   LDLCALC 103 (H) 07/20/2016   TRIG 219 (H) 07/20/2016   CHOLHDL 4.2 07/20/2016   Lab Results  Component Value Date   HGBA1C 5.4 07/20/2016   Lab Results  Component Value Date   VITAMINB12 389 07/20/2016   Lab Results  Component Value Date   TSH 3.190 07/20/2016      ASSESSMENT AND PLAN 71 y.o. year old female  has a past medical history of Arthritis, Cataract, GERD (gastroesophageal reflux disease), Glaucoma, Stroke ([place]), and TIA (transient ischemic attack). here with :  1.  History of stroke  Overall the patient is doing well.  She will remain on Plavix.  I advised that she should monitor her blood pressure.  If it is remaining elevated she should make her primary care aware.  She should maintain strict control of her blood pressure with goal less than 130/90.  Cholesterol LDL less than 70 and hemoglobin A1c less than 6.5%.  She should continue to monitor her diet.  Advised that she can also discuss injectable cholesterol medication with her PCP.  She has any strokelike symptoms she should call 911.    Ward Givens, MSN, NP-C 03/25/2017, 9:42 AM [facility] [address], [place], [place] [address] [phone]

## 2017-03-25 NOTE — Patient Instructions (Addendum)
Your Plan:  Continue Plavix Blood Pressure <130/90 Cholesterol LDL <70 HgbA1c <6.5 % Monitor Diet Discuss injectable cholesterol medication with PCP If you have stroke like symptoms call 911 If your symptoms worsen or you develop new symptoms please let us know.   Thank you for coming to see [place] at [facility]. I hope we have been able to provide you high quality care today.  You may receive a patient satisfaction survey over the next few weeks. We would appreciate your feedback and comments so that we may continue to improve ourselves and the health of our patients.

## 2017-04-29 ENCOUNTER — Ambulatory Visit: Payer: Medicare Other | Admitting: Physical Medicine & Rehabilitation

## 2017-07-01 DIAGNOSIS — H401121 Primary open-angle glaucoma, left eye, mild stage: Secondary | ICD-10-CM | POA: Diagnosis not present

## 2017-07-01 DIAGNOSIS — H401112 Primary open-angle glaucoma, right eye, moderate stage: Secondary | ICD-10-CM | POA: Diagnosis not present

## 2017-07-01 DIAGNOSIS — Z961 Presence of intraocular lens: Secondary | ICD-10-CM | POA: Diagnosis not present

## 2017-11-30 DIAGNOSIS — L821 Other seborrheic keratosis: Secondary | ICD-10-CM | POA: Diagnosis not present

## 2017-11-30 DIAGNOSIS — L570 Actinic keratosis: Secondary | ICD-10-CM | POA: Diagnosis not present

## 2017-11-30 DIAGNOSIS — D485 Neoplasm of uncertain behavior of skin: Secondary | ICD-10-CM | POA: Diagnosis not present

## 2017-11-30 DIAGNOSIS — D225 Melanocytic nevi of trunk: Secondary | ICD-10-CM | POA: Diagnosis not present

## 2017-11-30 DIAGNOSIS — C44319 Basal cell carcinoma of skin of other parts of face: Secondary | ICD-10-CM | POA: Diagnosis not present

## 2017-11-30 DIAGNOSIS — Z85828 Personal history of other malignant neoplasm of skin: Secondary | ICD-10-CM | POA: Diagnosis not present

## 2017-11-30 DIAGNOSIS — D1801 Hemangioma of skin and subcutaneous tissue: Secondary | ICD-10-CM | POA: Diagnosis not present

## 2017-12-06 DIAGNOSIS — Z124 Encounter for screening for malignant neoplasm of cervix: Secondary | ICD-10-CM | POA: Diagnosis not present

## 2018-01-05 DIAGNOSIS — I10 Essential (primary) hypertension: Secondary | ICD-10-CM | POA: Diagnosis not present

## 2018-01-11 DIAGNOSIS — I10 Essential (primary) hypertension: Secondary | ICD-10-CM | POA: Diagnosis not present

## 2018-01-11 DIAGNOSIS — E559 Vitamin D deficiency, unspecified: Secondary | ICD-10-CM | POA: Diagnosis not present

## 2018-01-11 DIAGNOSIS — H409 Unspecified glaucoma: Secondary | ICD-10-CM | POA: Diagnosis not present

## 2018-01-11 DIAGNOSIS — M129 Arthropathy, unspecified: Secondary | ICD-10-CM | POA: Diagnosis not present

## 2018-01-11 DIAGNOSIS — R1310 Dysphagia, unspecified: Secondary | ICD-10-CM | POA: Diagnosis not present

## 2018-01-11 DIAGNOSIS — E785 Hyperlipidemia, unspecified: Secondary | ICD-10-CM | POA: Diagnosis not present

## 2018-01-11 DIAGNOSIS — E538 Deficiency of other specified B group vitamins: Secondary | ICD-10-CM | POA: Diagnosis not present

## 2018-01-11 DIAGNOSIS — R7301 Impaired fasting glucose: Secondary | ICD-10-CM | POA: Diagnosis not present

## 2018-01-13 DIAGNOSIS — H40013 Open angle with borderline findings, low risk, bilateral: Secondary | ICD-10-CM | POA: Diagnosis not present

## 2018-01-13 DIAGNOSIS — H04123 Dry eye syndrome of bilateral lacrimal glands: Secondary | ICD-10-CM | POA: Diagnosis not present

## 2018-01-13 DIAGNOSIS — Z961 Presence of intraocular lens: Secondary | ICD-10-CM | POA: Diagnosis not present

## 2018-01-27 DIAGNOSIS — I10 Essential (primary) hypertension: Secondary | ICD-10-CM | POA: Diagnosis not present

## 2018-02-11 DIAGNOSIS — Z23 Encounter for immunization: Secondary | ICD-10-CM | POA: Diagnosis not present

## 2018-02-23 DIAGNOSIS — C44319 Basal cell carcinoma of skin of other parts of face: Secondary | ICD-10-CM | POA: Diagnosis not present

## 2018-03-18 DIAGNOSIS — Z1231 Encounter for screening mammogram for malignant neoplasm of breast: Secondary | ICD-10-CM | POA: Diagnosis not present

## 2018-04-04 DIAGNOSIS — Z0000 Encounter for general adult medical examination without abnormal findings: Secondary | ICD-10-CM | POA: Diagnosis not present

## 2018-06-14 DIAGNOSIS — I10 Essential (primary) hypertension: Secondary | ICD-10-CM | POA: Diagnosis not present

## 2018-06-14 DIAGNOSIS — M899 Disorder of bone, unspecified: Secondary | ICD-10-CM | POA: Diagnosis not present

## 2018-06-14 DIAGNOSIS — E785 Hyperlipidemia, unspecified: Secondary | ICD-10-CM | POA: Diagnosis not present

## 2018-06-21 DIAGNOSIS — E559 Vitamin D deficiency, unspecified: Secondary | ICD-10-CM | POA: Diagnosis not present

## 2018-06-21 DIAGNOSIS — K5900 Constipation, unspecified: Secondary | ICD-10-CM | POA: Diagnosis not present

## 2018-06-21 DIAGNOSIS — H60502 Unspecified acute noninfective otitis externa, left ear: Secondary | ICD-10-CM | POA: Diagnosis not present

## 2018-06-21 DIAGNOSIS — R1310 Dysphagia, unspecified: Secondary | ICD-10-CM | POA: Diagnosis not present

## 2018-08-18 IMAGING — MR MR HEAD W/O CM
9 of 10 series · 34 of 48 positions shown · non-contrast
Comparison: Prior CTA from 07/19/2016.

CLINICAL DATA: Evaluation for slurred speech, right facial droop.

EXAM:
MRI HEAD WITHOUT CONTRAST
TECHNIQUE: Multiplanar, multiecho pulse sequences of the brain and surrounding
structures were obtained without intravenous contrast.

[Series 2: FLAIR · sagittal · 5.0mm · 0.47mm/px · 1 of 23 slices shown (1 of 2)]
[im 1/23]
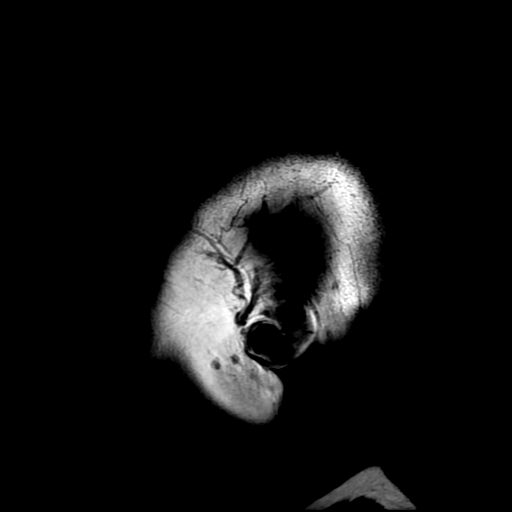

[Series 4: DWI · axial · 3.0mm · 0.94mm/px · z∈[-49,+92]mm · 8 of 96 slices shown (1 of 2)]
[im 1/96]
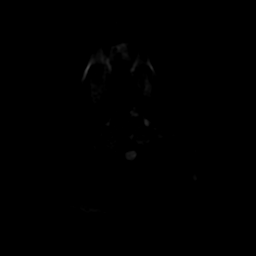
[im 11/96]
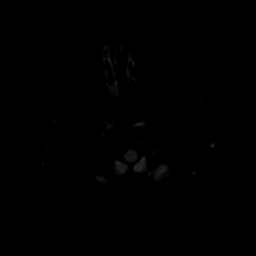
[im 32/96]
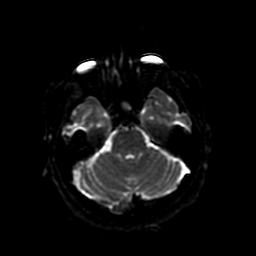
[im 43/96]
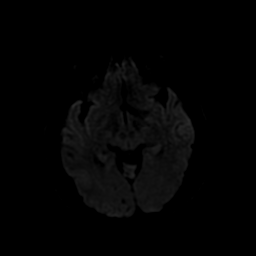
[im 53/96]
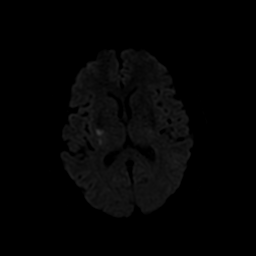
[im 64/96]
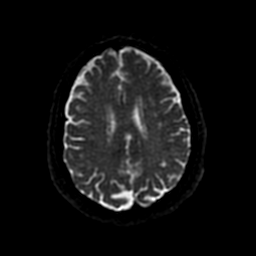
[im 85/96]
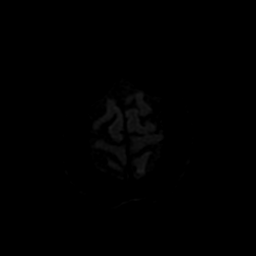
[im 96/96]
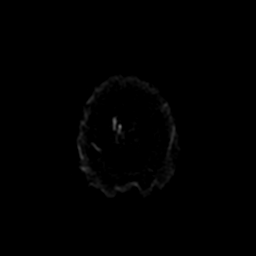

[Series 5: T2 · axial · 5.0mm · 0.47mm/px · z∈[-47,+90]mm · 2 of 24 slices shown (1 of 2)]
[im 1/24]
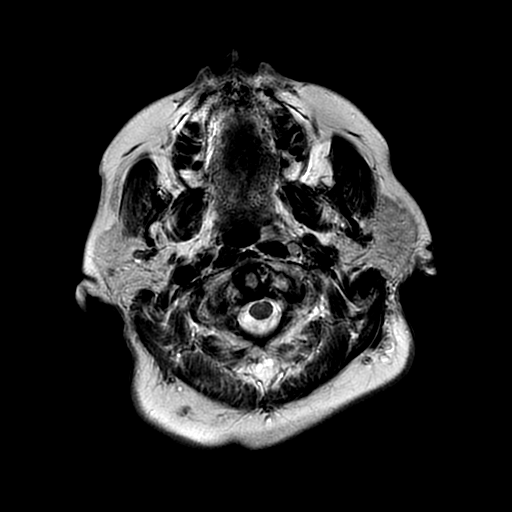
[im 24/24]
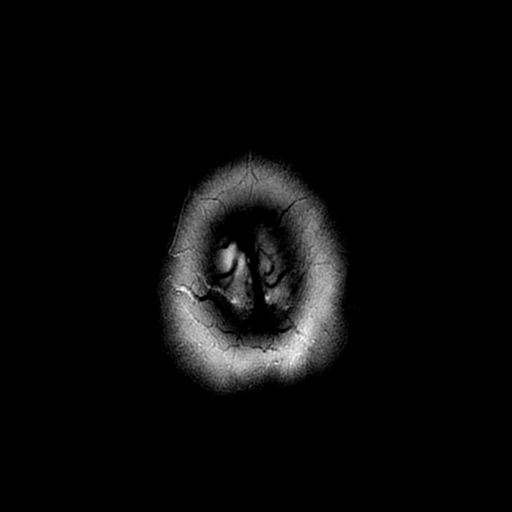

[Series 6: FLAIR · axial · 4.0mm · 0.41mm/px · z∈[-47,+90]mm · 3 of 26 slices shown (2 of 2)]
[im 1/26]
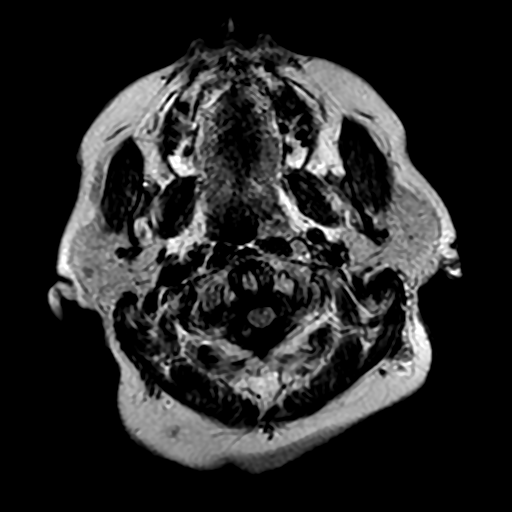
[im 13/26]
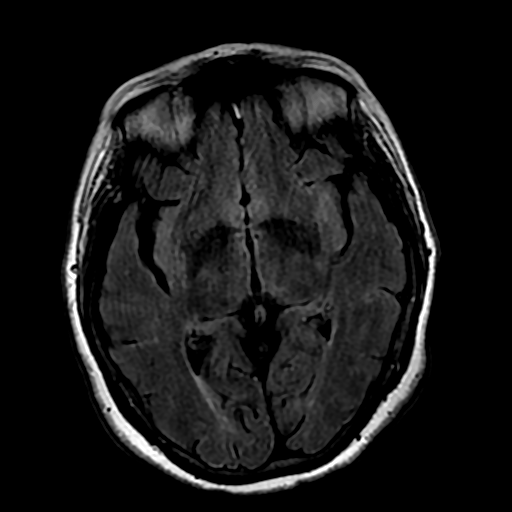
[im 26/26]
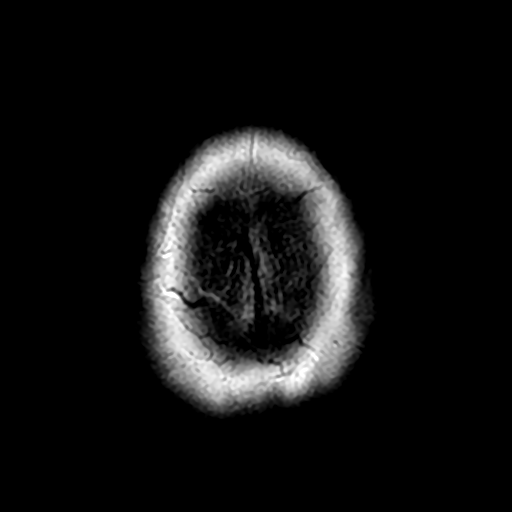

[Series 7: DWI · coronal · 4.0mm · 0.94mm/px · 6 of 64 slices shown (2 of 2)]
[im 1/64]
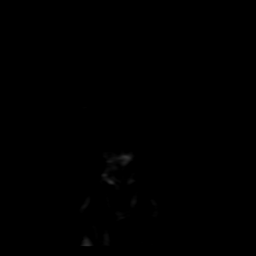
[im 13/64]
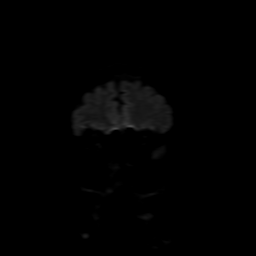
[im 26/64]
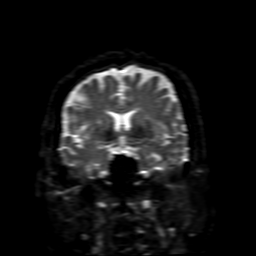
[im 38/64]
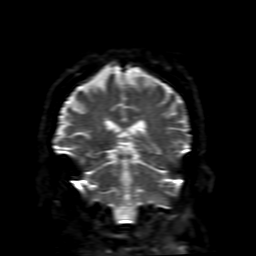
[im 51/64]
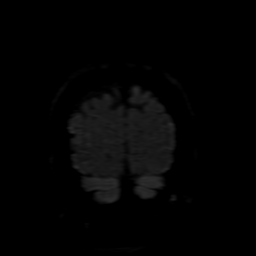
[im 64/64]
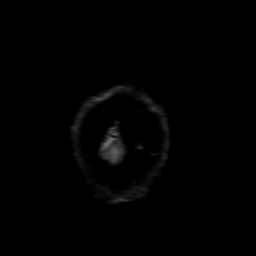

[Series 8: (person_name) · axial · 3.0mm · 0.47mm/px · z∈[-49,-2]mm · 3 of 96 slices shown]
[im 1/96]
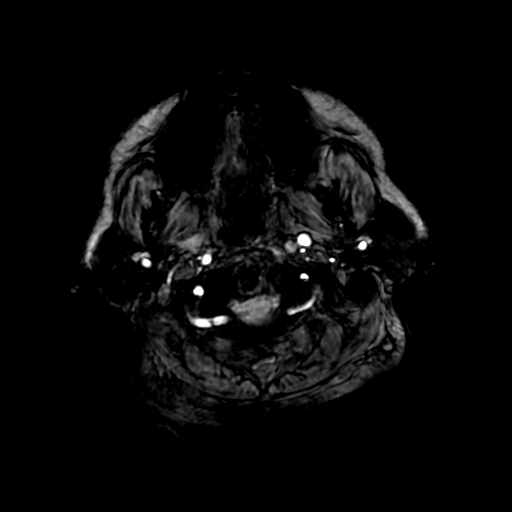
[im 11/96]
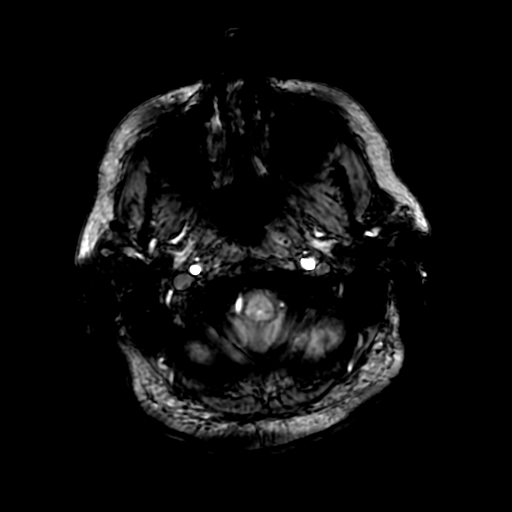
[im 32/96]
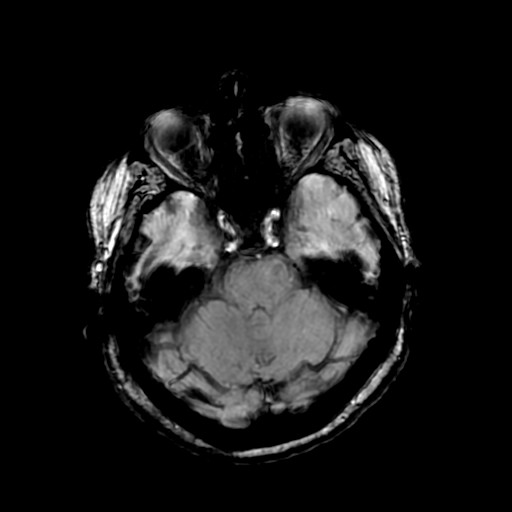

[Series 10: T2 · coronal · 5.0mm · 0.39mm/px · 3 of 27 slices shown (2 of 2)]
[im 1/27]
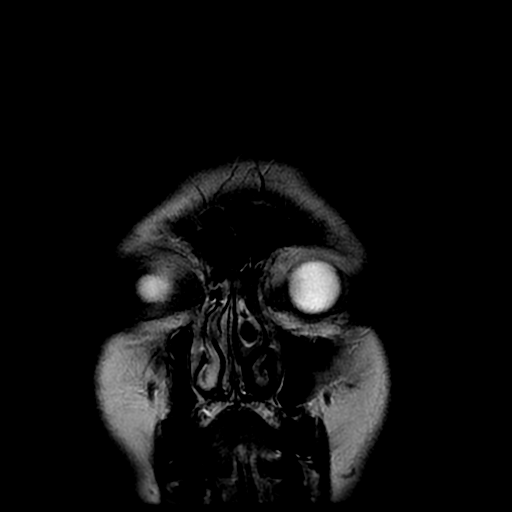
[im 14/27]
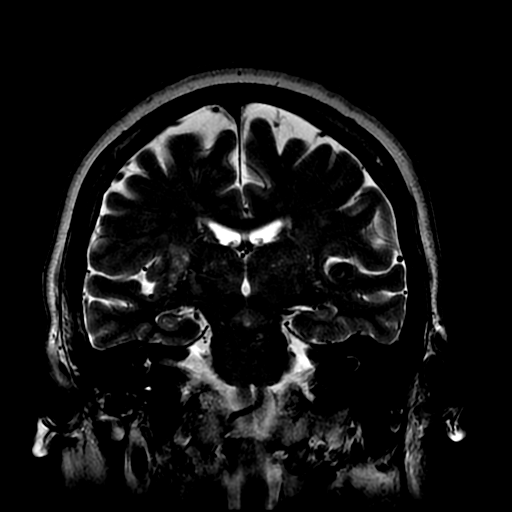
[im 27/27]
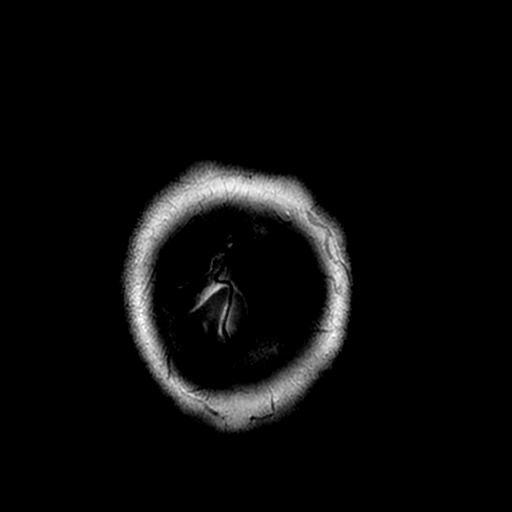

[Series 450: ADC · axial · 3.0mm · 0.94mm/px · z∈[-49,+92]mm · 5 of 47 slices shown (1 of 2)]
[im 1/47]
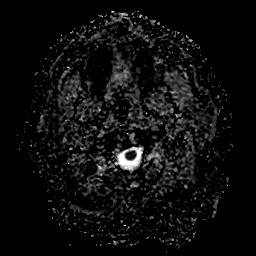
[im 12/47]
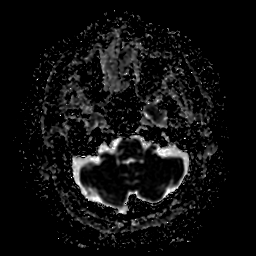
[im 24/47]
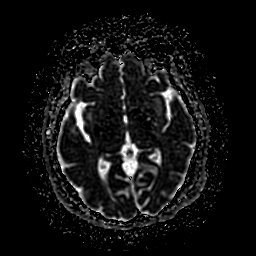
[im 35/47]
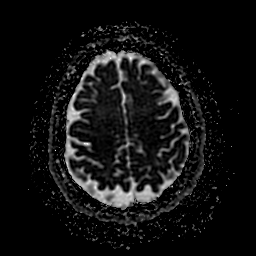
[im 47/47]
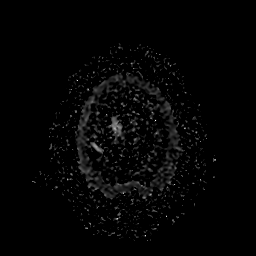

[Series 750: ADC · coronal · 4.0mm · 0.94mm/px · 3 of 32 slices shown (2 of 2)]
[im 1/32]
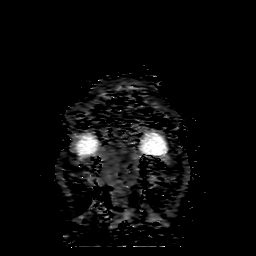
[im 16/32]
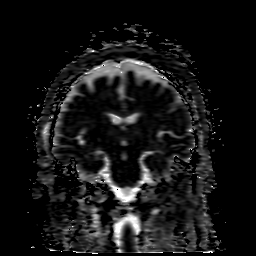
[im 32/32]
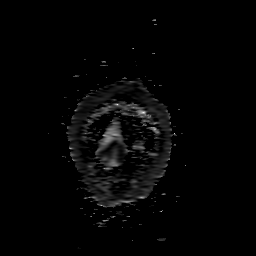

[34 of 48 positions shown; findings below may reference images not displayed]

FINDINGS: Brain: Diffuse prominence of the CSF containing spaces compatible
with generalized cerebral atrophy. Patchy and confluent T2/FLAIR
hyperintensity within the periventricular and deep white matter both
cerebral hemispheres most compatible chronic microvascular disease,
mild for age. Remote lacunar infarct present within the left
thalamus.

There is patchy abnormal restricted diffusion involving the
posterior right lentiform nucleus extending towards the
periventricular white matter of the right corona radiata (series 4,
image 29). No associated hemorrhage or mass effect.

No other evidence for acute or subacute ischemia. No other evidence
for acute or chronic intracranial hemorrhage. No other areas of
chronic infarction identified.

No mass lesion, midline shift or mass effect. Ventricles normal in
size without evidence for hydrocephalus. No extra-axial fluid
collection. Major dural sinuses are grossly patent.

Pituitary gland within normal limits.

Vascular: Major intracranial vascular flow voids are maintained.

Skull and upper cervical spine: Craniocervical junction within
normal limits. Visualized upper cervical spine unremarkable. Bone
marrow signal intensity within normal limits. No scalp soft tissue
abnormality.

Sinuses/Orbits: Globes and orbital soft tissues within normal
limits. Patient status post lens extraction bilaterally.

Scattered mucosal thickening within the ethmoidal air cells and
maxillary sinuses. Paranasal sinuses are otherwise clear. Small
bilateral mastoid effusions, right greater than left. Inner ear
structures normal.

Other: No other significant finding.
IMPRESSION: 1. Patchy small volume acute ischemic nonhemorrhagic right basal
ganglia infarct.
2. Small remote left thalamic lacunar infarct.
3. Generalized age-related cerebral atrophy with mild chronic
microvascular ischemic disease.

## 2018-08-22 DIAGNOSIS — R05 Cough: Secondary | ICD-10-CM | POA: Diagnosis not present

## 2018-09-27 ENCOUNTER — Emergency Department (HOSPITAL_COMMUNITY): Payer: Medicare Other

## 2018-09-27 ENCOUNTER — Encounter (HOSPITAL_COMMUNITY): Payer: Self-pay | Admitting: Family Medicine

## 2018-09-27 ENCOUNTER — Observation Stay (HOSPITAL_COMMUNITY)
Admission: EM | Admit: 2018-09-27 | Discharge: 2018-09-28 | Disposition: A | Discharge status: Home / Self Care | Payer: Medicare Other | Attending: Internal Medicine | Admitting: Internal Medicine

## 2018-09-27 ENCOUNTER — Other Ambulatory Visit: Payer: Self-pay

## 2018-09-27 DIAGNOSIS — K5900 Constipation, unspecified: Secondary | ICD-10-CM | POA: Diagnosis not present

## 2018-09-27 DIAGNOSIS — I10 Essential (primary) hypertension: Secondary | ICD-10-CM | POA: Insufficient documentation

## 2018-09-27 DIAGNOSIS — R0602 Shortness of breath: Secondary | ICD-10-CM | POA: Diagnosis not present

## 2018-09-27 DIAGNOSIS — Z20828 Contact with and (suspected) exposure to other viral communicable diseases: Secondary | ICD-10-CM | POA: Insufficient documentation

## 2018-09-27 DIAGNOSIS — R000 Tachycardia, unspecified: Secondary | ICD-10-CM | POA: Diagnosis not present

## 2018-09-27 DIAGNOSIS — R7989 Other specified abnormal findings of blood chemistry: Secondary | ICD-10-CM | POA: Insufficient documentation

## 2018-09-27 DIAGNOSIS — R079 Chest pain, unspecified: Secondary | ICD-10-CM | POA: Diagnosis not present

## 2018-09-27 DIAGNOSIS — R140 Abdominal distension (gaseous): Secondary | ICD-10-CM | POA: Diagnosis not present

## 2018-09-27 DIAGNOSIS — Z8673 Personal history of transient ischemic attack (TIA), and cerebral infarction without residual deficits: Secondary | ICD-10-CM

## 2018-09-27 DIAGNOSIS — Z79899 Other long term (current) drug therapy: Secondary | ICD-10-CM | POA: Diagnosis not present

## 2018-09-27 DIAGNOSIS — R1310 Dysphagia, unspecified: Secondary | ICD-10-CM | POA: Diagnosis not present

## 2018-09-27 DIAGNOSIS — I4891 Unspecified atrial fibrillation: Secondary | ICD-10-CM

## 2018-09-27 DIAGNOSIS — Z1211 Encounter for screening for malignant neoplasm of colon: Secondary | ICD-10-CM | POA: Diagnosis not present

## 2018-09-27 DIAGNOSIS — Z7901 Long term (current) use of anticoagulants: Secondary | ICD-10-CM | POA: Insufficient documentation

## 2018-09-27 DIAGNOSIS — I499 Cardiac arrhythmia, unspecified: Secondary | ICD-10-CM | POA: Diagnosis not present

## 2018-09-27 DIAGNOSIS — R0789 Other chest pain: Secondary | ICD-10-CM | POA: Diagnosis present

## 2018-09-27 LAB — CBC
HCT: 42.3 % (ref 36.0–46.0)
Hemoglobin: 13.9 g/dL (ref 12.0–15.0)
MCH: 30.2 pg (ref 26.0–34.0)
MCHC: 32.9 g/dL (ref 30.0–36.0)
MCV: 91.8 fL (ref 80.0–100.0)
Platelets: 343 10*3/uL (ref 150–400)
RBC: 4.61 MIL/uL (ref 3.87–5.11)
RDW: 13.7 % (ref 11.5–15.5)
WBC: 7.8 10*3/uL (ref 4.0–10.5)
nRBC: 0 % (ref 0.0–0.2)

## 2018-09-27 LAB — BASIC METABOLIC PANEL
Anion gap: 11 (ref 5–15)
BUN: 11 mg/dL (ref 8–23)
CO2: 22 mmol/L (ref 22–32)
Calcium: 9.2 mg/dL (ref 8.9–10.3)
Chloride: 109 mmol/L (ref 98–111)
Creatinine, Ser: 0.74 mg/dL (ref 0.44–1.00)
GFR calc Af Amer: >60 mL/min (ref >60)
GFR calc non Af Amer: >60 mL/min (ref >60)
Glucose, Bld: 130 mg/dL — ABNORMAL HIGH (ref 70–99)
Potassium: 4 mmol/L (ref 3.5–5.1)
Sodium: 142 mmol/L (ref 135–145)

## 2018-09-27 LAB — TSH: TSH: 1.735 u[IU]/mL (ref 0.350–4.500)

## 2018-09-27 LAB — I-STAT TROPONIN, ED: Troponin i, poc: 0.02 ng/mL (ref 0.00–0.08)

## 2018-09-27 LAB — TROPONIN I: Troponin I: 0.09 ng/mL (ref <0.03)

## 2018-09-27 LAB — MAGNESIUM: Magnesium: 2.2 mg/dL (ref 1.7–2.4)

## 2018-09-27 LAB — SARS CORONAVIRUS 2 BY RT PCR (HOSPITAL ORDER, PERFORMED IN ~~LOC~~ HOSPITAL LAB): SARS Coronavirus 2: NEGATIVE

## 2018-09-27 MED ORDER — NITROGLYCERIN 0.4 MG SL SUBL: 0.4000 mg | SUBLINGUAL_TABLET | SUBLINGUAL | Status: DC | PRN

## 2018-09-27 MED ORDER — CLOPIDOGREL BISULFATE 75 MG PO TABS
75.0000 mg | ORAL_TABLET | Freq: Every day | ORAL | Status: DC
  Administered 2018-09-28: 75 mg via ORAL
  Filled 2018-09-27: qty 1

## 2018-09-27 MED ORDER — ASPIRIN 81 MG PO CHEW
324.0000 mg | CHEWABLE_TABLET | ORAL | Status: AC
  Administered 2018-09-27: 324 mg via ORAL
  Filled 2018-09-27: qty 4

## 2018-09-27 MED ORDER — ACETAMINOPHEN 325 MG PO TABS: 650.0000 mg | ORAL_TABLET | ORAL | Status: DC | PRN

## 2018-09-27 MED ORDER — ASPIRIN 300 MG RE SUPP: 300.0000 mg | RECTAL | Status: AC

## 2018-09-27 MED ORDER — ENOXAPARIN SODIUM 40 MG/0.4ML ~~LOC~~ SOLN
40.0000 mg | SUBCUTANEOUS | Status: DC
  Administered 2018-09-27: 40 mg via SUBCUTANEOUS
  Filled 2018-09-27: qty 0.4

## 2018-09-27 MED ORDER — ONDANSETRON HCL 4 MG/2ML IJ SOLN: 4.0000 mg | Freq: Four times a day (QID) | INTRAMUSCULAR | Status: DC | PRN

## 2018-09-27 NOTE — ED Triage Notes (Signed)
Pt now back in NSR, EKG captured and given to EDP.

## 2018-09-27 NOTE — H&P (Signed)
History and Physical    Catherine [name] ZOX:[id number] DOB: 12/12/1945 DOA: 09/27/2018  PCP: Donald Prose, MD   Patient coming from: Home   Chief Complaint: Chest pain, SOB   HPI: Catherine [name] is a 73 y.o. female with medical history significant for history of CVA, now presenting to the emergency department for evaluation of chest pain and shortness of breath.  Patient reports that she was recently treated for bronchitis with an antibiotic and prednisone, completed these medications almost a week ago and is feeling better from that, was walking through her house this afternoon, and developed acute onset of an intense pressure sensation in the central chest that was accompanied by breathlessness, belching, and yawning.  She checked her vital signs at home and reports that her heart rate was rapid.  She was unable to identify any alleviating or exacerbating factors.  Denies any lower extremity swelling or tenderness.  She has never experienced this previously.  She has seasonal allergies and took a decongestant this morning that she has not used before.  EMS was called, patient was found to be tachycardic as high as 190, they thought that this might be atrial fibrillation but no rhythm strip or EKG is available, she was treated with adenosine and then metoprolol, and heart rate improved to 150s.  ED Course: Upon arrival to the ED, patient is found to be afebrile, saturating well on room air, tachycardic in the 140s, and with stable blood pressure.  EKG features sinus tachycardia with rate 149, PVCs, and LAD.  Chest x-ray is negative for acute cardiopulmonary disease.  Chemistry panel and CBC are unremarkable and troponin is normal.  Heart rate normalized in the emergency department, patient remains hemodynamically stable, and hospitalists are asked to admit for further evaluation and management.  Review of Systems:  All other systems reviewed and apart from HPI, are negative.  Past Medical History:   Diagnosis Date  . Arthritis   . Cataract   . GERD (gastroesophageal reflux disease)   . Glaucoma   . Stroke ([place])   . TIA (transient ischemic attack)     Past Surgical History:  Procedure Laterality Date  . EYE SURGERY       reports that she has never smoked. She has never used smokeless tobacco. She reports that she does not drink alcohol or use drugs.  Allergies  Allergen Reactions  . Zocor [Simvastatin] Other (See Comments)    Fatigue and causes muscle aches   . Crestor [Rosuvastatin Calcium] Other (See Comments)    Muscle aches  . Lipitor [Atorvastatin] Diarrhea  . Statins Other (See Comments)    Fatigue and muscle aches    Family History  Problem Relation Age of Onset  . Dementia Mother   . Cancer Mother   . Stroke Mother   . Cancer Father      Prior to Admission medications   Medication Sig Start Date End Date Taking? Authorizing Provider  Calcium Citrate (CITRACAL PO) Take 2 tablets daily by mouth.     [provider]  clopidogrel (PLAVIX) 75 MG tablet Take 1 tablet (75 mg total) by mouth daily. 07/29/16   Love, Ivan Anchors, PA-C  Cyanocobalamin (B-12 PO) Take 1 tablet by mouth daily.    [provider]  diclofenac sodium (VOLTAREN) 1 % GEL Apply 2 g topically 4 (four) times daily. Patient taking differently: Apply 2 g 4 (four) times daily as needed topically (to painful sites).  10/15/16   Jamse Arn,  MD  NON FORMULARY Kyolic Garlic extract capsules; Take 2 capsules by mouth two times a day    [provider]  NON FORMULARY Juice Plus Omega Oil Capsules: Take 2 capsules by mouth once a day    [provider]  sodium chloride (OCEAN) 0.65 % SOLN nasal spray Place 1 spray into both nostrils 2 (two) times daily. Patient taking differently: Place 1 spray 2 (two) times daily as needed into both nostrils for congestion.  07/29/16   Love, Ivan Anchors, PA-C  UNABLE TO FIND Juice Plus shakes: Drink 1 shake by mouth once a day     [provider]    Physical Exam: Vitals:   09/27/18 1629 09/27/18 1837 09/27/18 1845 09/27/18 1900  BP: 126/65 111/69 131/77 113/60  Pulse: 82 76 68 71  Resp: 16 20 15 19   Temp: 98.6 F (37 C)     TempSrc: Oral     SpO2: 96% 96% 97% 97%    Constitutional: NAD, calm  Eyes: PERTLA, lids and conjunctivae normal ENMT: Mucous membranes are moist. Posterior pharynx clear of any exudate or lesions.   Neck: normal, supple, no masses, no thyromegaly Respiratory: clear to auscultation bilaterally, no wheezing, no crackles. No accessory muscle use.  Cardiovascular: S1 & S2 heard, regular rate and rhythm. No extremity edema.   Abdomen: No distension, no tenderness, soft. Bowel sounds normal.  Musculoskeletal: no clubbing / cyanosis. No joint deformity upper and lower extremities.   Skin: no significant rashes, lesions, ulcers. Warm, dry, well-perfused. Neurologic: CN 2-12 grossly intact. Sensation intact. Strength 5/5 in all 4 limbs.  Psychiatric: Alert and oriented x 3. Pleasant, cooperative.    Labs on Admission: I have personally reviewed following labs and imaging studies  CBC: Recent Labs  Lab 09/27/18 1652  WBC 7.8  HGB 13.9  HCT 42.3  MCV 91.8  PLT 852   Basic Metabolic Panel: Recent Labs  Lab 09/27/18 1652  NA 142  K 4.0  CL 109  CO2 22  GLUCOSE 130*  BUN 11  CREATININE 0.74  CALCIUM 9.2   GFR: CrCl cannot be calculated (Unknown ideal weight.). Liver Function Tests: No results for input(s): AST, ALT, ALKPHOS, BILITOT, PROT, ALBUMIN in the last 168 hours. No results for input(s): LIPASE, AMYLASE in the last 168 hours. No results for input(s): AMMONIA in the last 168 hours. Coagulation Profile: No results for input(s): INR, PROTIME in the last 168 hours. Cardiac Enzymes: No results for input(s): CKTOTAL, CKMB, CKMBINDEX, TROPONINI in the last 168 hours. BNP (last 3 results) No results for input(s): PROBNP in the last 8760 hours. HbA1C: No results  for input(s): HGBA1C in the last 72 hours. CBG: No results for input(s): GLUCAP in the last 168 hours. Lipid Profile: No results for input(s): CHOL, HDL, LDLCALC, TRIG, CHOLHDL, LDLDIRECT in the last 72 hours. Thyroid Function Tests: No results for input(s): TSH, T4TOTAL, FREET4, T3FREE, THYROIDAB in the last 72 hours. Anemia Panel: No results for input(s): VITAMINB12, FOLATE, FERRITIN, TIBC, IRON, RETICCTPCT in the last 72 hours. Urine analysis:    Component Value Date/Time   COLORURINE YELLOW 12/14/2012 [place] 12/14/2012 1642   LABSPEC 1.007 12/14/2012 1642   PHURINE 8.0 12/14/2012 1642   GLUCOSEU NEGATIVE 12/14/2012 1642   HGBUR NEGATIVE 12/14/2012 1642   BILIRUBINUR NEGATIVE 12/14/2012 1642   BILIRUBINUR neg 08/16/2012 1659   KETONESUR NEGATIVE 12/14/2012 1642   PROTEINUR NEGATIVE 12/14/2012 1642   UROBILINOGEN 0.2 12/14/2012 1642   NITRITE NEGATIVE  12/14/2012 1642   LEUKOCYTESUR TRACE (A) 12/14/2012 1642   Sepsis Labs: @LABRCNTIP (procalcitonin:4,lacticidven:4) )No results found for this or any previous visit (from the past 240 hour(s)).   Radiological Exams on Admission: Dg Chest 2 View  Result Date: 09/27/2018 CLINICAL DATA:  Shortness of breath. EXAM: CHEST - 2 VIEW COMPARISON:  Radiographs of November 21, 2013. FINDINGS: The heart size and mediastinal contours are within normal limits. Both lungs are clear. Hypoinflation of the lungs is noted. No pneumothorax or pleural effusion is noted. The visualized skeletal structures are unremarkable. IMPRESSION: No active cardiopulmonary disease.  Hypoinflation of the lungs. Aortic Atherosclerosis (ICD10-I70.0). Electronically Signed   By: Marijo Conception M.D.   On: 09/27/2018 17:54    EKG: Independently reviewed. Sinus tachycardia (rate 149), PVC's, LAD.   Assessment/Plan   1. Chest pain; tachycardia  - Presents with chest pressure and tachycardia, received adenosine and IV metoprolol with EMS prior to arrival  but no rhythm strip available, found to be in sinus tachycardia with rate 149 on arrival, normalizing spontaneously  - There are no acute ischemic features on repeat EKG and ischemic etiology for this is less likely; she was taking a decongestant that may have precipitated the tachycardia; there is no evidence for DVT and no hypoxia or pleuritic pain to suggest PE  - Continue cardiac monitoring, obtain serial troponin measurements, check TSH, continue Plavix    2. History of CVA  - She reports residual sensory deficits but no new deficit  - She has not tolerated multiple statins  - Continue Plavix    PPE: Mask, face shield  DVT prophylaxis: Lovenox  Code Status: Full Family Communication: Discussed with patient  Consults called: None Admission status: Observation     Vianne Bulls, MD [facility] Pager [phone]  If 7PM-7AM, please contact night-coverage [email or web address] Password [facility]  09/27/2018, 7:21 PM

## 2018-09-27 NOTE — ED Triage Notes (Signed)
Pt endorses CP, shob and jaw pain since 1300. Pt is ambulatory but states "doesn't feel right with exertion" On plavix due to previous CVA. In a-fib hr 160-180. Given 6 adenosine and 5mg  metoprolol which brought HR down from 190 to 160.

## 2018-09-27 NOTE — ED Notes (Signed)
ED Provider at bedside. 

## 2018-09-27 NOTE — ED Notes (Signed)
Gilliam pts daughter

## 2018-09-27 NOTE — ED Provider Notes (Signed)
[facility] EMERGENCY DEPARTMENT Provider Note   CSN: [id number] Arrival date & time: 09/27/18  1614    History   Chief Complaint Chief Complaint  Patient presents with  . Chest Pain  . Shortness of Breath    HPI Catherine [name] is a 73 y.o. female.     73 year old female with history of prior CVA, on Plavix presents with complaint of chest discomfort, shortness of breath and nausea onset today around 1 PM.  Patient checked her vital signs at home and noted that her heart rate was very irregular which prompted her to call 911.  Per EMS report, on arrival patient was in A. fib with a heart rate of 190-160, given 6 mg of adenosine and 5 mg of metoprolol which improved her heart rate.  Patient states she has felt better since EMS treatment, no complaints since that time.  Denies previous cardiac problems.  Patient states that she was recently treated for bronchitis with an unknown 10-day antibiotic and prednisone and she wonders if the prednisone caused her event today.     Past Medical History:  Diagnosis Date  . Arthritis   . Cataract   . GERD (gastroesophageal reflux disease)   . Glaucoma   . Stroke ([place])   . TIA (transient ischemic attack)     Patient Active Problem List   Diagnosis Date Noted  . History of stroke 09/27/2018  . Chest pain 09/27/2018  . Tachycardia 09/27/2018  . Hemiparesis affecting left side as late effect of stroke ([place]) 08/07/2016  . Osteoarthritis of right knee 08/07/2016  . Dysphagia, post-stroke 08/07/2016  . Sleep disturbance 08/07/2016  . Rash and nonspecific skin eruption 08/07/2016  . Left hemiparesis ([place])   . GERD (gastroesophageal reflux disease) 07/23/2016  . Basal ganglia infarction ([place]) 07/21/2016  . Stroke (cerebrum) ([place]) 07/19/2016  . HLD (hyperlipidemia) 08/14/2014  . Obesity (BMI 30-39.9) 08/17/2013  . Hyperlipidemia LDL goal < 100 02/15/2013  . TIA (transient ischemic attack) 12/14/2012  . Essential  hypertension 12/14/2012  . Other and unspecified hyperlipidemia 12/14/2012    Past Surgical History:  Procedure Laterality Date  . EYE SURGERY       OB History   No obstetric history on file.      Home Medications    Prior to Admission medications   Medication Sig Start Date End Date Taking? Authorizing Provider  Calcium Citrate (CITRACAL PO) Take 2 tablets daily by mouth.     [provider]  clopidogrel (PLAVIX) 75 MG tablet Take 1 tablet (75 mg total) by mouth daily. 07/29/16   Love, Ivan Anchors, PA-C  Cyanocobalamin (B-12 PO) Take 1 tablet by mouth daily.    [provider]  diclofenac sodium (VOLTAREN) 1 % GEL Apply 2 g topically 4 (four) times daily. Patient taking differently: Apply 2 g 4 (four) times daily as needed topically (to painful sites).  10/15/16   Jamse Arn, MD  NON FORMULARY Kyolic Garlic extract capsules; Take 2 capsules by mouth two times a day    [provider]  NON FORMULARY Juice Plus Omega Oil Capsules: Take 2 capsules by mouth once a day    [provider]  sodium chloride (OCEAN) 0.65 % SOLN nasal spray Place 1 spray into both nostrils 2 (two) times daily. Patient taking differently: Place 1 spray 2 (two) times daily as needed into both nostrils for congestion.  07/29/16   Love, Ivan Anchors, PA-C  UNABLE TO FIND Juice Plus shakes:  Drink 1 shake by mouth once a day    [provider]    Family History Family History  Problem Relation Age of Onset  . Dementia Mother   . Cancer Mother   . Stroke Mother   . Cancer Father     Social History Social History   Tobacco Use  . Smoking status: Never Smoker  . Smokeless tobacco: Never Used  Substance Use Topics  . Alcohol use: No  . Drug use: No     Allergies   Zocor [simvastatin]; Crestor [rosuvastatin calcium]; Lipitor [atorvastatin]; and Statins   Review of Systems Review of Systems  Constitutional: Negative for chills, diaphoresis and fever.   Respiratory: Positive for shortness of breath.   Cardiovascular: Positive for chest pain and palpitations. Negative for leg swelling.  Gastrointestinal: Positive for nausea. Negative for constipation, diarrhea and vomiting.  Musculoskeletal: Negative for arthralgias and myalgias.  Skin: Negative for rash and wound.  Neurological: Negative for dizziness and weakness.  All other systems reviewed and are negative.    Physical Exam Updated Vital Signs BP 113/60   Pulse 71   Temp 98.6 F (37 C) (Oral)   Resp 19   SpO2 97%   Physical Exam Vitals signs and nursing note reviewed.  Constitutional:      General: She is not in acute distress.    Appearance: She is well-developed. She is not diaphoretic.  HENT:     Head: Normocephalic and atraumatic.  Cardiovascular:     Rate and Rhythm: Normal rate and regular rhythm.  Pulmonary:     Effort: Pulmonary effort is normal.     Breath sounds: Normal breath sounds.  Chest:     Chest wall: No tenderness.  Abdominal:     Tenderness: There is no abdominal tenderness.  Musculoskeletal:     Right lower leg: She exhibits no tenderness. No edema.     Left lower leg: She exhibits no tenderness. No edema.  Skin:    General: Skin is warm and dry.     Findings: No rash.  Neurological:     Mental Status: She is alert and oriented to person, place, and time.  Psychiatric:        Behavior: Behavior normal.      ED Treatments / Results  Labs (all labs ordered are listed, but only abnormal results are displayed) Labs Reviewed  BASIC METABOLIC PANEL - Abnormal; Notable for the following components:      Result Value   Glucose, Bld 130 (*)    All other components within normal limits  CBC  I-STAT TROPONIN, ED    EKG EKG Interpretation  Date/Time:  Tuesday September 27 2018 16:30:18 EDT Ventricular Rate:  80 PR Interval:  170 QRS Duration: 84 QT Interval:  364 QTC Calculation: 419 R Axis:   -17 Text Interpretation:  Normal sinus rhythm  Inferior infarct , age undetermined Anterior infarct , age undetermined Abnormal ECG Confirmed by Dene Gentry [phone]) on 09/27/2018 7:00:13 PM   Radiology Dg Chest 2 View  Result Date: 09/27/2018 CLINICAL DATA:  Shortness of breath. EXAM: CHEST - 2 VIEW COMPARISON:  Radiographs of November 21, 2013. FINDINGS: The heart size and mediastinal contours are within normal limits. Both lungs are clear. Hypoinflation of the lungs is noted. No pneumothorax or pleural effusion is noted. The visualized skeletal structures are unremarkable. IMPRESSION: No active cardiopulmonary disease.  Hypoinflation of the lungs. Aortic Atherosclerosis (ICD10-I70.0). Electronically Signed   By: Bobbe Medico.D.  On: 09/27/2018 17:54    Procedures Procedures (including critical care time)  Medications Ordered in ED Medications - No data to display   Initial Impression / Assessment and Plan / ED Course  I have reviewed the triage vital signs and the nursing notes.  Pertinent labs & imaging results that were available during my care of the patient were reviewed by me and considered in my medical decision making (see chart for details).  Clinical Course as of Sep 27 1922  Tue Sep 27, 7366  [id number] 73 year old female on Plavix after CVA 2 years ago presents with complaint of chest discomfort shortness of breath, nausea and jaw discomfort today around 1 PM.  Patient called 911, per EMS report was in A. fib with RVR, given adenosine and metoprolol and converted to normal sinus rhythm.  Patient states since that time she has felt well.  No history of prior SVT or A. fib events.  Repeat EKG in the ER with normal sinus rhythm.  Case discussed with Dr. Francia Greaves, ER attending, recommends consult to hospital service for admission.  Review of lab work shows CBC and BMP without significant changes, troponin negative at 0.02, chest x-ray with atherosclerosis. Case discussed with hospitalist who will consult for admission, patient is  aware of plan and agreeable.   [LM]    Clinical Course User Index [LM] Tacy Learn, PA-C      Final Clinical Impressions(s) / ED Diagnoses   Final diagnoses:  Atrial fibrillation with RVR [facility])  Chest pain, unspecified type    ED Discharge Orders    None       Roque Lias 09/27/18 1924    Valarie Merino, MD 09/28/18 0236

## 2018-09-27 NOTE — ED Notes (Signed)
ED TO INPATIENT HANDOFF REPORT  ED Nurse Name and Phone #:   Lenice Pressman [phone]  S Name/Age/Gender Catherine [name] 73 y.o. female Room/Bed: 035C/035C  Code Status   Code Status: Full Code  Home/SNF/Other Home Patient oriented to: self, place, time and situation Is this baseline? Yes   Triage Complete: Triage complete  Chief Complaint AFIB  Triage Note Pt endorses CP, shob and jaw pain since 1300. Pt is ambulatory but states "doesn't feel right with exertion" On plavix due to previous CVA. In a-fib hr 160-180. Given 6 adenosine and 5mg  metoprolol which brought HR down from 190 to 160.  Pt now back in NSR, EKG captured and given to EDP.    Allergies Allergies  Allergen Reactions  . Zocor [Simvastatin] Other (See Comments)    Fatigue and causes muscle aches   . Crestor [Rosuvastatin Calcium] Other (See Comments)    Muscle aches  . Lipitor [Atorvastatin] Diarrhea  . Statins Other (See Comments)    Fatigue and muscle aches    Level of Care/Admitting Diagnosis ED Disposition    ED Disposition Condition [facility]: [place] [[id number]]  Level of Care: Telemetry Cardiac [103]  I expect the patient will be discharged within 24 hours: Yes  LOW acuity---Tx typically complete <24 hrs---ACUTE conditions typically can be evaluated <24 hours---LABS likely to return to acceptable levels <24 hours---IS near functional baseline---EXPECTED to return to current living arrangement---NOT newly hypoxic: Meets criteria for 5C-Observation unit  Covid Evaluation: Screening Protocol (No Symptoms)  Diagnosis: Chest pain [563149]  Admitting Physician: Vianne Bulls [[id number]]  Attending Physician: Vianne Bulls [[id number]]  PT Class (Do Not Modify): Observation [104]  PT Acc Code (Do Not Modify): Observation [10022]       B Medical/Surgery History Past Medical History:  Diagnosis Date  . Arthritis   . Cataract   . GERD (gastroesophageal reflux  disease)   . Glaucoma   . Stroke ([place])   . TIA (transient ischemic attack)    Past Surgical History:  Procedure Laterality Date  . EYE SURGERY       A IV Location/Drains/Wounds Patient Lines/Drains/Airways Status   Active Line/Drains/Airways    Name:   Placement date:   Placement time:   Site:   Days:   Peripheral IV 09/27/18 Right Antecubital   09/27/18    -    Antecubital   less than 1          Intake/Output Last 24 hours No intake or output data in the 24 hours ending 09/27/18 2026  Labs/Imaging Results for orders placed or performed during the hospital encounter of 09/27/18 (from the past 48 hour(s))  I-Stat Troponin, ED (not at [facility])     Status: None   Collection Time: 09/27/18  4:51 PM  Result Value Ref Range   Troponin i, poc 0.02 0.00 - 0.08 ng/mL   Comment 3            Comment: Due to the release kinetics of cTnI, a negative result within the first hours of the onset of symptoms does not rule out myocardial infarction with certainty. If myocardial infarction is still suspected, repeat the test at appropriate intervals.   Basic metabolic panel     Status: Abnormal   Collection Time: 09/27/18  4:52 PM  Result Value Ref Range   Sodium 142 135 - 145 mmol/L   Potassium 4.0 3.5 - 5.1 mmol/L   Chloride 109 98 -  111 mmol/L   CO2 22 22 - 32 mmol/L   Glucose, Bld 130 (H) 70 - 99 mg/dL   BUN 11 8 - 23 mg/dL   Creatinine, Ser 0.74 0.44 - 1.00 mg/dL   Calcium 9.2 8.9 - 10.3 mg/dL   GFR calc non Af Amer >60 >60 mL/min   GFR calc Af Amer >60 >60 mL/min   Anion gap 11 5 - 15    Comment: Performed at [facility] [address] [place] [address]  CBC     Status: None   Collection Time: 09/27/18  4:52 PM  Result Value Ref Range   WBC 7.8 4.0 - 10.5 K/uL   RBC 4.61 3.87 - 5.11 MIL/uL   Hemoglobin 13.9 12.0 - 15.0 g/dL   HCT 42.3 36.0 - 46.0 %   MCV 91.8 80.0 - 100.0 fL   MCH 30.2 26.0 - 34.0 pg   MCHC 32.9 30.0 - 36.0 g/dL   RDW 13.7 11.5 - 15.5 %    Platelets 343 150 - 400 K/uL   nRBC 0.0 0.0 - 0.2 %    Comment: Performed at [facility], [place] [address], [place], [place] [address]   Dg Chest 2 View  Result Date: 09/27/2018 CLINICAL DATA:  Shortness of breath. EXAM: CHEST - 2 VIEW COMPARISON:  Radiographs of November 21, 2013. FINDINGS: The heart size and mediastinal contours are within normal limits. Both lungs are clear. Hypoinflation of the lungs is noted. No pneumothorax or pleural effusion is noted. The visualized skeletal structures are unremarkable. IMPRESSION: No active cardiopulmonary disease.  Hypoinflation of the lungs. Aortic Atherosclerosis (ICD10-I70.0). Electronically Signed   By: Marijo Conception M.D.   On: 09/27/2018 17:54    Pending Labs Unresulted Labs (From admission, onward)    Start     Ordered   10/04/18 0500  Creatinine, serum  (enoxaparin (LOVENOX)    CrCl >/= 30 ml/min)  Weekly,   R    Comments:  while on enoxaparin therapy    09/27/18 1926   09/28/18 0350  Basic metabolic panel  Tomorrow morning,   R     09/27/18 1926   09/28/18 0500  CBC  Tomorrow morning,   R     09/27/18 1926   09/27/18 1929  Magnesium  Once,   R     09/27/18 1928   09/27/18 1926  SARS Coronavirus 2 (CEPHEID - Performed in [place] hospital lab), Hosp Order  (Asymptomatic Patients Labs)  Once,   R    Question:  Rule Out  Answer:  Yes   09/27/18 1926   09/27/18 1925  Troponin I - Now Then Q6H  Now then every 6 hours,   STAT     09/27/18 1926   09/27/18 1925  TSH  Once,   R     09/27/18 1926          Vitals/Pain Today's Vitals   09/27/18 1900 09/27/18 1930 09/27/18 1945 09/27/18 2015  BP: 113/60 123/60 114/60 (!) 104/48  Pulse: 71 68 70 65  Resp: 19 19 17 19   Temp:      TempSrc:      SpO2: 97% 99% 100% 98%  PainSc:        Isolation Precautions No active isolations  Medications Medications  clopidogrel (PLAVIX) tablet 75 mg (has no administration in time range)  aspirin chewable tablet 324 mg (has no  administration in time range)    Or  aspirin  suppository 300 mg (has no administration in time range)  nitroGLYCERIN (NITROSTAT) SL tablet 0.4 mg (has no administration in time range)  acetaminophen (TYLENOL) tablet 650 mg (has no administration in time range)  ondansetron (ZOFRAN) injection 4 mg (has no administration in time range)  enoxaparin (LOVENOX) injection 40 mg (has no administration in time range)    Mobility walks     Focused Assessments Cardiac Assessment Handoff:  Cardiac Rhythm: Normal sinus rhythm Lab Results  Component Value Date   TROPONINI <0.30 12/14/2012   No results found for: DDIMER Does the Patient currently have chest pain? No     R Recommendations: See Admitting Provider Note  Report given to:   Additional Notes:

## 2018-09-28 DIAGNOSIS — R7989 Other specified abnormal findings of blood chemistry: Secondary | ICD-10-CM | POA: Diagnosis not present

## 2018-09-28 DIAGNOSIS — I471 Supraventricular tachycardia: Secondary | ICD-10-CM

## 2018-09-28 DIAGNOSIS — R000 Tachycardia, unspecified: Secondary | ICD-10-CM

## 2018-09-28 DIAGNOSIS — Z8673 Personal history of transient ischemic attack (TIA), and cerebral infarction without residual deficits: Secondary | ICD-10-CM | POA: Diagnosis not present

## 2018-09-28 DIAGNOSIS — I10 Essential (primary) hypertension: Secondary | ICD-10-CM | POA: Diagnosis not present

## 2018-09-28 DIAGNOSIS — Z20828 Contact with and (suspected) exposure to other viral communicable diseases: Secondary | ICD-10-CM | POA: Diagnosis not present

## 2018-09-28 LAB — BASIC METABOLIC PANEL
Anion gap: 9 (ref 5–15)
BUN: 10 mg/dL (ref 8–23)
CO2: 27 mmol/L (ref 22–32)
Calcium: 9.5 mg/dL (ref 8.9–10.3)
Chloride: 106 mmol/L (ref 98–111)
Creatinine, Ser: 0.86 mg/dL (ref 0.44–1.00)
GFR calc Af Amer: >60 mL/min (ref >60)
GFR calc non Af Amer: >60 mL/min (ref >60)
Glucose, Bld: 104 mg/dL — ABNORMAL HIGH (ref 70–99)
Potassium: 4.2 mmol/L (ref 3.5–5.1)
Sodium: 142 mmol/L (ref 135–145)

## 2018-09-28 LAB — CBC
HCT: 37 % (ref 36.0–46.0)
Hemoglobin: 12.2 g/dL (ref 12.0–15.0)
MCH: 29.9 pg (ref 26.0–34.0)
MCHC: 33 g/dL (ref 30.0–36.0)
MCV: 90.7 fL (ref 80.0–100.0)
Platelets: 257 10*3/uL (ref 150–400)
RBC: 4.08 MIL/uL (ref 3.87–5.11)
RDW: 13.9 % (ref 11.5–15.5)
WBC: 6.1 10*3/uL (ref 4.0–10.5)
nRBC: 0 % (ref 0.0–0.2)

## 2018-09-28 LAB — TROPONIN I
Troponin I: 0.09 ng/mL (ref <0.03)
Troponin I: 0.06 ng/mL (ref <0.03)

## 2018-09-28 MED ORDER — LATANOPROST 0.005 % OP SOLN
1.0000 [drp] | Freq: Every day | OPHTHALMIC | Status: DC
  Filled 2018-09-28: qty 2.5

## 2018-09-28 NOTE — Plan of Care (Signed)
  Problem: Education: Goal: Knowledge of General Education information will improve Description: Including pain rating scale, medication(s)/side effects and non-pharmacologic comfort measures Outcome: Completed/Met   Problem: Health Behavior/Discharge Planning: Goal: Ability to manage health-related needs will improve Outcome: Completed/Met   Problem: Clinical Measurements: Goal: Ability to maintain clinical measurements within normal limits will improve Outcome: Completed/Met Goal: Will remain free from infection Outcome: Completed/Met Goal: Diagnostic test results will improve Outcome: Completed/Met Goal: Respiratory complications will improve Outcome: Completed/Met Goal: Cardiovascular complication will be avoided Outcome: Completed/Met   Problem: Activity: Goal: Risk for activity intolerance will decrease Outcome: Completed/Met   Problem: Nutrition: Goal: Adequate nutrition will be maintained Outcome: Completed/Met   Problem: Coping: Goal: Level of anxiety will decrease Outcome: Completed/Met   Problem: Elimination: Goal: Will not experience complications related to bowel motility Outcome: Completed/Met Goal: Will not experience complications related to urinary retention Outcome: Completed/Met   Problem: Pain Managment: Goal: General experience of comfort will improve Outcome: Completed/Met   Problem: Safety: Goal: Ability to remain free from injury will improve Outcome: Completed/Met   Problem: Skin Integrity: Goal: Risk for impaired skin integrity will decrease Outcome: Completed/Met   Problem: Education: Goal: Understanding of cardiac disease, CV risk reduction, and recovery process will improve Outcome: Completed/Met Goal: Individualized Educational Video(s) Outcome: Completed/Met   Problem: Activity: Goal: Ability to tolerate increased activity will improve Outcome: Completed/Met   Problem: Cardiac: Goal: Ability to achieve and maintain adequate  cardiovascular perfusion will improve Outcome: Completed/Met   Problem: Health Behavior/Discharge Planning: Goal: Ability to safely manage health-related needs after discharge will improve Outcome: Completed/Met   

## 2018-09-28 NOTE — Progress Notes (Signed)
CRITICAL VALUE ALERT  Critical Value:  Troponin 0.09  Date & Time Notied:  09/28/2018 2200  Provider Notified: Baltazar Najjar  Orders Received/Actions taken: no new orders

## 2018-09-28 NOTE — Discharge Summary (Addendum)
Physician Discharge Summary  Catherine [name]:[id number] DOB: 1946/01/03 DOA: 09/27/2018  PCP: Donald Prose, MD  Admit date: 09/27/2018 Discharge date: 09/28/2018  Admitted From: home Discharge disposition: home   Recommendations for Outpatient Follow-Up:   1. Referral to cardiology-- appears to have episode of SVT-- given adenosine with resolution-- ? Need for holter monitor/echo   Discharge Diagnosis:   Principal Problem:   Chest pain Active Problems:   History of stroke   Tachycardia    Discharge Condition: Improved.  Diet recommendation: Low sodium, heart healthy  Wound care: None.  Code status: Full.   History of Present Illness:   Catherine [name] is a 73 y.o. female with medical history significant for history of CVA, now presenting to the emergency department for evaluation of chest pain and shortness of breath.  Patient reports that she was recently treated for bronchitis with an antibiotic and prednisone, completed these medications almost a week ago and is feeling better from that, was walking through her house this afternoon, and developed acute onset of an intense pressure sensation in the central chest that was accompanied by breathlessness, belching, and yawning.  She checked her vital signs at home and reports that her heart rate was rapid.  She was unable to identify any alleviating or exacerbating factors.  Denies any lower extremity swelling or tenderness.  She has never experienced this previously.  She has seasonal allergies and took a decongestant this morning that she has not used before.  EMS was called, patient was found to be tachycardic as high as 190, they thought that this might be atrial fibrillation but no rhythm strip or EKG is available, she was treated with adenosine and then metoprolol, and heart rate improved to 150s.   Hospital Course by Problem:   Tachycardia/SVT? (new episode-- never had prior) - Presents with chest pressure and  tachycardia, received adenosine with EMS prior to arrival but no rhythm strip available in Epic nor on chart, found to be in sinus tachycardia with rate 149 on arrival, normalizing spontaneously s/p adenosine - There are no acute ischemic features on repeat EKG and ischemic etiology for this is less likely; she was taking a decongestant that may have precipitated the tachycardia; there is no evidence for DVT and no hypoxia or pleuritic pain to suggest PE  -no further episode on telemetry - TSH normal -referral made to cardiology for further outpatient work up-- there was ? Of atrial fibrillation but no EKG or rhythm strip to be found to confirm  Elevated troponin secondary to tachycardia -trending down -outpatient referral to cardiology  History of CVA (per chart small vessel) - She reports residual sensory deficits but no new deficit  - She has not tolerated multiple statins  - Continue Plavix     Medical Consultants:      Discharge Exam:   Vitals:   09/27/18 2045 09/28/18 0426  BP: 122/70 (!) 122/58  Pulse: 63 60  Resp:    Temp: (!) 97.5 F (36.4 C) 98 F (36.7 C)  SpO2: 100% 95%   Vitals:   09/27/18 1945 09/27/18 2015 09/27/18 2045 09/28/18 0426  BP: 114/60 (!) 104/48 122/70 (!) 122/58  Pulse: 70 65 63 60  Resp: 17 19    Temp:   (!) 97.5 F (36.4 C) 98 F (36.7 C)  TempSrc:   Oral Oral  SpO2: 100% 98% 100% 95%  Weight:   78 kg 77.9 kg  Height:   5\' 3"  (  1.6 m)     General exam: Appears calm and comfortable.   The results of significant diagnostics from this hospitalization (including imaging, microbiology, ancillary and laboratory) are listed below for reference.     Procedures and Diagnostic Studies:   Dg Chest 2 View  Result Date: 09/27/2018 CLINICAL DATA:  Shortness of breath. EXAM: CHEST - 2 VIEW COMPARISON:  Radiographs of November 21, 2013. FINDINGS: The heart size and mediastinal contours are within normal limits. Both lungs are clear. Hypoinflation of  the lungs is noted. No pneumothorax or pleural effusion is noted. The visualized skeletal structures are unremarkable. IMPRESSION: No active cardiopulmonary disease.  Hypoinflation of the lungs. Aortic Atherosclerosis (ICD10-I70.0). Electronically Signed   By: Marijo Conception M.D.   On: 09/27/2018 17:54     Labs:   Basic Metabolic Panel: Recent Labs  Lab 09/27/18 1652 09/27/18 2055 09/28/18 0710  NA 142  --  142  K 4.0  --  4.2  CL 109  --  106  CO2 22  --  27  GLUCOSE 130*  --  104*  BUN 11  --  10  CREATININE 0.74  --  0.86  CALCIUM 9.2  --  9.5  MG  --  2.2  --    GFR Estimated Creatinine Clearance: 56.7 mL/min (by C-G formula based on SCr of 0.86 mg/dL). Liver Function Tests: No results for input(s): AST, ALT, ALKPHOS, BILITOT, PROT, ALBUMIN in the last 168 hours. No results for input(s): LIPASE, AMYLASE in the last 168 hours. No results for input(s): AMMONIA in the last 168 hours. Coagulation profile No results for input(s): INR, PROTIME in the last 168 hours.  CBC: Recent Labs  Lab 09/27/18 1652 09/28/18 0710  WBC 7.8 6.1  HGB 13.9 12.2  HCT 42.3 37.0  MCV 91.8 90.7  PLT 343 257   Cardiac Enzymes: Recent Labs  Lab 09/27/18 2055 09/28/18 0054 09/28/18 0710  TROPONINI 0.09* 0.09* 0.06*   BNP: Invalid input(s): POCBNP CBG: No results for input(s): GLUCAP in the last 168 hours. D-Dimer No results for input(s): DDIMER in the last 72 hours. Hgb A1c No results for input(s): HGBA1C in the last 72 hours. Lipid Profile No results for input(s): CHOL, HDL, LDLCALC, TRIG, CHOLHDL, LDLDIRECT in the last 72 hours. Thyroid function studies Recent Labs    09/27/18 2055  TSH 1.735   Anemia work up No results for input(s): VITAMINB12, FOLATE, FERRITIN, TIBC, IRON, RETICCTPCT in the last 72 hours. Microbiology Recent Results (from the past 240 hour(s))  SARS Coronavirus 2 (CEPHEID - Performed in [facility] hospital lab), Hosp Order     Status: None    Collection Time: 09/27/18  7:49 PM  Result Value Ref Range Status   SARS Coronavirus 2 NEGATIVE NEGATIVE Final    Comment: (NOTE) If result is NEGATIVE SARS-CoV-2 target nucleic acids are NOT DETECTED. The SARS-CoV-2 RNA is generally detectable in upper and lower  respiratory specimens during the acute phase of infection. The lowest  concentration of SARS-CoV-2 viral copies this assay can detect is 250  copies / mL. A negative result does not preclude SARS-CoV-2 infection  and should not be used as the sole basis for treatment or other  patient management decisions.  A negative result may occur with  improper specimen collection / handling, submission of specimen other  than nasopharyngeal swab, presence of viral mutation(s) within the  areas targeted by this assay, and inadequate number of viral copies  (<250 copies / mL). A  negative result must be combined with clinical  observations, patient history, and epidemiological information. If result is POSITIVE SARS-CoV-2 target nucleic acids are DETECTED. The SARS-CoV-2 RNA is generally detectable in upper and lower  respiratory specimens dur ing the acute phase of infection.  Positive  results are indicative of active infection with SARS-CoV-2.  Clinical  correlation with patient history and other diagnostic information is  necessary to determine patient infection status.  Positive results do  not rule out bacterial infection or co-infection with other viruses. If result is PRESUMPTIVE POSTIVE SARS-CoV-2 nucleic acids MAY BE PRESENT.   A presumptive positive result was obtained on the submitted specimen  and confirmed on repeat testing.  While 2019 novel coronavirus  (SARS-CoV-2) nucleic acids may be present in the submitted sample  additional confirmatory testing may be necessary for epidemiological  and / or clinical management purposes  to differentiate between  SARS-CoV-2 and other Sarbecovirus currently known to infect humans.   If clinically indicated additional testing with an alternate test  methodology [phone]) is advised. The SARS-CoV-2 RNA is generally  detectable in upper and lower respiratory sp ecimens during the acute  phase of infection. The expected result is Negative. Fact Sheet for Patients:  StrictlyIdeas.no Fact Sheet for Healthcare Providers: BankingDealers.co.za This test is not yet approved or cleared by the [place] FDA and has been authorized for detection and/or diagnosis of SARS-CoV-2 by FDA under an Emergency Use Authorization (EUA).  This EUA will remain in effect (meaning this test can be used) for the duration of the COVID-19 declaration under Section 564(b)(1) of the Act, 21 U.S.C. section 360bbb-3(b)(1), unless the authorization is terminated or revoked sooner. Performed at [facility], [place] [address] [place], [place] [address]      Discharge Instructions:   Discharge Instructions    Ambulatory referral to Cardiology   Complete by:  As directed    Follow up episode of SVT   Diet - low sodium heart healthy   Complete by:  As directed    Increase activity slowly   Complete by:  As directed      Allergies as of 09/28/2018      Reactions   Zocor [simvastatin] Other (See Comments)   Myalgias Pt cannot tolerate more than 20mg    Crestor [rosuvastatin Calcium] Other (See Comments)   Myalgias   Lipitor [atorvastatin] Other (See Comments)   Myalgias   Statins Other (See Comments)   Myalgias      Medication List    TAKE these medications   albuterol 108 (90 Base) MCG/ACT inhaler Commonly known as:  VENTOLIN HFA Inhale 1-2 puffs into the lungs every 4 (four) hours as needed for shortness of breath or wheezing.   B-12 PO Take 1 tablet by mouth daily.   CITRACAL PO Take 2 tablets daily by mouth.   clopidogrel 75 MG tablet Commonly known as:  PLAVIX Take 1 tablet (75 mg total) by mouth daily.   diclofenac  sodium 1 % Gel Commonly known as:  Voltaren Apply 2 g topically 4 (four) times daily. What changed:    when to take this  reasons to take this   latanoprost 0.005 % ophthalmic solution Commonly known as:  XALATAN Place 1 drop into both eyes at bedtime.   lisinopril 10 MG tablet Commonly known as:  ZESTRIL Take 10 mg by mouth daily.   NON FORMULARY Kyolic Garlic extract capsules; Take 2 capsules by mouth two times a day   NON FORMULARY  Juice Plus Omega Oil Capsules: Take 2 capsules by mouth once a day   simvastatin 20 MG tablet Commonly known as:  ZOCOR Take 20 mg by mouth at bedtime.   sodium chloride 0.65 % Soln nasal spray Commonly known as:  OCEAN Place 1 spray into both nostrils 2 (two) times daily. What changed:    when to take this  reasons to take this   UNABLE TO FIND Juice Plus shakes: Drink 1 shake by mouth once a day      Follow-up Information    Donald Prose, MD Follow up in 1 week(s).   Specialty:  Family Medicine Contact information: [place] [address] [phone]            Time coordinating discharge: 25 min  Signed:  Geradine Girt DO  [facility] 09/28/2018, 11:12 AM

## 2018-09-30 DIAGNOSIS — T50995A Adverse effect of other drugs, medicaments and biological substances, initial encounter: Secondary | ICD-10-CM | POA: Diagnosis not present

## 2018-09-30 DIAGNOSIS — I471 Supraventricular tachycardia: Secondary | ICD-10-CM | POA: Diagnosis not present

## 2018-10-10 ENCOUNTER — Encounter: Payer: Self-pay | Admitting: Cardiology

## 2018-10-10 ENCOUNTER — Ambulatory Visit (INDEPENDENT_AMBULATORY_CARE_PROVIDER_SITE_OTHER): Payer: Medicare Other | Admitting: Cardiology

## 2018-10-10 ENCOUNTER — Other Ambulatory Visit: Payer: Self-pay

## 2018-10-10 VITALS — BP 175/78 | HR 77 | Temp 97.8°F | Ht 63.0 in | Wt 174.0 lb

## 2018-10-10 DIAGNOSIS — I208 Other forms of angina pectoris: Secondary | ICD-10-CM | POA: Diagnosis not present

## 2018-10-10 DIAGNOSIS — I10 Essential (primary) hypertension: Secondary | ICD-10-CM | POA: Diagnosis not present

## 2018-10-10 DIAGNOSIS — Z8673 Personal history of transient ischemic attack (TIA), and cerebral infarction without residual deficits: Secondary | ICD-10-CM | POA: Diagnosis not present

## 2018-10-10 DIAGNOSIS — R9431 Abnormal electrocardiogram [ECG] [EKG]: Secondary | ICD-10-CM | POA: Diagnosis not present

## 2018-10-10 DIAGNOSIS — E782 Mixed hyperlipidemia: Secondary | ICD-10-CM

## 2018-10-10 DIAGNOSIS — R079 Chest pain, unspecified: Secondary | ICD-10-CM

## 2018-10-10 DIAGNOSIS — R0789 Other chest pain: Secondary | ICD-10-CM

## 2018-10-10 DIAGNOSIS — I639 Cerebral infarction, unspecified: Secondary | ICD-10-CM

## 2018-10-10 DIAGNOSIS — I480 Paroxysmal atrial fibrillation: Secondary | ICD-10-CM

## 2018-10-10 MED ORDER — APIXABAN 5 MG PO TABS: 5.0000 mg | ORAL_TABLET | Freq: Two times a day (BID) | ORAL | 3 refills | Status: DC

## 2018-10-10 MED ORDER — EZETIMIBE 10 MG PO TABS: 10.0000 mg | ORAL_TABLET | Freq: Every day | ORAL | 3 refills | Status: DC

## 2018-10-10 NOTE — Progress Notes (Signed)
Primary Physician/Referring:  Ferd Hibbs, NP  [name], female    DOB: 10/20/1945, 73 y.o.   MRN: [id number]  Chief Complaint  Patient presents with  . Hypertension  . Tachycardia  . New Patient (Initial Visit)  . Atrial Fibrillation   HPI: Catherine [name]  is a 73 y.o. female  with Patient admitted on 09/27/2018 with chest heaviness and tachycardia, was recently treated with antibiotics and steroids for acute exacerbation of bronchitis and called the EMS on 09/28/2018, heart rate to be 180 bpm by EMS, received adenosine with decreasing heart rate 150 bpm, arriving emergency room with chest  pressure at home with the recommendation for outpatient follow-up.  She was also found to have mildly elevated serum troponin prior to be rate related.  Patient has history of prior stroke with residual left sided weakness and smell and taste being altered, hypertension, hyperlipidemia and has history of statin intolerance with inability to take greater than 20 mg of simvastatin.  She has not had any further chest pressure. No new complaints.   Past Medical History:  Diagnosis Date  . Arthritis   . Cataract   . GERD (gastroesophageal reflux disease)   . Glaucoma   . Hyperlipidemia   . Hypertension   . Stroke ([place])   . TIA (transient ischemic attack)     Past Surgical History:  Procedure Laterality Date  . EYE SURGERY      Social History   Socioeconomic History  . Marital status: Widowed    Spouse name: Not on file  . Number of children: 3  . Years of education: College  . Highest education level: Not on file  Occupational History  . Occupation: Retired  Scientific laboratory technician  . Financial resource strain: Not on file  . Food insecurity    Worry: Not on file    Inability: Not on file  . Transportation needs    Medical: Not on file    Non-medical: Not on file  Tobacco Use  . Smoking status: Never Smoker  . Smokeless tobacco: Never Used  Substance and Sexual  Activity  . Alcohol use: No  . Drug use: No  . Sexual activity: Yes  Lifestyle  . Physical activity    Days per week: Not on file    Minutes per session: Not on file  . Stress: Not on file  Relationships  . Social Herbalist on phone: Not on file    Gets together: Not on file    Attends religious service: Not on file    Active member of club or organization: Not on file    Attends meetings of clubs or organizations: Not on file    Relationship status: Not on file  . Intimate partner violence    Fear of current or ex partner: Not on file    Emotionally abused: Not on file    Physically abused: Not on file    Forced sexual activity: Not on file  Other Topics Concern  . Not on file  Social History Narrative   Husband passed away in 2013/06/25.   Now lives w/ her daughter.    Review of Systems  Constitution: Negative for chills, decreased appetite, malaise/fatigue and weight gain.  Cardiovascular: Negative for dyspnea on exertion, leg swelling and syncope.  Endocrine: Negative for cold intolerance.  Hematologic/Lymphatic: Does not bruise/bleed easily.  Musculoskeletal: Positive for back pain and joint pain. Negative for joint swelling.  Gastrointestinal: Negative for abdominal  pain, anorexia, change in bowel habit, hematochezia and melena.  Neurological: Positive for focal weakness (left sided). Negative for headaches and light-headedness.  Psychiatric/Behavioral: Negative for depression and substance abuse.  All other systems reviewed and are negative.     Objective  Blood pressure (!) 175/78, pulse 77, temperature 97.8 F (36.6 C), height 5' 3"  (1.6 m), weight 174 lb (78.9 kg), SpO2 98 %. Body mass index is 30.82 kg/m.    Physical Exam  Constitutional: No distress.  Short stature, mildly obese  HENT:  Head: Atraumatic.  Eyes: Conjunctivae are normal.  Neck: Neck supple. No JVD present. No thyromegaly present.  Cardiovascular: Normal rate, regular rhythm, S1  normal, S2 normal and intact distal pulses. Exam reveals no gallop.  Murmur heard.  Scratchy early systolic murmur is present with a grade of 2/6 at the upper right sternal border. Pulmonary/Chest: Effort normal and breath sounds normal.  Abdominal: Soft. Bowel sounds are normal.  Musculoskeletal: Normal range of motion.  Neurological: She is alert.  Skin: Skin is warm and dry.  Psychiatric: She has a normal mood and affect.   Radiology: No results found.  Laboratory examination:   CMP Latest Ref Rng & Units 09/28/2018 09/27/2018 07/22/2016  Glucose 70 - 99 mg/dL 104(H) 130(H) 102(H)  BUN 8 - 23 mg/dL 10 11 14   Creatinine 0.44 - 1.00 mg/dL 0.86 0.74 0.70  Sodium 135 - 145 mmol/L 142 142 140  Potassium 3.5 - 5.1 mmol/L 4.2 4.0 3.6  Chloride 98 - 111 mmol/L 106 109 103  CO2 22 - 32 mmol/L 27 22 29   Calcium 8.9 - 10.3 mg/dL 9.5 9.2 9.3  Total Protein 6.5 - 8.1 g/dL - - 6.4(L)  Total Bilirubin 0.3 - 1.2 mg/dL - - 1.0  Alkaline Phos 38 - 126 U/L - - 73  AST 15 - 41 U/L - - 21  ALT 14 - 54 U/L - - 18   CBC Latest Ref Rng & Units 09/28/2018 09/27/2018 07/28/2016  WBC 4.0 - 10.5 K/uL 6.1 7.8 5.6  Hemoglobin 12.0 - 15.0 g/dL 12.2 13.9 13.0  Hematocrit 36.0 - 46.0 % 37.0 42.3 39.2  Platelets 150 - 400 K/uL 257 343 235   Lipid Panel     Component Value Date/Time   CHOL 193 07/20/2016 0514   TRIG 219 (H) 07/20/2016 0514   HDL 46 07/20/2016 0514   CHOLHDL 4.2 07/20/2016 0514   VLDL 44 (H) 07/20/2016 0514   LDLCALC 103 (H) 07/20/2016 0514   HEMOGLOBIN A1C Lab Results  Component Value Date   HGBA1C 5.4 07/20/2016   MPG 108 07/20/2016   TSH Recent Labs    09/27/18 2055  TSH 1.735     Medications   Medications Discontinued During This Encounter  Medication Reason  . sodium chloride (OCEAN) 0.65 % SOLN nasal spray Error  . clopidogrel (PLAVIX) 75 MG tablet Discontinued by provider   Current Meds  Medication Sig  . albuterol (VENTOLIN HFA) 108 (90 Base) MCG/ACT inhaler Inhale  1-2 puffs into the lungs every 4 (four) hours as needed for shortness of breath or wheezing.  . Calcium Citrate (CITRACAL PO) Take 2 tablets daily by mouth.   . Cyanocobalamin (B-12 PO) Take 1 tablet by mouth daily.  Marland Kitchen latanoprost (XALATAN) 0.005 % ophthalmic solution Place 1 drop into both eyes at bedtime.  Marland Kitchen lisinopril (ZESTRIL) 10 MG tablet Take 10 mg by mouth daily.  . NON FORMULARY Kyolic Garlic extract capsules; Take 2 capsules by mouth two times a  day  . NON FORMULARY Juice Plus Omega Oil Capsules: Take 2 capsules by mouth once a day  . simvastatin (ZOCOR) 20 MG tablet Take 20 mg by mouth at bedtime.  Marland Kitchen UNABLE TO FIND Juice Plus shakes: Drink 1 shake by mouth once a day  . [DISCONTINUED] clopidogrel (PLAVIX) 75 MG tablet Take 1 tablet (75 mg total) by mouth daily.    Cardiac Studies:   Echocardiogram 07/21/2016: Normal LV systolic function, mild LVH.  Regarding diastolic dysfunction.  Trace aortic regurgitation.    Assessment   Cerebrovascular accident (CVA), unspecified mechanism ([place]) - 06/2016 Left thalamic stroke, TIA 2014   Paroxysmal atrial fibrillation (HCC) - CHA2DS2-VASc Score is 5. Yearly risk of stroke 6.7%. EKG 09/27/18: AF - Plan: EKG 12-Lead, apixaban (ELIQUIS) 5 MG TABS tablet, PCV ECHOCARDIOGRAM COMPLETE, CBC. Discontinue Plavix  Essential hypertension - Plan: CMP14+EGFR,   Nonspecific abnormal electrocardiogram (ECG) (EKG) - Plan: EKG 12-Lead  Mixed hyperlipidemia - Plan: ezetimibe (ZETIA) 10 MG tablet, Lipid Panel With LDL/HDL Ratio  Chest pain of uncertain etiology   Stable angina pectoris ([place]) - Plan: PCV MYOCARDIAL PERFUSION WITH LEXISCAN   EKG 10/10/2018: Normal sinus rhythm at rate of 70 bpm, inferior infarct old.  Poor R-wave progression, cannot exclude anteroseptal infarct old.  Nonspecific ST depression in aVL.  EKG 09/27/2018: Atrial fibrillation with rapid ventricular response at the rate of 149 bpm, inferior infarct old, poor R-wave progression, low  voltage complexes.  Pulmonary disease pattern.  Repeat EKG normal sinus rhythm, inferior infarct old.  Poor R-wave progression. Low voltage. No evidence of ischemia.   Recommendations:   Patient presenting with new onset of atrial fibrillation with rapid ventricular response with extreme high risk for cardio-embolic phenomena with prior stroke and TIA.  She needs to be on long-term anticoagulation.  Her daughter is in a while healthcare, I discussed with her over the telephone regarding risks and benefits of anti-correlation, Coumadin versus newer agents.  Start with Eliquis 5 mg b.i.d.  Abnormal EKG suggesting inferior infarct.   Ischemic workup. Schedule for a Lexiscan Sestamibi stress test to evaluate for myocardial ischemia. Patient unable to do treadmill stress testinng due to prior stroke and mild gait disturbance and also avoiding due to COVID 19. Will schedule for an echocardiogram.   Her blood pressure is elevated today but will not make any changes as patient states that her blood pressure is usually well controlled.  Due to statin intolerance I have added Zetia 10 mg a day, she laid labs in 6 weeks.  Office visit following the work-up/investigations.  "Total time spent with patient was 80 minutes and greater than 50% of that time was spent in counseling and coordination care with the patient and/or family regarding new EKG findings, discussion regarding options of anticoagulation vs anti platelet therapy.     Adrian Prows, MD, [facility] 10/10/2018, 10:49 AM [facility]. [place] Pager: [phone] Office: [phone] If no answer Cell [phone]

## 2018-11-07 ENCOUNTER — Ambulatory Visit (INDEPENDENT_AMBULATORY_CARE_PROVIDER_SITE_OTHER): Payer: Medicare Other

## 2018-11-07 ENCOUNTER — Other Ambulatory Visit: Payer: Self-pay

## 2018-11-07 ENCOUNTER — Other Ambulatory Visit: Payer: Medicare Other

## 2018-11-07 DIAGNOSIS — I208 Other forms of angina pectoris: Secondary | ICD-10-CM

## 2018-11-07 DIAGNOSIS — I200 Unstable angina: Secondary | ICD-10-CM | POA: Diagnosis not present

## 2018-11-17 ENCOUNTER — Ambulatory Visit (INDEPENDENT_AMBULATORY_CARE_PROVIDER_SITE_OTHER): Payer: Medicare Other

## 2018-11-17 ENCOUNTER — Other Ambulatory Visit: Payer: Self-pay

## 2018-11-17 DIAGNOSIS — I480 Paroxysmal atrial fibrillation: Secondary | ICD-10-CM | POA: Diagnosis not present

## 2018-11-22 DIAGNOSIS — I10 Essential (primary) hypertension: Secondary | ICD-10-CM | POA: Diagnosis not present

## 2018-11-22 DIAGNOSIS — Z049 Encounter for examination and observation for unspecified reason: Secondary | ICD-10-CM | POA: Diagnosis not present

## 2018-11-22 DIAGNOSIS — E782 Mixed hyperlipidemia: Secondary | ICD-10-CM | POA: Diagnosis not present

## 2018-12-05 DIAGNOSIS — L820 Inflamed seborrheic keratosis: Secondary | ICD-10-CM | POA: Diagnosis not present

## 2018-12-05 DIAGNOSIS — D485 Neoplasm of uncertain behavior of skin: Secondary | ICD-10-CM | POA: Diagnosis not present

## 2018-12-05 DIAGNOSIS — D225 Melanocytic nevi of trunk: Secondary | ICD-10-CM | POA: Diagnosis not present

## 2018-12-05 DIAGNOSIS — L821 Other seborrheic keratosis: Secondary | ICD-10-CM | POA: Diagnosis not present

## 2018-12-05 DIAGNOSIS — D1801 Hemangioma of skin and subcutaneous tissue: Secondary | ICD-10-CM | POA: Diagnosis not present

## 2018-12-05 DIAGNOSIS — Z85828 Personal history of other malignant neoplasm of skin: Secondary | ICD-10-CM | POA: Diagnosis not present

## 2018-12-15 ENCOUNTER — Other Ambulatory Visit: Payer: Self-pay

## 2018-12-15 ENCOUNTER — Encounter: Payer: Self-pay | Admitting: Cardiology

## 2018-12-15 ENCOUNTER — Ambulatory Visit (INDEPENDENT_AMBULATORY_CARE_PROVIDER_SITE_OTHER): Payer: Medicare Other | Admitting: Cardiology

## 2018-12-15 VITALS — BP 130/80 | HR 74 | Ht 63.0 in | Wt 172.1 lb

## 2018-12-15 DIAGNOSIS — I639 Cerebral infarction, unspecified: Secondary | ICD-10-CM

## 2018-12-15 DIAGNOSIS — I6930 Unspecified sequelae of cerebral infarction: Secondary | ICD-10-CM

## 2018-12-15 DIAGNOSIS — I10 Essential (primary) hypertension: Secondary | ICD-10-CM | POA: Diagnosis not present

## 2018-12-15 DIAGNOSIS — E782 Mixed hyperlipidemia: Secondary | ICD-10-CM | POA: Diagnosis not present

## 2018-12-15 DIAGNOSIS — I480 Paroxysmal atrial fibrillation: Secondary | ICD-10-CM | POA: Diagnosis not present

## 2018-12-15 MED ORDER — EZETIMIBE 10 MG PO TABS: 10.0000 mg | ORAL_TABLET | Freq: Every day | ORAL | 3 refills | Status: DC

## 2018-12-15 MED ORDER — APIXABAN 5 MG PO TABS: 5.0000 mg | ORAL_TABLET | Freq: Two times a day (BID) | ORAL | 1 refills | Status: DC

## 2018-12-15 NOTE — Progress Notes (Signed)
Primary Physician/Referring:  Ferd Hibbs, NP  [name], female    DOB: 1946/04/21, 73 y.o.   MRN: [id number]  Chief Complaint  Patient presents with  . Atrial Fibrillation  . Chest Pain  . lab results    echo, nuc  . Follow-up    2 mth   HPI: Catherine [name]  is a 73 y.o. female  with paroxysmal atrial fibrillation, hyperlipidemia, hypertension, history of prior stroke in 07-29-2016 with residual left sided weakness and smell and taste being altered, stroke felt to be due to atherosclerotic disease, and history of statin intolerance with inability to take greater than 20 mg of simvastatin.   Due to high risk for cardio embolic phenomenon and PAF, she was started on Eliquis at her last visit. She was noted to have abnormal EKG suggesting inferior infarct, underwent lexiscan nuclear stress test and echocardiogram and now presents for follow up. She was also started on Zetia at her last visit.   She has not had any further chest pressure. No new complaints. She is tolerating Zetia without complications.  She is also taking Eliquis regularly, but is wondering about how long to take the medication.  Past Medical History:  Diagnosis Date  . Arthritis   . Cataract   . GERD (gastroesophageal reflux disease)   . Glaucoma   . Hyperlipidemia   . Hypertension   . Stroke ([place])   . TIA (transient ischemic attack)     Past Surgical History:  Procedure Laterality Date  . EYE SURGERY    . KNEE SURGERY Right    complete  . TONSILLECTOMY      Social History   Socioeconomic History  . Marital status: Widowed    Spouse name: Not on file  . Number of children: 3  . Years of education: College  . Highest education level: Not on file  Occupational History  . Occupation: Retired  Scientific laboratory technician  . Financial resource strain: Not on file  . Food insecurity    Worry: Not on file    Inability: Not on file  . Transportation needs    Medical: Not on file    Non-medical: Not  on file  Tobacco Use  . Smoking status: Never Smoker  . Smokeless tobacco: Never Used  Substance and Sexual Activity  . Alcohol use: No  . Drug use: No  . Sexual activity: Yes  Lifestyle  . Physical activity    Days per week: Not on file    Minutes per session: Not on file  . Stress: Not on file  Relationships  . Social Herbalist on phone: Not on file    Gets together: Not on file    Attends religious service: Not on file    Active member of club or organization: Not on file    Attends meetings of clubs or organizations: Not on file    Relationship status: Not on file  . Intimate partner violence    Fear of current or ex partner: Not on file    Emotionally abused: Not on file    Physically abused: Not on file    Forced sexual activity: Not on file  Other Topics Concern  . Not on file  Social History Narrative   Husband passed away in Jul 29, 2013.   Now lives w/ her daughter.   Review of Systems  Constitution: Negative for chills, decreased appetite, malaise/fatigue and weight gain.  Cardiovascular: Negative for dyspnea on exertion,  leg swelling and syncope.  Endocrine: Negative for cold intolerance.  Hematologic/Lymphatic: Does not bruise/bleed easily.  Musculoskeletal: Positive for back pain and joint pain. Negative for joint swelling.  Gastrointestinal: Negative for abdominal pain, anorexia, change in bowel habit, hematochezia and melena.  Neurological: Positive for focal weakness (left sided, minimal). Negative for headaches and light-headedness.  Psychiatric/Behavioral: Negative for depression and substance abuse.  All other systems reviewed and are negative.  Objective  Blood pressure 130/80, pulse 74, height 5\' 3"  (1.6 m), weight 172 lb 1.6 oz (78.1 kg), SpO2 99 %. Body mass index is 30.49 kg/m.    Physical Exam  Constitutional: No distress.  Short stature, mildly obese  HENT:  Head: Atraumatic.  Eyes: Conjunctivae are normal.  Neck: Neck supple. No JVD  present. No thyromegaly present.  Cardiovascular: Normal rate, regular rhythm, S1 normal, S2 normal and intact distal pulses. Exam reveals no gallop.  Murmur heard.  Scratchy early systolic murmur is present with a grade of 2/6 at the upper right sternal border. Pulses:      Carotid pulses are on the right side with bruit. Pulmonary/Chest: Effort normal and breath sounds normal.  Abdominal: Soft. Bowel sounds are normal.  Musculoskeletal: Normal range of motion.  Neurological: She is alert.  Skin: Skin is warm and dry.  Psychiatric: She has a normal mood and affect.   Radiology: No results found.  Laboratory examination:   CMP Latest Ref Rng & Units 09/28/2018 09/27/2018 07/22/2016  Glucose 70 - 99 mg/dL 104(H) 130(H) 102(H)  BUN 8 - 23 mg/dL 10 11 14   Creatinine 0.44 - 1.00 mg/dL 0.86 0.74 0.70  Sodium 135 - 145 mmol/L 142 142 140  Potassium 3.5 - 5.1 mmol/L 4.2 4.0 3.6  Chloride 98 - 111 mmol/L 106 109 103  CO2 22 - 32 mmol/L 27 22 29   Calcium 8.9 - 10.3 mg/dL 9.5 9.2 9.3  Total Protein 6.5 - 8.1 g/dL - - 6.4(L)  Total Bilirubin 0.3 - 1.2 mg/dL - - 1.0  Alkaline Phos 38 - 126 U/L - - 73  AST 15 - 41 U/L - - 21  ALT 14 - 54 U/L - - 18   CBC Latest Ref Rng & Units 09/28/2018 09/27/2018 07/28/2016  WBC 4.0 - 10.5 K/uL 6.1 7.8 5.6  Hemoglobin 12.0 - 15.0 g/dL 12.2 13.9 13.0  Hematocrit 36.0 - 46.0 % 37.0 42.3 39.2  Platelets 150 - 400 K/uL 257 343 235   Lipid Panel     Component Value Date/Time   CHOL 193 07/20/2016 0514   TRIG 219 (H) 07/20/2016 0514   HDL 46 07/20/2016 0514   CHOLHDL 4.2 07/20/2016 0514   VLDL 44 (H) 07/20/2016 0514   LDLCALC 103 (H) 07/20/2016 0514   HEMOGLOBIN A1C Lab Results  Component Value Date   HGBA1C 5.4 07/20/2016   MPG 108 07/20/2016   TSH Recent Labs    09/27/18 2055  TSH 1.735     Medications   Medications Discontinued During This Encounter  Medication Reason  . diclofenac sodium (VOLTAREN) 1 % GEL Error  . apixaban (ELIQUIS) 5 MG  TABS tablet Reorder  . ezetimibe (ZETIA) 10 MG tablet Reorder   Current Meds  Medication Sig  . albuterol (VENTOLIN HFA) 108 (90 Base) MCG/ACT inhaler Inhale 1-2 puffs into the lungs every 4 (four) hours as needed for shortness of breath or wheezing.  Marland Kitchen apixaban (ELIQUIS) 5 MG TABS tablet Take 1 tablet (5 mg total) by mouth 2 (two) times daily.  Marland Kitchen  Ascorbic Acid (VITAMIN C GUMMIES PO) Take by mouth daily.  Marland Kitchen BLACK ELDERBERRY,BERRY-FLOWER, PO Take by mouth. Juice, 1 table spoon daily  . Calcium Citrate (CITRACAL PO) Take 2 tablets daily by mouth.   . Cyanocobalamin (B-12 PO) Take 1 tablet by mouth daily.  Marland Kitchen ezetimibe (ZETIA) 10 MG tablet Take 1 tablet (10 mg total) by mouth daily.  Marland Kitchen latanoprost (XALATAN) 0.005 % ophthalmic solution Place 1 drop into both eyes at bedtime.  Marland Kitchen lisinopril (ZESTRIL) 10 MG tablet Take 10 mg by mouth daily.  . milk thistle 175 MG tablet Take 175 mg by mouth daily.  . Multiple Vitamins-Minerals (PRESERVISION AREDS 2 PO) Take by mouth daily.  . NON FORMULARY Kyolic Garlic extract capsules; Take 2 capsules by mouth two times a day  . NON FORMULARY Juice Plus Omega Oil Capsules: Take 2 capsules by mouth once a day  . Probiotic Product (PROBIOTIC-10 PO) Take by mouth.  . simvastatin (ZOCOR) 20 MG tablet Take 20 mg by mouth at bedtime.  Marland Kitchen UNABLE TO FIND Juice Plus shakes: Drink 1 shake by mouth once a day  . [DISCONTINUED] apixaban (ELIQUIS) 5 MG TABS tablet Take 1 tablet (5 mg total) by mouth 2 (two) times daily.  . [DISCONTINUED] ezetimibe (ZETIA) 10 MG tablet Take 1 tablet (10 mg total) by mouth daily.    Cardiac Studies:   Echocardiogram 11/17/2018: Normal LV systolic function with EF 69%. Left ventricle cavity is normal in size. Moderate concentric hypertrophy of the left ventricle. Normal global wall motion. Doppler evidence of grade I (impaired) diastolic dysfunction, normal LAP. Calculated EF 69%. Trileaflet aortic valve. Mild to moderate aortic regurgitation.  Mild (Grade I) mitral regurgitation. Mild to moderate tricuspid regurgitation. Estimated pulmonary artery systolic pressure is 28 mmHg. Mild increase in severity of valvular regurgitation since last study in 2015.    [facility] Stress Test 11/07/2018: The resting electrocardiogram demonstrated normal sinus rhythm and normal resting conduction.  Poor R wave progression. Stress EKG is non diagnostic for ischemia as it is a pharmacologic stress using Lexiscan. Perfusion imaging study demonstrates mild apical thinning but no evidence of ischemia or infarct.  LVEF is normal at 79%. Low risk study.  Assessment     ICD-10-CM   1. Paroxysmal atrial fibrillation (HCC)  I48.0 apixaban (ELIQUIS) 5 MG TABS tablet    CANCELED: EKG 12-Lead   CHA2DS2-VASc Score is 7.  Yearly risk of stroke: 9.8%.   2. History of cerebrovascular accident with residual deficit  I69.30    07/19/2916: Right hemiparesis. Cerebral atherosclerosis  3. Essential hypertension  I10   4. Mixed hyperlipidemia  E78.2 ezetimibe (ZETIA) 10 MG tablet  5. Paroxysmal atrial fibrillation (HCC)  I48.0 apixaban (ELIQUIS) 5 MG TABS tablet    CANCELED: EKG 12-Lead   CHA2DS2-VASc Score is 5. Yearly risk of stroke 6.7%. EKG 09/27/18: AF    EKG 10/10/2018: Normal sinus rhythm at rate of 70 bpm, inferior infarct old.  Poor R-wave progression, cannot exclude anteroseptal infarct old.  Nonspecific ST depression in aVL.  EKG 09/27/2018: Atrial fibrillation with rapid ventricular response at the rate of 149 bpm, inferior infarct old, poor R-wave progression, low voltage complexes.  Pulmonary disease pattern.  Repeat EKG normal sinus rhythm, inferior infarct old.  Poor R-wave progression. Low voltage. No evidence of ischemia.   Recommendations:   Patient was seen by me on 10/10/2018, I started her on Zetia which she is tolerating.  She is also tolerating liquids without bleeding diathesis.  Today her blood  pressure is well controlled, her  lipids have been ordered which she'll obtain labs in one month.  Importance of long-term anticoagulation discussed with the patient and she is willing to be compliant.  Risks and benefits again discussed with the patient.  I'll see her back in 3 months, she requests patient assistance for Eliquis which I filled the forms. She will obtain lipid profile testing along with CMP prior to her next office visit.  She is tolerating Zetia which have added to 20 mg of simvastatin.  Adrian Prows, MD, [facility] 12/15/2018, 2:37 PM [facility]. [place] Pager: [phone] Office: [phone]  If no answer Cell [phone]

## 2018-12-19 DIAGNOSIS — L03119 Cellulitis of unspecified part of limb: Secondary | ICD-10-CM | POA: Diagnosis not present

## 2018-12-29 DIAGNOSIS — L03119 Cellulitis of unspecified part of limb: Secondary | ICD-10-CM | POA: Diagnosis not present

## 2019-01-13 DIAGNOSIS — M1712 Unilateral primary osteoarthritis, left knee: Secondary | ICD-10-CM | POA: Diagnosis not present

## 2019-01-13 DIAGNOSIS — M1711 Unilateral primary osteoarthritis, right knee: Secondary | ICD-10-CM | POA: Diagnosis not present

## 2019-01-13 DIAGNOSIS — M25569 Pain in unspecified knee: Secondary | ICD-10-CM | POA: Diagnosis not present

## 2019-01-16 DIAGNOSIS — I10 Essential (primary) hypertension: Secondary | ICD-10-CM | POA: Diagnosis not present

## 2019-01-16 DIAGNOSIS — E782 Mixed hyperlipidemia: Secondary | ICD-10-CM | POA: Diagnosis not present

## 2019-01-17 DIAGNOSIS — I480 Paroxysmal atrial fibrillation: Secondary | ICD-10-CM | POA: Diagnosis not present

## 2019-01-17 LAB — LIPID PANEL WITH LDL/HDL RATIO
Cholesterol, Total: 171 mg/dL (ref 100–199)
HDL: 49 mg/dL (ref >39)
LDL Chol Calc (NIH): 86 mg/dL (ref 0–99)
LDL/HDL Ratio: 1.8 ratio (ref 0.0–3.2)
Triglycerides: 218 mg/dL — ABNORMAL HIGH (ref 0–149)
VLDL Cholesterol Cal: 36 mg/dL (ref 5–40)

## 2019-01-17 LAB — CMP14+EGFR
ALT: 16 IU/L (ref 0–32)
AST: 20 IU/L (ref 0–40)
Albumin/Globulin Ratio: 2.1 (ref 1.2–2.2)
Albumin: 4.6 g/dL (ref 3.7–4.7)
Alkaline Phosphatase: 101 IU/L (ref 39–117)
BUN/Creatinine Ratio: 14 (ref 12–28)
BUN: 11 mg/dL (ref 8–27)
Bilirubin Total: 1 mg/dL (ref 0.0–1.2)
CO2: 27 mmol/L (ref 20–29)
Calcium: 10.1 mg/dL (ref 8.7–10.3)
Chloride: 104 mmol/L (ref 96–106)
Creatinine, Ser: 0.8 mg/dL (ref 0.57–1.00)
GFR calc Af Amer: 84 mL/min/{1.73_m2} (ref >59)
GFR calc non Af Amer: 73 mL/min/{1.73_m2} (ref >59)
Globulin, Total: 2.2 g/dL (ref 1.5–4.5)
Glucose: 101 mg/dL — ABNORMAL HIGH (ref 65–99)
Potassium: 5.1 mmol/L (ref 3.5–5.2)
Sodium: 144 mmol/L (ref 134–144)
Total Protein: 6.8 g/dL (ref 6.0–8.5)

## 2019-01-17 LAB — CBC
Hematocrit: 40.2 % (ref 34.0–46.6)
Hemoglobin: 13.7 g/dL (ref 11.1–15.9)
MCH: 30.6 pg (ref 26.6–33.0)
MCHC: 34.1 g/dL (ref 31.5–35.7)
MCV: 90 fL (ref 79–97)
Platelets: 260 10*3/uL (ref 150–450)
RBC: 4.47 x10E6/uL (ref 3.77–5.28)
RDW: 12.9 % (ref 11.7–15.4)
WBC: 5.3 10*3/uL (ref 3.4–10.8)

## 2019-01-19 DIAGNOSIS — H04123 Dry eye syndrome of bilateral lacrimal glands: Secondary | ICD-10-CM | POA: Diagnosis not present

## 2019-01-19 DIAGNOSIS — H40013 Open angle with borderline findings, low risk, bilateral: Secondary | ICD-10-CM | POA: Diagnosis not present

## 2019-01-19 DIAGNOSIS — Z961 Presence of intraocular lens: Secondary | ICD-10-CM | POA: Diagnosis not present

## 2019-02-06 DIAGNOSIS — L259 Unspecified contact dermatitis, unspecified cause: Secondary | ICD-10-CM | POA: Diagnosis not present

## 2019-03-02 DIAGNOSIS — Z23 Encounter for immunization: Secondary | ICD-10-CM | POA: Diagnosis not present

## 2019-03-27 ENCOUNTER — Other Ambulatory Visit: Payer: Self-pay

## 2019-03-27 ENCOUNTER — Ambulatory Visit (INDEPENDENT_AMBULATORY_CARE_PROVIDER_SITE_OTHER): Payer: Medicare Other | Admitting: Cardiology

## 2019-03-27 ENCOUNTER — Encounter: Payer: Self-pay | Admitting: Cardiology

## 2019-03-27 VITALS — BP 138/65 | HR 90 | Temp 98.2°F | Ht 63.0 in | Wt 170.0 lb

## 2019-03-27 DIAGNOSIS — I10 Essential (primary) hypertension: Secondary | ICD-10-CM | POA: Diagnosis not present

## 2019-03-27 DIAGNOSIS — E782 Mixed hyperlipidemia: Secondary | ICD-10-CM

## 2019-03-27 DIAGNOSIS — I480 Paroxysmal atrial fibrillation: Secondary | ICD-10-CM | POA: Diagnosis not present

## 2019-03-27 DIAGNOSIS — I639 Cerebral infarction, unspecified: Secondary | ICD-10-CM

## 2019-03-27 NOTE — Progress Notes (Signed)
Primary Physician/Referring:  Ferd Hibbs, NP  [name], female    DOB: 1946/03/11, 73 y.o.   MRN: [id number]  Chief Complaint  Patient presents with  . Atrial Fibrillation    3 month f/u   HPI: Catherine [name]  is a 73 y.o. female  with paroxysmal atrial fibrillation, hyperlipidemia, hypertension, history of prior stroke in 18-Aug-2016 with residual left sided weakness and smell and taste being altered, stroke felt to be due to atherosclerotic disease, and history of statin intolerance with inability to take greater than 20 mg of simvastatin, on her last visit added Zetia which she is tolerating and underwent labs and presents for f/u.   She has not had any further chest pressure. No new complaints. She is tolerating Zetia without complications.  She is also taking Eliquis regularly.   Past Medical History:  Diagnosis Date  . Arthritis   . Cataract   . GERD (gastroesophageal reflux disease)   . Glaucoma   . Hyperlipidemia   . Hypertension   . Stroke ([place])   . TIA (transient ischemic attack)     Past Surgical History:  Procedure Laterality Date  . EYE SURGERY    . KNEE SURGERY Right    complete  . TONSILLECTOMY      Social History   Socioeconomic History  . Marital status: Widowed    Spouse name: Not on file  . Number of children: 3  . Years of education: College  . Highest education level: Not on file  Occupational History  . Occupation: Retired  Scientific laboratory technician  . Financial resource strain: Not on file  . Food insecurity    Worry: Not on file    Inability: Not on file  . Transportation needs    Medical: Not on file    Non-medical: Not on file  Tobacco Use  . Smoking status: Never Smoker  . Smokeless tobacco: Never Used  Substance and Sexual Activity  . Alcohol use: No  . Drug use: No  . Sexual activity: Yes  Lifestyle  . Physical activity    Days per week: Not on file    Minutes per session: Not on file  . Stress: Not on file   Relationships  . Social Herbalist on phone: Not on file    Gets together: Not on file    Attends religious service: Not on file    Active member of club or organization: Not on file    Attends meetings of clubs or organizations: Not on file    Relationship status: Not on file  . Intimate partner violence    Fear of current or ex partner: Not on file    Emotionally abused: Not on file    Physically abused: Not on file    Forced sexual activity: Not on file  Other Topics Concern  . Not on file  Social History Narrative   Husband passed away in 08-18-2013.   Now lives w/ her daughter.   Review of Systems  Constitution: Negative for chills, decreased appetite, malaise/fatigue and weight gain.  Cardiovascular: Negative for dyspnea on exertion, leg swelling and syncope.  Endocrine: Negative for cold intolerance.  Hematologic/Lymphatic: Does not bruise/bleed easily.  Musculoskeletal: Positive for back pain and joint pain. Negative for joint swelling.  Gastrointestinal: Negative for abdominal pain, anorexia, change in bowel habit, hematochezia and melena.  Neurological: Positive for focal weakness (left sided, minimal). Negative for headaches and light-headedness.  Psychiatric/Behavioral: Negative for  depression and substance abuse.  All other systems reviewed and are negative.  Objective   Vitals with BMI 03/27/2019 12/15/2018 10/10/2018  Height 5\' 3"  5\' 3"  5\' 3"   Weight 170 lbs 172 lbs 2 oz 174 lbs  BMI 30.12 [id number]  Systolic 0000000 [id number]  Diastolic 65 80 78  Pulse 90 74 77    Physical Exam  Constitutional: No distress.  Short stature, mildly obese  HENT:  Head: Atraumatic.  Eyes: Conjunctivae are normal.  Neck: Neck supple. No JVD present. No thyromegaly present.  Cardiovascular: Normal rate, regular rhythm, S1 normal, S2 normal, intact distal pulses and normal pulses. Exam reveals no gallop.  Murmur heard.  Scratchy early systolic murmur is present with a grade of  2/6 at the upper right sternal border. Pulmonary/Chest: Effort normal and breath sounds normal.  Abdominal: Soft. Bowel sounds are normal.  Musculoskeletal: Normal range of motion.  Neurological: She is alert.  Skin: Skin is warm and dry.  Psychiatric: She has a normal mood and affect.   Radiology: No results found.  Laboratory examination:   CMP Latest Ref Rng & Units 01/16/2019 09/28/2018 09/27/2018  Glucose 65 - 99 mg/dL 101(H) 104(H) 130(H)  BUN 8 - 27 mg/dL 11 10 11   Creatinine 0.57 - 1.00 mg/dL 0.80 0.86 0.74  Sodium 134 - 144 mmol/L 144 142 142  Potassium 3.5 - 5.2 mmol/L 5.1 4.2 4.0  Chloride 96 - 106 mmol/L 104 106 109  CO2 20 - 29 mmol/L 27 27 22   Calcium 8.7 - 10.3 mg/dL 10.1 9.5 9.2  Total Protein 6.0 - 8.5 g/dL 6.8 - -  Total Bilirubin 0.0 - 1.2 mg/dL 1.0 - -  Alkaline Phos 39 - 117 IU/L 101 - -  AST 0 - 40 IU/L 20 - -  ALT 0 - 32 IU/L 16 - -   CBC Latest Ref Rng & Units 01/17/2019 09/28/2018 09/27/2018  WBC 3.4 - 10.8 x10E3/uL 5.3 6.1 7.8  Hemoglobin 11.1 - 15.9 g/dL 13.7 12.2 13.9  Hematocrit 34.0 - 46.6 % 40.2 37.0 42.3  Platelets 150 - 450 x10E3/uL 260 257 343   Lipid Panel     Component Value Date/Time   CHOL 171 01/16/2019 0959   TRIG 218 (H) 01/16/2019 0959   HDL 49 01/16/2019 0959   CHOLHDL 4.2 07/20/2016 0514   VLDL 44 (H) 07/20/2016 0514   LDLCALC 86 01/16/2019 0959   HEMOGLOBIN A1C Lab Results  Component Value Date   HGBA1C 5.4 07/20/2016   MPG 108 07/20/2016   TSH Recent Labs    09/27/18 2055  TSH 1.735   Medications   There are no discontinued medications. Current Meds  Medication Sig  . albuterol (VENTOLIN HFA) 108 (90 Base) MCG/ACT inhaler Inhale 1-2 puffs into the lungs every 4 (four) hours as needed for shortness of breath or wheezing.  Marland Kitchen apixaban (ELIQUIS) 5 MG TABS tablet Take 1 tablet (5 mg total) by mouth 2 (two) times daily.  . Ascorbic Acid (VITAMIN C GUMMIES PO) Take by mouth daily.  Marland Kitchen BLACK ELDERBERRY,BERRY-FLOWER, PO Take by  mouth. Juice, 1 table spoon daily  . Calcium Citrate (CITRACAL PO) Take 2 tablets daily by mouth.   . Cyanocobalamin (B-12 PO) Take 1 tablet by mouth daily.  Marland Kitchen ezetimibe (ZETIA) 10 MG tablet Take 1 tablet (10 mg total) by mouth daily.  Marland Kitchen latanoprost (XALATAN) 0.005 % ophthalmic solution Place 1 drop into both eyes at bedtime.  Marland Kitchen lisinopril (ZESTRIL) 10 MG tablet Take 10  mg by mouth daily.  . milk thistle 175 MG tablet Take 175 mg by mouth daily.  . Multiple Vitamins-Minerals (PRESERVISION AREDS 2 PO) Take by mouth daily.  . NON FORMULARY Kyolic Garlic extract capsules; Take 2 capsules by mouth two times a day  . NON FORMULARY Juice Plus Omega Oil Capsules: Take 2 capsules by mouth once a day  . Probiotic Product (PROBIOTIC-10 PO) Take by mouth.  . simvastatin (ZOCOR) 20 MG tablet Take 20 mg by mouth at bedtime.  Marland Kitchen UNABLE TO FIND Juice Plus shakes: Drink 1 shake by mouth once a day    Cardiac Studies:   Echocardiogram 11/17/2018: Normal LV systolic function with EF 69%. Left ventricle cavity is normal in size. Moderate concentric hypertrophy of the left ventricle. Normal global wall motion. Doppler evidence of grade I (impaired) diastolic dysfunction, normal LAP. Calculated EF 69%. Trileaflet aortic valve. Mild to moderate aortic regurgitation. Mild (Grade I) mitral regurgitation. Mild to moderate tricuspid regurgitation. Estimated pulmonary artery systolic pressure is 28 mmHg. Mild increase in severity of valvular regurgitation since last study in 2015.    [place] Stress Test 11/07/2018: The resting electrocardiogram demonstrated normal sinus rhythm and normal resting conduction.  Poor R wave progression. Stress EKG is non diagnostic for ischemia as it is a pharmacologic stress using Lexiscan. Perfusion imaging study demonstrates mild apical thinning but no evidence of ischemia or infarct.  LVEF is normal at 79%. Low risk study.  Assessment     ICD-10-CM   1. Paroxysmal atrial  fibrillation (HCC)  I48.0    CHA2DS2-VASc Score is 5. Yearly risk of stroke 6.7%.  2. Mixed hyperlipidemia  E78.2   3. Essential hypertension  I10     EKG 10/10/2018: Normal sinus rhythm at rate of 70 bpm, inferior infarct old.  Poor R-wave progression, cannot exclude anteroseptal infarct old.  Nonspecific ST depression in aVL.  EKG 09/27/2018: Atrial fibrillation with rapid ventricular response at the rate of 149 bpm, inferior infarct old, poor R-wave progression, low voltage complexes.  Pulmonary disease pattern.  Recommendations:   Catherine [name]  is a 73 y.o. female  with paroxysmal atrial fibrillation, hyperlipidemia, hypertension, history of prior stroke in 2018 with residual left sided weakness and smell and taste being altered, stroke felt to be due to atherosclerotic disease, and history of statin intolerance with inability to take greater than 20 mg of simvastatin, on her last visit added Zetia which she is tolerating and underwent labs and presents for f/u.   She is presently doing well, blood pressure is well controlled, lipids are also under excellent control.  All her cardiovascular risk factors are well controlled.  Continue anti-coagulation for atrial fibrillation, I will see her back on an annual basis.  I reviewed the results of the recently performed labs with her. Elevated triglycerides can be improved upon with diet modification.  Adrian Prows, MD, [facility] 03/27/2019, 11:34 AM [facility] [place] Pager: [phone] Office: [phone]  If no answer Cell [phone]

## 2019-04-14 ENCOUNTER — Other Ambulatory Visit: Payer: Self-pay

## 2019-04-14 DIAGNOSIS — I480 Paroxysmal atrial fibrillation: Secondary | ICD-10-CM

## 2019-04-14 MED ORDER — APIXABAN 5 MG PO TABS: 5.0000 mg | ORAL_TABLET | Freq: Two times a day (BID) | ORAL | 1 refills | Status: DC

## 2019-05-01 ENCOUNTER — Other Ambulatory Visit: Payer: Self-pay | Admitting: Cardiology

## 2019-05-01 DIAGNOSIS — E782 Mixed hyperlipidemia: Secondary | ICD-10-CM

## 2019-08-02 ENCOUNTER — Other Ambulatory Visit: Payer: Self-pay | Admitting: Cardiology

## 2019-08-02 DIAGNOSIS — E782 Mixed hyperlipidemia: Secondary | ICD-10-CM

## 2019-11-01 ENCOUNTER — Other Ambulatory Visit: Payer: Self-pay | Admitting: Cardiology

## 2019-11-01 DIAGNOSIS — I480 Paroxysmal atrial fibrillation: Secondary | ICD-10-CM

## 2020-03-25 ENCOUNTER — Other Ambulatory Visit: Payer: Self-pay

## 2020-03-25 ENCOUNTER — Ambulatory Visit: Payer: Medicare Other | Admitting: Cardiology

## 2020-03-25 ENCOUNTER — Encounter: Payer: Self-pay | Admitting: Cardiology

## 2020-03-25 VITALS — BP 163/71 | HR 72 | Resp 16 | Ht 63.0 in | Wt 169.8 lb

## 2020-03-25 DIAGNOSIS — I10 Essential (primary) hypertension: Secondary | ICD-10-CM

## 2020-03-25 DIAGNOSIS — I480 Paroxysmal atrial fibrillation: Secondary | ICD-10-CM

## 2020-03-25 DIAGNOSIS — I6930 Unspecified sequelae of cerebral infarction: Secondary | ICD-10-CM

## 2020-03-25 DIAGNOSIS — E782 Mixed hyperlipidemia: Secondary | ICD-10-CM

## 2020-03-25 MED ORDER — LISINOPRIL 20 MG PO TABS: 20.0000 mg | ORAL_TABLET | Freq: Every day | ORAL | 3 refills | Status: DC

## 2020-03-25 NOTE — Progress Notes (Signed)
Primary Physician/Referring:  Ferd Hibbs, NP  [name], female    DOB: [birth year], 74 y.o.   MRN: [id number]  Chief Complaint  Patient presents with  . Paroxysmal atrial fibrillation  . Hypertension  . Hyperlipidemia  . Follow-up    1 year   HPI: Catherine [name]  is a 74 y.o. female  with paroxysmal atrial fibrillation, hyperlipidemia, hypertension, history of prior stroke in 2016/07/24 with residual left sided weakness and smell and taste being altered, stroke felt to be due to atherosclerotic disease, and history of statin intolerance with inability to take greater than 20 mg of simvastatin, presently on Zetia which she is tolerating and underwent labs and presents for f/u.  This is her annual visit, she has no specific complaints.  Due to elevated triglycerides she states that her diet has been modified.  She has been monitoring her blood pressure and states that it is well controlled.  Past Medical History:  Diagnosis Date  . Arthritis   . Cataract   . GERD (gastroesophageal reflux disease)   . Glaucoma   . Hyperlipidemia   . Hypertension   . Stroke ([place])   . TIA (transient ischemic attack)     Past Surgical History:  Procedure Laterality Date  . EYE SURGERY    . KNEE SURGERY Right    complete  . TONSILLECTOMY      Social History   Socioeconomic History  . Marital status: Widowed    Spouse name: Not on file  . Number of children: 3  . Years of education: College  . Highest education level: Not on file  Occupational History  . Occupation: Retired  Tobacco Use  . Smoking status: Never Smoker  . Smokeless tobacco: Never Used  Vaping Use  . Vaping Use: Never assessed  Substance and Sexual Activity  . Alcohol use: No  . Drug use: No  . Sexual activity: Yes  Other Topics Concern  . Not on file  Social History Narrative   Husband passed away in Jul 24, 2013.   Now lives w/ her daughter.   Social Determinants of Health   Financial Resource  Strain:   . Difficulty of Paying Living Expenses: Not on file  Food Insecurity:   . Worried About Charity fundraiser in the Last Year: Not on file  . Ran Out of Food in the Last Year: Not on file  Transportation Needs:   . Lack of Transportation (Medical): Not on file  . Lack of Transportation (Non-Medical): Not on file  Physical Activity:   . Days of Exercise per Week: Not on file  . Minutes of Exercise per Session: Not on file  Stress:   . Feeling of Stress : Not on file  Social Connections:   . Frequency of Communication with Friends and Family: Not on file  . Frequency of Social Gatherings with Friends and Family: Not on file  . Attends Religious Services: Not on file  . Active Member of Clubs or Organizations: Not on file  . Attends Archivist Meetings: Not on file  . Marital Status: Not on file  Intimate Partner Violence:   . Fear of Current or Ex-Partner: Not on file  . Emotionally Abused: Not on file  . Physically Abused: Not on file  . Sexually Abused: Not on file   Review of Systems  Constitutional: Negative for chills, decreased appetite, malaise/fatigue and weight gain.  Cardiovascular: Negative for dyspnea on exertion, leg swelling and syncope.  Endocrine: Negative for cold intolerance.  Hematologic/Lymphatic: Does not bruise/bleed easily.  Musculoskeletal: Positive for back pain and joint pain. Negative for joint swelling.  Gastrointestinal: Negative for abdominal pain, anorexia, change in bowel habit, hematochezia and melena.  Neurological: Positive for focal weakness (left sided, minimal). Negative for headaches and light-headedness.  Psychiatric/Behavioral: Negative for depression and substance abuse.  All other systems reviewed and are negative.  Objective   Vitals with BMI 03/25/2020 03/25/2020 03/27/2019  Height - 5\' 3"  5\' 3"   Weight - 169 lbs 13 oz 170 lbs  BMI - 97.02 63.78  Systolic 588 502 774  Diastolic 71 74 65  Pulse 72 74 90      Physical Exam Constitutional:      General: She is not in acute distress.    Comments: Short stature, mildly obese  HENT:     Head: Atraumatic.  Eyes:     Conjunctiva/sclera: Conjunctivae normal.  Neck:     Thyroid: No thyromegaly.     Vascular: No JVD.  Cardiovascular:     Rate and Rhythm: Normal rate and regular rhythm.     Pulses: Normal pulses and intact distal pulses.     Heart sounds: S1 normal and S2 normal. No murmur heard.  No gallop.   Pulmonary:     Effort: Pulmonary effort is normal.     Breath sounds: Normal breath sounds.  Abdominal:     General: Bowel sounds are normal.     Palpations: Abdomen is soft.  Musculoskeletal:        General: Normal range of motion.     Cervical back: Neck supple.  Skin:    General: Skin is warm and dry.  Neurological:     Mental Status: She is alert.    Radiology: No results found.  Laboratory examination:   CMP Latest Ref Rng & Units 01/16/2019 09/28/2018 09/27/2018  Glucose 65 - 99 mg/dL 101(H) 104(H) 130(H)  BUN 8 - 27 mg/dL 11 10 11   Creatinine 0.57 - 1.00 mg/dL 0.80 0.86 0.74  Sodium 134 - 144 mmol/L 144 142 142  Potassium 3.5 - 5.2 mmol/L 5.1 4.2 4.0  Chloride 96 - 106 mmol/L 104 106 109  CO2 20 - 29 mmol/L 27 27 22   Calcium 8.7 - 10.3 mg/dL 10.1 9.5 9.2  Total Protein 6.0 - 8.5 g/dL 6.8 - -  Total Bilirubin 0.0 - 1.2 mg/dL 1.0 - -  Alkaline Phos 39 - 117 IU/L 101 - -  AST 0 - 40 IU/L 20 - -  ALT 0 - 32 IU/L 16 - -   CBC Latest Ref Rng & Units 01/17/2019 09/28/2018 09/27/2018  WBC 3.4 - 10.8 x10E3/uL 5.3 6.1 7.8  Hemoglobin 11.1 - 15.9 g/dL 13.7 12.2 13.9  Hematocrit 34.0 - 46.6 % 40.2 37.0 42.3  Platelets 150 - 450 x10E3/uL 260 257 343    Lipid Panel     Component Value Date/Time   CHOL 171 01/16/2019 0959   TRIG 218 (H) 01/16/2019 0959   HDL 49 01/16/2019 0959   CHOLHDL 4.2 07/20/2016 0514   VLDL 44 (H) 07/20/2016 0514   LDLCALC 86 01/16/2019 0959   HEMOGLOBIN A1C Lab Results  Component Value Date    HGBA1C 5.4 07/20/2016   MPG 108 07/20/2016   TSH No results for input(s): TSH in the last 8760 hours. Medications   Current Outpatient Medications on File Prior to Visit  Medication Sig Dispense Refill  . Ascorbic Acid (VITAMIN C GUMMIES PO) Take by mouth  daily.    Marland Kitchen BLACK ELDERBERRY,BERRY-FLOWER, PO Take by mouth. Juice, 1 table spoon daily    . Calcium Citrate (CITRACAL PO) Take 2 tablets daily by mouth.     . Cyanocobalamin (B-12 PO) Take 1 tablet by mouth daily.    Marland Kitchen ELIQUIS 5 MG TABS tablet TAKE 1 TABLET BY MOUTH TWICE DAILY 180 tablet 1  . ezetimibe (ZETIA) 10 MG tablet TAKE 1 TABLET BY MOUTH DAILY 90 tablet 2  . latanoprost (XALATAN) 0.005 % ophthalmic solution Place 1 drop into both eyes at bedtime.    . milk thistle 175 MG tablet Take 175 mg by mouth daily.    . Multiple Vitamins-Minerals (PRESERVISION AREDS 2 PO) Take by mouth daily.    . NON FORMULARY Kyolic Garlic extract capsules; Take 2 capsules by mouth two times a day    . NON FORMULARY Juice Plus Omega Oil Capsules: Take 2 capsules by mouth once a day    . Probiotic Product (PROBIOTIC-10 PO) Take by mouth.    . simvastatin (ZOCOR) 20 MG tablet Take 20 mg by mouth at bedtime.    Marland Kitchen UNABLE TO FIND Juice Plus shakes: Drink 1 shake by mouth once a day     No current facility-administered medications on file prior to visit.     Cardiac Studies:   Echocardiogram 11/17/2018: Normal LV systolic function with EF 69%. Left ventricle cavity is normal in size. Moderate concentric hypertrophy of the left ventricle. Normal global wall motion. Doppler evidence of grade I (impaired) diastolic dysfunction, normal LAP. Calculated EF 69%. Trileaflet aortic valve. Mild to moderate aortic regurgitation. Mild (Grade I) mitral regurgitation. Mild to moderate tricuspid regurgitation. Estimated pulmonary artery systolic pressure is 28 mmHg. Mild increase in severity of valvular regurgitation since last study in 2015.    [place]  Stress Test 11/07/2018: The resting electrocardiogram demonstrated normal sinus rhythm and normal resting conduction.  Poor R wave progression. Stress EKG is non diagnostic for ischemia as it is a pharmacologic stress using Lexiscan. Perfusion imaging study demonstrates mild apical thinning but no evidence of ischemia or infarct.  LVEF is normal at 79%. Low risk study.  EKG:  EKG 03/25/2020: Normal sinus rhythm at rate of 67 bpm, left atrial enlargement, normal axis.  Poor R wave progression, cannot exclude anteroseptal infarct old.  Single PAC.  No significant change from 10/10/2018.  EKG 09/27/2018: Atrial fibrillation with rapid ventricular response at the rate of 149 bpm, inferior infarct old, poor R-wave progression, low voltage complexes.  Pulmonary disease pattern.  Assessment     ICD-10-CM   1. Paroxysmal atrial fibrillation (HCC)  I48.0 EKG 12-Lead  2. Essential hypertension  I10 CBC    Basic metabolic panel    lisinopril (ZESTRIL) 20 MG tablet  3. History of cerebrovascular accident with residual deficit  I69.30   4. Mixed hyperlipidemia  E78.2 Lipid Panel With LDL/HDL Ratio   CHA2DS2-VASc Score is 6.  Yearly risk of stroke: 9.8% (F, A, HTN, CVA).  Score of 1=0.6; 2=2.2; 3=3.2; 4=4.8; 5=7.2; 6=9.8; 7=>9.8) -(CHF; HTN; vasc disease DM,  Female = 1; Age <65 =0; 65-74 = 1,  >75 =2; stroke/embolism= 2).   Meds ordered this encounter  Medications  . lisinopril (ZESTRIL) 20 MG tablet    Sig: Take 1 tablet (20 mg total) by mouth daily.    Dispense:  90 tablet    Refill:  3   Medications Discontinued During This Encounter  Medication Reason  . albuterol (VENTOLIN HFA) 108 (90  Base) MCG/ACT inhaler Patient Preference  . lisinopril (ZESTRIL) 10 MG tablet Reorder      Recommendations:   Catherine [name]  is a  74 y.o. female  with paroxysmal atrial fibrillation, hyperlipidemia, hypertension, history of prior stroke in 2018 with residual left sided weakness and smell and taste being  altered but mostly resolved, stroke felt to be due to atherosclerotic disease, and history of statin intolerance with inability to take greater than 20 mg of simvastatin, on her last visit added Zetia which she is tolerating and underwent labs and presents for f/u.   She is presently doing well, states that her blood pressure has been well controlled at home.  However the blood pressure is elevated today on rechecking as well, advised her to increase her lisinopril to 20 mg daily.  With regard to hyper lipidemia, will obtain lipid profile testing, she has made lifestyle changes with regard to elevated triglycerides and has reduced her carbohydrate intake.  Otherwise she maintains sinus rhythm, I will see her back on annual basis.  She will update me regarding blood pressure, goal blood pressure 130/80 mmHg or less.  Routine labs for anticoagulation surveillance will be performed today.  I will send a copy to her PCP.    Adrian Prows, MD, [facility] 03/25/2020, 12:20 PM Office: [phone] Pager: [phone]

## 2020-03-26 LAB — LIPID PANEL WITH LDL/HDL RATIO
Cholesterol, Total: 180 mg/dL (ref 100–199)
HDL: 51 mg/dL (ref >39)
LDL Chol Calc (NIH): 91 mg/dL (ref 0–99)
LDL/HDL Ratio: 1.8 ratio (ref 0.0–3.2)
Triglycerides: 227 mg/dL — ABNORMAL HIGH (ref 0–149)
VLDL Cholesterol Cal: 38 mg/dL (ref 5–40)

## 2020-03-26 LAB — CBC
Hematocrit: 40.4 % (ref 34.0–46.6)
Hemoglobin: 13.8 g/dL (ref 11.1–15.9)
MCH: 30.3 pg (ref 26.6–33.0)
MCHC: 34.2 g/dL (ref 31.5–35.7)
MCV: 89 fL (ref 79–97)
Platelets: 270 10*3/uL (ref 150–450)
RBC: 4.56 x10E6/uL (ref 3.77–5.28)
RDW: 12.9 % (ref 11.7–15.4)
WBC: 5 10*3/uL (ref 3.4–10.8)

## 2020-03-26 LAB — BASIC METABOLIC PANEL
BUN/Creatinine Ratio: 16 (ref 12–28)
BUN: 12 mg/dL (ref 8–27)
CO2: 26 mmol/L (ref 20–29)
Calcium: 9.6 mg/dL (ref 8.7–10.3)
Chloride: 101 mmol/L (ref 96–106)
Creatinine, Ser: 0.75 mg/dL (ref 0.57–1.00)
GFR calc Af Amer: 90 mL/min/{1.73_m2} (ref >59)
GFR calc non Af Amer: 78 mL/min/{1.73_m2} (ref >59)
Glucose: 93 mg/dL (ref 65–99)
Potassium: 4.6 mmol/L (ref 3.5–5.2)
Sodium: 141 mmol/L (ref 134–144)

## 2020-03-26 NOTE — Progress Notes (Signed)
Normal CBC, normal renal function, mild elevation in triglycerides but otherwise normal LDL.  Elevated triglycerides can be controlled by her diet changes and reduction in starch intake.  Will forward a copy to PCP.

## 2020-04-06 ENCOUNTER — Other Ambulatory Visit: Payer: Medicare Other

## 2020-04-06 DIAGNOSIS — Z20822 Contact with and (suspected) exposure to covid-19: Secondary | ICD-10-CM

## 2020-04-08 LAB — NOVEL CORONAVIRUS, NAA: SARS-CoV-2, NAA: NOT DETECTED

## 2020-04-08 LAB — SARS-COV-2, NAA 2 DAY TAT

## 2020-04-22 ENCOUNTER — Other Ambulatory Visit: Payer: Self-pay | Admitting: Cardiology

## 2020-04-22 DIAGNOSIS — I480 Paroxysmal atrial fibrillation: Secondary | ICD-10-CM

## 2020-04-22 DIAGNOSIS — E782 Mixed hyperlipidemia: Secondary | ICD-10-CM

## 2020-07-30 NOTE — Progress Notes (Signed)
Patient is aware 

## 2020-07-30 NOTE — Progress Notes (Signed)
Please advise patient her kidney function is stable.  However her triglycerides have improved but are still high, recommend avoiding carbohydrates and sugars.  Triglycerides are primarily driven by diet, therefore recommend additional diet modifications.

## 2020-10-31 ENCOUNTER — Other Ambulatory Visit: Payer: Self-pay | Admitting: Cardiology

## 2020-10-31 DIAGNOSIS — I480 Paroxysmal atrial fibrillation: Secondary | ICD-10-CM

## 2021-03-24 ENCOUNTER — Other Ambulatory Visit: Payer: Self-pay | Admitting: Cardiology

## 2021-03-24 ENCOUNTER — Other Ambulatory Visit: Payer: Self-pay

## 2021-03-24 ENCOUNTER — Encounter: Payer: Self-pay | Admitting: Cardiology

## 2021-03-24 ENCOUNTER — Ambulatory Visit: Payer: Medicare Other | Admitting: Cardiology

## 2021-03-24 VITALS — BP 148/62 | HR 76 | Temp 98.6°F | Resp 16 | Ht 63.0 in | Wt 171.2 lb

## 2021-03-24 DIAGNOSIS — I10 Essential (primary) hypertension: Secondary | ICD-10-CM

## 2021-03-24 DIAGNOSIS — E782 Mixed hyperlipidemia: Secondary | ICD-10-CM

## 2021-03-24 DIAGNOSIS — I480 Paroxysmal atrial fibrillation: Secondary | ICD-10-CM

## 2021-03-24 MED ORDER — METOPROLOL SUCCINATE ER 50 MG PO TB24: 50.0000 mg | ORAL_TABLET | Freq: Every day | ORAL | 2 refills | Status: DC

## 2021-03-24 NOTE — Progress Notes (Signed)
EKG 03/24/2021: Normal sinus rhythm with rate of 70 bpm, left atrial enlargement, cannot exclude inferior infarct old, anterolateral infarct old.  No evidence of ischemia, normal QT interval.  No significant change from 03/25/2020.

## 2021-03-24 NOTE — Progress Notes (Signed)
Primary Physician/Referring:  Ferd Hibbs, NP  [name], female    DOB: [birth year], 75 y.o.   MRN: [id number]  Chief Complaint  Patient presents with   Atrial Fibrillation   Hyperlipidemia   Follow-up    1 year   HPI: Catherine [name]  is a 75 y.o. female  with paroxysmal atrial fibrillation, hyperlipidemia, hypertension, history of prior stroke in 2016-07-08 with residual left sided weakness and smell and taste being altered, stroke felt to be due to atherosclerotic disease, and history of statin intolerance with inability to take greater than 20 mg of simvastatin, presently on Zetia.  This is her annual visit, she has no specific complaints except she has noticed elevated blood pressure.  Past Medical History:  Diagnosis Date   Arthritis    Cataract    GERD (gastroesophageal reflux disease)    Glaucoma    Hyperlipidemia    Hypertension    Stroke ([place])    TIA (transient ischemic attack)     Past Surgical History:  Procedure Laterality Date   EYE SURGERY     KNEE SURGERY Right    complete   TONSILLECTOMY      Social History   Socioeconomic History   Marital status: Widowed    Spouse name: Not on file   Number of children: 3   Years of education: College   Highest education level: Not on file  Occupational History   Occupation: Retired  Tobacco Use   Smoking status: Never   Smokeless tobacco: Never  Vaping Use   Vaping Use: Not on file  Substance and Sexual Activity   Alcohol use: No   Drug use: No   Sexual activity: Yes  Other Topics Concern   Not on file  Social History Narrative   Husband passed away in 07/08/13.   Now lives w/ her daughter.   Social Determinants of Health   Financial Resource Strain: Not on file  Food Insecurity: Not on file  Transportation Needs: Not on file  Physical Activity: Not on file  Stress: Not on file  Social Connections: Not on file  Intimate Partner Violence: Not on file   Review of Systems   Cardiovascular:  Negative for chest pain, dyspnea on exertion and leg swelling.  Musculoskeletal:  Positive for arthritis.  Gastrointestinal:  Negative for melena.  Neurological:  Positive for focal weakness (left sided, minimal).  Objective   Vitals with BMI 03/24/2021 03/25/2020 03/25/2020  Height 5' 3"  - 5' 3"   Weight 171 lbs 3 oz - 169 lbs 13 oz  BMI 81.01 - 75.10  Systolic 258 527 782  Diastolic 62 71 74  Pulse 76 72 74    Physical Exam Neck:     Vascular: No carotid bruit or JVD.  Cardiovascular:     Rate and Rhythm: Normal rate and regular rhythm.     Pulses: Intact distal pulses.     Heart sounds: Normal heart sounds. No murmur heard.   No gallop.  Pulmonary:     Effort: Pulmonary effort is normal.     Breath sounds: Normal breath sounds.  Abdominal:     General: Bowel sounds are normal.     Palpations: Abdomen is soft.  Musculoskeletal:        General: No swelling.   Radiology: No results found.  Laboratory examination:   CMP Latest Ref Rng & Units 03/25/2020 01/16/2019 09/28/2018  Glucose 65 - 99 mg/dL 93 101(H) 104(H)  BUN 8 - 27  mg/dL 12 11 10   Creatinine 0.57 - 1.00 mg/dL 0.75 0.80 0.86  Sodium 134 - 144 mmol/L 141 144 142  Potassium 3.5 - 5.2 mmol/L 4.6 5.1 4.2  Chloride 96 - 106 mmol/L 101 104 106  CO2 20 - 29 mmol/L 26 27 27   Calcium 8.7 - 10.3 mg/dL 9.6 10.1 9.5  Total Protein 6.0 - 8.5 g/dL - 6.8 -  Total Bilirubin 0.0 - 1.2 mg/dL - 1.0 -  Alkaline Phos 39 - 117 IU/L - 101 -  AST 0 - 40 IU/L - 20 -  ALT 0 - 32 IU/L - 16 -   CBC Latest Ref Rng & Units 03/24/2021 03/25/2020 01/17/2019  WBC 3.4 - 10.8 x10E3/uL 5.4 5.0 5.3  Hemoglobin 11.1 - 15.9 g/dL 13.5 13.8 13.7  Hematocrit 34.0 - 46.6 % 40.9 40.4 40.2  Platelets 150 - 450 x10E3/uL 273 270 260   Lipid Panel Recent Labs    03/24/21 0952  CHOL 172  TRIG 213*  LDLCALC 86  HDL 50     Lipid Panel     Component Value Date/Time   CHOL 172 03/24/2021 0952   TRIG 213 (H) 03/24/2021 0952    HDL 50 03/24/2021 0952   CHOLHDL 4.2 07/20/2016 0514   VLDL 44 (H) 07/20/2016 0514   LDLCALC 86 03/24/2021 0952   HEMOGLOBIN A1C Lab Results  Component Value Date   HGBA1C 5.4 07/20/2016   MPG 108 07/20/2016   TSH Recent Labs    03/24/21 0952  TSH 2.140    External labs:  External labs 07/24/2020:  Hemoglobin 13.8, hematocrit 41.2, MCV 90, platelets 259 Glucose 88, BUN 12, creatinine 0.77, GFR 80, sodium 145, potassium 4.5, alk phos 81, AST 24, ALT 24 Total cholesterol 182, triglycerides 197, HDL 52, LDL 96 Medications   Current Outpatient Medications on File Prior to Visit  Medication Sig Dispense Refill   Ascorbic Acid (VITAMIN C GUMMIES PO) Take by mouth daily.     BLACK ELDERBERRY,BERRY-FLOWER, PO Take by mouth. Juice, 1 table spoon daily     Calcium Citrate (CITRACAL PO) Take 2 tablets daily by mouth.      Cyanocobalamin (B-12 PO) Take 1 tablet by mouth daily.     ELIQUIS 5 MG TABS tablet TAKE 1 TABLET BY MOUTH TWICE DAILY 180 tablet 1   ezetimibe (ZETIA) 10 MG tablet TAKE 1 TABLET BY MOUTH DAILY 90 tablet 2   latanoprost (XALATAN) 0.005 % ophthalmic solution Place 1 drop into both eyes at bedtime.     milk thistle 175 MG tablet Take 175 mg by mouth daily.     Multiple Vitamins-Minerals (PRESERVISION AREDS 2 PO) Take by mouth daily.     NON FORMULARY Kyolic Garlic extract capsules; Take 2 capsules by mouth two times a day     NON FORMULARY Juice Plus Omega Oil Capsules: Take 2 capsules by mouth once a day     Probiotic Product (PROBIOTIC-10 PO) Take by mouth.     simvastatin (ZOCOR) 20 MG tablet Take 20 mg by mouth at bedtime.     UNABLE TO FIND Juice Plus shakes: Drink 1 shake by mouth once a day     No current facility-administered medications on file prior to visit.     Cardiac Studies:   Echocardiogram 11/17/2018: Normal LV systolic function with EF 69%. Left ventricle cavity is normal in size. Moderate concentric hypertrophy of the left ventricle. Normal  global wall motion. Doppler evidence of grade I (impaired) diastolic dysfunction, normal LAP.  Calculated EF 69%. Trileaflet aortic valve. Mild to moderate aortic regurgitation. Mild (Grade I) mitral regurgitation. Mild to moderate tricuspid regurgitation. Estimated pulmonary artery systolic pressure is 28 mmHg. Mild increase in severity of valvular regurgitation since last study in 2015.    [place] Stress Test 11/07/2018: The resting electrocardiogram demonstrated normal sinus rhythm and normal resting conduction.  Poor R wave progression. Stress EKG is non diagnostic for ischemia as it is a pharmacologic stress using Lexiscan. Perfusion imaging study demonstrates mild apical thinning but no evidence of ischemia or infarct.  LVEF is normal at 79%. Low risk study.  EKG:  EKG 03/24/2021: Normal sinus rhythm with rate of 70 bpm, left atrial enlargement, cannot exclude inferior infarct old, anterolateral infarct old.  No evidence of ischemia, normal QT interval.  No significant change from 03/25/2020.  EKG 09/27/2018: Atrial fibrillation with rapid ventricular response at the rate of 149 bpm, inferior infarct old, poor R-wave progression, low voltage complexes.  Pulmonary disease pattern.  Assessment     ICD-10-CM   1. Paroxysmal atrial fibrillation (HCC)  I48.0 EKG 12-Lead    CBC    2. Essential hypertension  I10 TSH    metoprolol succinate (TOPROL-XL) 50 MG 24 hr tablet    3. Mixed hyperlipidemia  E78.2 Lipid Panel With LDL/HDL Ratio    fenofibrate (TRICOR) 48 MG tablet    Lipid Panel With LDL/HDL Ratio     CHA2DS2-VASc Score is 6.  Yearly risk of stroke: 9.8% (F, A, HTN, CVA).  Score of 1=0.6; 2=2.2; 3=3.2; 4=4.8; 5=7.2; 6=9.8; 7=>9.8) -(CHF; HTN; vasc disease DM,  Female = 1; Age <65 =0; 65-74 = 1,  >75 =2; stroke/embolism= 2).   Meds ordered this encounter  Medications   metoprolol succinate (TOPROL-XL) 50 MG 24 hr tablet    Sig: Take 1 tablet (50 mg total) by mouth  daily. Take with or immediately following a meal.    Dispense:  30 tablet    Refill:  2   fenofibrate (TRICOR) 48 MG tablet    Sig: Take 1 tablet (48 mg total) by mouth daily after supper.    Dispense:  30 tablet    Refill:  2    There are no discontinued medications.    Recommendations:   Catherine [name]  is a  75 y.o. female  with paroxysmal atrial fibrillation, hyperlipidemia, hypertension, history of prior stroke in 2018 with residual left sided weakness and smell and taste being altered but mostly resolved, stroke felt to be due to atherosclerotic disease and history of statin intolerance with inability to take greater than 20 mg of simvastatin + Zetia which she is tolerating.  With regard to hyperlipidemia, will obtain lipid profile testing, she has made lifestyle changes with regard to elevated triglycerides and has reduced her carbohydrate intake.  The triglycerides are still elevated, will add Tricor 48 mg daily.    Blood pressure is elevated today, I will add metoprolol succinate 50 mg daily, would like to see her back in 2 months for follow-up of atrial fibrillation and also hypertension and hyperlipidemia, will repeat EKG upon repeat visit to exclude any high degree AV block with beta-blocker therapy in view of her advanced age. Otherwise she maintains sinus rhythm. Routine labs for anticoagulation surveillance will be performed today.  I will send a copy to her PCP.   Labs reviewed, TSH is normal, lipids reveal elevated triglycerides, I will try Trilipix 48 mg daily.  We will recheck lipids in 6 weeks.  We could also consider Vascepa.   Adrian Prows, MD, [facility] 03/25/2021, 1:55 PM Office: [phone] Pager: [phone]

## 2021-03-25 ENCOUNTER — Encounter: Payer: Self-pay | Admitting: Cardiology

## 2021-03-25 LAB — LIPID PANEL WITH LDL/HDL RATIO
Cholesterol, Total: 172 mg/dL (ref 100–199)
HDL: 50 mg/dL (ref >39)
LDL Chol Calc (NIH): 86 mg/dL (ref 0–99)
LDL/HDL Ratio: 1.7 ratio (ref 0.0–3.2)
Triglycerides: 213 mg/dL — ABNORMAL HIGH (ref 0–149)
VLDL Cholesterol Cal: 36 mg/dL (ref 5–40)

## 2021-03-25 LAB — CBC
Hematocrit: 40.9 % (ref 34.0–46.6)
Hemoglobin: 13.5 g/dL (ref 11.1–15.9)
MCH: 29.6 pg (ref 26.6–33.0)
MCHC: 33 g/dL (ref 31.5–35.7)
MCV: 90 fL (ref 79–97)
Platelets: 273 10*3/uL (ref 150–450)
RBC: 4.56 x10E6/uL (ref 3.77–5.28)
RDW: 12.7 % (ref 11.7–15.4)
WBC: 5.4 10*3/uL (ref 3.4–10.8)

## 2021-03-25 LAB — TSH: TSH: 2.14 u[IU]/mL (ref 0.450–4.500)

## 2021-03-25 MED ORDER — FENOFIBRATE 48 MG PO TABS: 48.0000 mg | ORAL_TABLET | Freq: Every day | ORAL | 2 refills | Status: DC

## 2021-04-23 ENCOUNTER — Other Ambulatory Visit: Payer: Self-pay | Admitting: Cardiology

## 2021-04-23 DIAGNOSIS — I480 Paroxysmal atrial fibrillation: Secondary | ICD-10-CM

## 2021-05-26 ENCOUNTER — Ambulatory Visit: Payer: Medicare Other | Admitting: Cardiology

## 2021-06-12 ENCOUNTER — Ambulatory Visit: Payer: Medicare Other | Admitting: Cardiology

## 2021-06-12 ENCOUNTER — Encounter: Payer: Self-pay | Admitting: Cardiology

## 2021-06-12 ENCOUNTER — Other Ambulatory Visit: Payer: Self-pay

## 2021-06-12 VITALS — BP 178/64 | HR 60 | Temp 97.5°F | Resp 16 | Ht 63.0 in | Wt 170.2 lb

## 2021-06-12 DIAGNOSIS — E782 Mixed hyperlipidemia: Secondary | ICD-10-CM

## 2021-06-12 DIAGNOSIS — I10 Essential (primary) hypertension: Secondary | ICD-10-CM

## 2021-06-12 DIAGNOSIS — I480 Paroxysmal atrial fibrillation: Secondary | ICD-10-CM

## 2021-06-12 MED ORDER — OLMESARTAN MEDOXOMIL-HCTZ 40-12.5 MG PO TABS: 1.0000 | ORAL_TABLET | ORAL | 2 refills | Status: DC

## 2021-06-12 NOTE — Progress Notes (Signed)
Primary Physician/Referring:  Ferd Hibbs, NP  Patient ID: [name], female    DOB: Oct 15, 1945, 76 y.o.   MRN: [id number]  Chief Complaint  Patient presents with   Hypertension   Hyperlipidemia   Follow-up    2 months   HPI: FAITH PATRICELLI  is a 76 y.o. female  female  with paroxysmal atrial fibrillation, hyperlipidemia, hypertension, history of prior stroke in July 16, 2016 with residual left sided weakness and smell and taste being altered but mostly resolved, stroke felt to be due to atherosclerotic disease and history of statin intolerance with inability to take greater than 20 mg of simvastatin + Zetia + fenofibrate 48 mg which she is tolerating.  This is her 88-monthoffice visit, she has no specific complaints except she has noticed elevated blood pressure.  Past Medical History:  Diagnosis Date   Arthritis    Cataract    GERD (gastroesophageal reflux disease)    Glaucoma    Hyperlipidemia    Hypertension    [place]    TIA (transient ischemic attack)     Past Surgical History:  Procedure Laterality Date   EYE SURGERY     KNEE SURGERY Right    complete   TONSILLECTOMY      Social History   Socioeconomic History   Marital status: Widowed    Spouse name: Not on file   Number of children: 3   Years of education: College   Highest education level: Not on file  Occupational History   Occupation: Retired  Tobacco Use   Smoking status: Never   Smokeless tobacco: Never  Vaping Use   Vaping Use: Not on file  Substance and Sexual Activity   Alcohol use: No   Drug use: No   Sexual activity: Yes  Other Topics Concern   Not on file  Social History Narrative   Husband passed away in 203/22/15   Now lives w/ her daughter.   Social Determinants of Health   Financial Resource Strain: Not on file  Food Insecurity: Not on file  Transportation Needs: Not on file  Physical Activity: Not on file  Stress: Not on file  Social  Connections: Not on file  Intimate Partner Violence: Not on file   Review of Systems  Cardiovascular:  Negative for chest pain, dyspnea on exertion and leg swelling.  Gastrointestinal:  Negative for melena.  Neurological:  Positive for focal weakness (left sided, minimal).  Objective   Vitals with BMI 06/12/2021 06/12/2021 03/24/2021  Height - 5' 3"  5' 3"   Weight - 170 lbs 3 oz 171 lbs 3 oz  BMI - 378.24323.53 Systolic [id number] Diastolic 64 80 62  Pulse 60 61 76     Physical Exam Neck:     Vascular: No carotid bruit or JVD.  Cardiovascular:     Rate and Rhythm: Normal rate and regular rhythm.     Pulses: Intact distal pulses.     Heart sounds: Normal heart sounds. No murmur heard.   No gallop.  Pulmonary:     Effort: Pulmonary effort is normal.     Breath sounds: Normal breath sounds.  Abdominal:     General: Bowel sounds are normal.     Palpations: Abdomen is soft.  Musculoskeletal:        General: No swelling.   Radiology: No results found.  Laboratory examination:   CMP Latest Ref Rng & Units 03/25/2020 01/16/2019 09/28/2018  Glucose 65 - 99 mg/dL 93  101(H) 104(H)  BUN 8 - 27 mg/dL 12 11 10   Creatinine 0.57 - 1.00 mg/dL 0.75 0.80 0.86  Sodium 134 - 144 mmol/L 141 144 142  Potassium 3.5 - 5.2 mmol/L 4.6 5.1 4.2  Chloride 96 - 106 mmol/L 101 104 106  CO2 20 - 29 mmol/L 26 27 27   Calcium 8.7 - 10.3 mg/dL 9.6 10.1 9.5  Total Protein 6.0 - 8.5 g/dL - 6.8 -  Total Bilirubin 0.0 - 1.2 mg/dL - 1.0 -  Alkaline Phos 39 - 117 IU/L - 101 -  AST 0 - 40 IU/L - 20 -  ALT 0 - 32 IU/L - 16 -   CBC Latest Ref Rng & Units 03/24/2021 03/25/2020 01/17/2019  WBC 3.4 - 10.8 x10E3/uL 5.4 5.0 5.3  Hemoglobin 11.1 - 15.9 g/dL 13.5 13.8 13.7  Hematocrit 34.0 - 46.6 % 40.9 40.4 40.2  Platelets 150 - 450 x10E3/uL 273 270 260   Lipid Panel Recent Labs    03/24/21 0952  CHOL 172  TRIG 213*  LDLCALC 86  HDL 50     HEMOGLOBIN A1C Lab Results  Component Value Date   HGBA1C  5.4 07/20/2016   MPG 108 07/20/2016   TSH Recent Labs    03/24/21 0952  TSH 2.140     External labs:  External labs 07/24/2020:  Hemoglobin 13.8, hematocrit 41.2, MCV 90, platelets 259 Glucose 88, BUN 12, creatinine 0.77, GFR 80, sodium 145, potassium 4.5, alk phos 81, AST 24, ALT 24 Total cholesterol 182, triglycerides 197, HDL 52, LDL 96 Medications    Current Outpatient Medications:    Ascorbic Acid (VITAMIN C GUMMIES PO), Take by mouth daily., Disp: , Rfl:    BLACK ELDERBERRY,BERRY-FLOWER, PO, Take by mouth. Juice, 1 table spoon daily, Disp: , Rfl:    Calcium Citrate (CITRACAL PO), Take 2 tablets daily by mouth. , Disp: , Rfl:    Cyanocobalamin (B-12 PO), Take 1 tablet by mouth daily., Disp: , Rfl:    ELIQUIS 5 MG TABS tablet, TAKE 1 TABLET BY MOUTH TWICE DAILY, Disp: 180 tablet, Rfl: 1   ezetimibe (ZETIA) 10 MG tablet, TAKE 1 TABLET BY MOUTH DAILY, Disp: 90 tablet, Rfl: 2   fenofibrate (TRICOR) 48 MG tablet, Take 1 tablet (48 mg total) by mouth daily after supper., Disp: 30 tablet, Rfl: 2   latanoprost (XALATAN) 0.005 % ophthalmic solution, Place 1 drop into both eyes at bedtime., Disp: , Rfl:    lisinopril (ZESTRIL) 20 MG tablet, TAKE 1 TABLET BY MOUTH DAILY, Disp: 90 tablet, Rfl: 3   metoprolol succinate (TOPROL-XL) 50 MG 24 hr tablet, Take 1 tablet (50 mg total) by mouth daily. Take with or immediately following a meal., Disp: 30 tablet, Rfl: 2   milk thistle 175 MG tablet, Take 175 mg by mouth daily., Disp: , Rfl:    Multiple Vitamins-Minerals (PRESERVISION AREDS 2 PO), Take by mouth daily., Disp: , Rfl:    NON FORMULARY, Kyolic Garlic extract capsules; Take 2 capsules by mouth two times a day, Disp: , Rfl:    NON FORMULARY, Juice Plus Omega Oil Capsules: Take 2 capsules by mouth once a day, Disp: , Rfl:    Probiotic Product (PROBIOTIC-10 PO), Take by mouth., Disp: , Rfl:    simvastatin (ZOCOR) 20 MG tablet, Take 20 mg by mouth at bedtime., Disp: , Rfl:     UNABLE TO FIND, Juice Plus shakes: Drink 1 shake by mouth once a day, Disp: , Rfl:     Cardiac Studies:  Echocardiogram 11/17/2018: Normal LV systolic function with EF 69%. Left ventricle cavity is normal in size. Moderate concentric hypertrophy of the left ventricle. Normal global wall motion. Doppler evidence of grade I (impaired) diastolic dysfunction, normal LAP. Calculated EF 69%. Trileaflet aortic valve. Mild to moderate aortic regurgitation. Mild (Grade I) mitral regurgitation. Mild to moderate tricuspid regurgitation. Estimated pulmonary artery systolic pressure is 28 mmHg. Mild increase in severity of valvular regurgitation since last study in 2015.    [place] Stress Test 11/07/2018: The resting electrocardiogram demonstrated normal sinus rhythm and normal resting conduction.  Poor R wave progression. Stress EKG is non diagnostic for ischemia as it is a pharmacologic stress using Lexiscan. Perfusion imaging study demonstrates mild apical thinning but no evidence of ischemia or infarct.  LVEF is normal at 79%. Low risk study.  EKG:  EKG 06/12/2021: Normal sinus rhythm without 59 bpm, left atrial enlargement, normal axis, poor R wave progression, cannot exclude anteroseptal infarct old.  No significant change from 03/24/2021.  EKG 09/27/2018: Atrial fibrillation with rapid ventricular response at the rate of 149 bpm, inferior infarct old, poor R-wave progression, low voltage complexes.  Pulmonary disease pattern.  Assessment     ICD-10-CM   1. Paroxysmal atrial fibrillation (HCC)  I48.0 EKG 12-Lead    2. Primary hypertension  I10     3. Mixed hyperlipidemia  E78.2      CHA2DS2-VASc Score is 6.  Yearly risk of stroke: 9.8% (F, A, HTN, CVA).  Score of 1=0.6; 2=2.2; 3=3.2; 4=4.8; 5=7.2; 6=9.8; 7=>9.8) -(CHF; HTN; vasc disease DM,  Female = 1; Age <65 =0; 65-74 = 1,  >75 =2; stroke/embolism= 2).   No orders of the defined types were placed in this  encounter.   There are no discontinued medications.    Recommendations:   ALISSE TUITE  is a  76 y.o. female  with paroxysmal atrial fibrillation, hyperlipidemia, hypertension, history of prior stroke in 2018 with residual left sided weakness and smell and taste being altered but mostly resolved, stroke felt to be due to atherosclerotic disease and history of statin intolerance with inability to take greater than 20 mg of simvastatin + Zetia + fenofibrate 48 mg which she is tolerating.  With regard to hyperlipidemia, will obtain lipid profile testing, she has made lifestyle changes with regard to elevated triglycerides and has reduced her carbohydrate intake and is trying her best to avoid ice tea.  We will check lipids.  She will be a good candidate for prevail-ASCVD trial  Blood pressure is elevated today, will discontinue lisinopril and switch her to olmesartan HCT 40/12.5 mg in the morning.  We will check CBC and CMP 2 weeks from now.  I would like to see her back in 6 weeks for follow-up of hypertension and hyperlipidemia specifically.  With regard to atrial fibrillation, she is maintaining sinus rhythm and tolerating anticoagulation without bleeding diathesis.    Adrian Prows, MD, [facility] 06/12/2021, 8:55 AM Office: [phone] Pager: [phone]

## 2021-06-20 ENCOUNTER — Other Ambulatory Visit: Payer: Self-pay | Admitting: Cardiology

## 2021-06-20 DIAGNOSIS — I10 Essential (primary) hypertension: Secondary | ICD-10-CM

## 2021-06-26 LAB — LIPID PANEL WITH LDL/HDL RATIO
Cholesterol, Total: 165 mg/dL (ref 100–199)
HDL: 56 mg/dL (ref >39)
LDL Chol Calc (NIH): 80 mg/dL (ref 0–99)
LDL/HDL Ratio: 1.4 ratio (ref 0.0–3.2)
Triglycerides: 172 mg/dL — ABNORMAL HIGH (ref 0–149)
VLDL Cholesterol Cal: 29 mg/dL (ref 5–40)

## 2021-06-26 LAB — CMP14+EGFR
ALT: 17 IU/L (ref 0–32)
AST: 23 IU/L (ref 0–40)
Albumin/Globulin Ratio: 2 (ref 1.2–2.2)
Albumin: 4.5 g/dL (ref 3.7–4.7)
Alkaline Phosphatase: 67 IU/L (ref 44–121)
BUN/Creatinine Ratio: 20 (ref 12–28)
BUN: 18 mg/dL (ref 8–27)
Bilirubin Total: 0.6 mg/dL (ref 0.0–1.2)
CO2: 27 mmol/L (ref 20–29)
Calcium: 9.7 mg/dL (ref 8.7–10.3)
Chloride: 101 mmol/L (ref 96–106)
Creatinine, Ser: 0.9 mg/dL (ref 0.57–1.00)
Globulin, Total: 2.2 g/dL (ref 1.5–4.5)
Glucose: 93 mg/dL (ref 70–99)
Potassium: 4.2 mmol/L (ref 3.5–5.2)
Sodium: 141 mmol/L (ref 134–144)
Total Protein: 6.7 g/dL (ref 6.0–8.5)
eGFR: 66 mL/min/{1.73_m2} (ref >59)

## 2021-06-26 LAB — CBC
Hematocrit: 40 % (ref 34.0–46.6)
Hemoglobin: 13.3 g/dL (ref 11.1–15.9)
MCH: 30.1 pg (ref 26.6–33.0)
MCHC: 33.3 g/dL (ref 31.5–35.7)
MCV: 91 fL (ref 79–97)
Platelets: 286 10*3/uL (ref 150–450)
RBC: 4.42 x10E6/uL (ref 3.77–5.28)
RDW: 13 % (ref 11.7–15.4)
WBC: 5.4 10*3/uL (ref 3.4–10.8)

## 2021-07-14 ENCOUNTER — Other Ambulatory Visit: Payer: Self-pay | Admitting: Cardiology

## 2021-07-14 DIAGNOSIS — E782 Mixed hyperlipidemia: Secondary | ICD-10-CM

## 2021-07-24 ENCOUNTER — Encounter: Payer: Self-pay | Admitting: Cardiology

## 2021-07-24 ENCOUNTER — Ambulatory Visit: Payer: Medicare Other | Admitting: Cardiology

## 2021-07-24 VITALS — BP 125/75 | HR 62 | Temp 97.3°F | Resp 16 | Ht 63.0 in | Wt 166.0 lb

## 2021-07-24 DIAGNOSIS — I10 Essential (primary) hypertension: Secondary | ICD-10-CM

## 2021-07-24 DIAGNOSIS — E782 Mixed hyperlipidemia: Secondary | ICD-10-CM

## 2021-07-24 DIAGNOSIS — I480 Paroxysmal atrial fibrillation: Secondary | ICD-10-CM

## 2021-07-24 DIAGNOSIS — I6930 Unspecified sequelae of cerebral infarction: Secondary | ICD-10-CM

## 2021-07-24 MED ORDER — METOPROLOL SUCCINATE ER 50 MG PO TB24: ORAL_TABLET | ORAL | 3 refills | Status: DC

## 2021-07-24 MED ORDER — OLMESARTAN MEDOXOMIL-HCTZ 40-12.5 MG PO TABS: 1.0000 | ORAL_TABLET | ORAL | 3 refills | Status: DC

## 2021-07-24 MED ORDER — APIXABAN 5 MG PO TABS: 5.0000 mg | ORAL_TABLET | Freq: Two times a day (BID) | ORAL | 3 refills | Status: DC

## 2021-07-24 MED ORDER — SIMVASTATIN 20 MG PO TABS: 20.0000 mg | ORAL_TABLET | Freq: Every day | ORAL | 3 refills | Status: DC

## 2021-07-24 NOTE — Progress Notes (Signed)
? ?Primary Physician/Referring:  Ferd Hibbs, NP ? ?[name], female    DOB: 31-Mar-1946, 76 y.o.   MRN: [id number] ? ?No chief complaint on file. ? ?HPI: [name]  is a 76 y.o. female  female  with paroxysmal atrial fibrillation, hyperlipidemia, hypertension, history of prior stroke in 2018 with residual left sided weakness and smell and taste being altered but mostly resolved, stroke felt to be due to atherosclerotic disease and history of statin intolerance with inability to take greater than 20 mg of simvastatin + Zetia + fenofibrate 48 mg which she is tolerating. ? ?On her last office visit due to cough, I discontinued lisinopril and switched her to Benicar HCT, she is tolerating this without significant side effects.  She has not had any further cough.  She is accompanied by her daughter.  ? ?Past Medical History:  ?Diagnosis Date  ? Arthritis   ? Cataract   ? GERD (gastroesophageal reflux disease)   ? Glaucoma   ? Hyperlipidemia   ? Hypertension   ? Stroke [facility])   ? TIA (transient ischemic attack)   ? ? ?Past Surgical History:  ?Procedure Laterality Date  ? EYE SURGERY    ? KNEE SURGERY Right   ? complete  ? TONSILLECTOMY    ? ? ?Social History  ? ?Tobacco Use  ? Smoking status: Never  ? Smokeless tobacco: Never  ?Substance Use Topics  ? Alcohol use: No  ? Widowed. Now lives with daughter.  ? ?Review of Systems  ?Cardiovascular:  Negative for chest pain, dyspnea on exertion and leg swelling.  ?Gastrointestinal:  Negative for melena.  ?Neurological:  Positive for focal weakness (left sided, minimal).  ?Objective  ? ? ?  07/24/2021  ?  9:45 AM 07/24/2021  ?  9:08 AM 06/12/2021  ?  8:52 AM  ?Vitals with BMI  ?Height  _0    ?Weight  166 lbs   ?BMI  29.41   ?Systolic 916 606 004  ?Diastolic 75 55 64  ?Pulse  62 60  ?  ?Physical Exam ?Neck:  ?   Vascular: No carotid bruit or JVD.  ?Cardiovascular:  ?   Rate and Rhythm: Normal rate and regular rhythm.  ?   Pulses: Intact distal pulses.  ?    Heart sounds: Normal heart sounds. No murmur heard. ?  No gallop.  ?Pulmonary:  ?   Effort: Pulmonary effort is normal.  ?   Breath sounds: Normal breath sounds.  ?Abdominal:  ?   General: Bowel sounds are normal.  ?   Palpations: Abdomen is soft.  ?Musculoskeletal:     ?   General: No swelling.  ? ?Radiology: ?No results found. ? ?Laboratory examination:  ? ? ?  Latest Ref Rng & Units 06/25/2021  ?  9:41 AM 03/25/2020  ? 10:29 AM 01/16/2019  ?  9:59 AM  ?CMP  ?Glucose 70 - 99 mg/dL 93   93   101    ?BUN 8 - 27 mg/dL _1 ?Creatinine 0.57 - 1.00 mg/dL 0.90   0.75   0.80    ?Sodium 134 - 144 mmol/L 141   141   144    ?Potassium 3.5 - 5.2 mmol/L 4.2   4.6   5.1    ?Chloride 96 - 106 mmol/L 101   101   104    ?CO2 20 - 29 mmol/L _2 ?  Calcium 8.7 - 10.3 mg/dL 9.7   9.6   10.1    ?Total Protein 6.0 - 8.5 g/dL 6.7    6.8    ?Total Bilirubin 0.0 - 1.2 mg/dL 0.6    1.0    ?Alkaline Phos 44 - 121 IU/L 67    101    ?AST 0 - 40 IU/L 23    20    ?ALT 0 - 32 IU/L 17    16    ? ? ?  Latest Ref Rng & Units 06/25/2021  ?  9:41 AM 03/24/2021  ?  9:52 AM 03/25/2020  ? 10:29 AM  ?CBC  ?WBC 3.4 - 10.8 x10E3/uL 5.4   5.4   5.0    ?Hemoglobin 11.1 - 15.9 g/dL 13.3   13.5   13.8    ?Hematocrit 34.0 - 46.6 % 40.0   40.9   40.4    ?Platelets 150 - 450 x10E3/uL 286   273   270    ? ?Lipid Panel ?Recent Labs  ?  03/24/21 ?0952 06/25/21 ?0941  ?CHOL 172 165  ?TRIG 213* 172*  ?LDLCALC 86 80  ?HDL 50 56  ?  ?HEMOGLOBIN A1C ?Lab Results  ?Component Value Date  ? HGBA1C 5.4 07/20/2016  ? MPG 108 07/20/2016  ? ?TSH ?Recent Labs  ?  03/24/21 ?8016  ?TSH 2.140  ? ?External labs: ? ?External labs 07/24/2020: ? ?Hemoglobin 13.8, hematocrit 41.2, MCV 90, platelets 259 ?Glucose 88, BUN 12, creatinine 0.77, GFR 80, sodium 145, potassium 4.5, alk phos 81, AST 24, ALT 24 ?Total cholesterol 182, triglycerides 197, HDL 52, LDL 96 ?Medications  ? ? ?Current Outpatient Medications:  ?  Ascorbic Acid (VITAMIN C GUMMIES PO), Take by mouth  daily., Disp: , Rfl:  ?  BLACK ELDERBERRY,BERRY-FLOWER, PO, Take by mouth. Juice, 1 table spoon daily, Disp: , Rfl:  ?  Calcium Citrate (CITRACAL PO), Take 2 tablets daily by mouth. , Disp: , Rfl:  ?  Cyanocobalamin (B-12 PO), Take 1 tablet by mouth daily., Disp: , Rfl:  ?  ezetimibe (ZETIA) 10 MG tablet, TAKE 1 TABLET BY MOUTH DAILY, Disp: 90 tablet, Rfl: 2 ?  fenofibrate (TRICOR) 48 MG tablet, TAKE 1 TABLET BY MOUTH DAILY AFTER SUPPER, Disp: 30 tablet, Rfl: 2 ?  latanoprost (XALATAN) 0.005 % ophthalmic solution, Place 1 drop into both eyes at bedtime., Disp: , Rfl:  ?  milk thistle 175 MG tablet, Take 175 mg by mouth daily., Disp: , Rfl:  ?  Multiple Vitamins-Minerals (PRESERVISION AREDS 2 PO), Take by mouth daily., Disp: , Rfl:  ?  NON FORMULARY, Kyolic Garlic extract capsules; Take 2 capsules by mouth two times a day, Disp: , Rfl:  ?  NON FORMULARY, Juice Plus Omega Oil Capsules: Take 2 capsules by mouth once a day, Disp: , Rfl:  ?  Probiotic Product (PROBIOTIC-10 PO), Take by mouth., Disp: , Rfl:  ?  apixaban (ELIQUIS) 5 MG TABS tablet, Take 1 tablet (5 mg total) by mouth 2 (two) times daily., Disp: 200 tablet, Rfl: 3 ?  metoprolol succinate (TOPROL-XL) 50 MG 24 hr tablet, TAKE 1 TABLET BY MOUTH DAILY WITH A MEAL, Disp: 100 tablet, Rfl: 3 ?  olmesartan-hydrochlorothiazide (BENICAR HCT) 40-12.5 MG tablet, Take 1 tablet by mouth every morning., Disp: 100 tablet, Rfl: 3 ?  simvastatin (ZOCOR) 20 MG tablet, Take 1 tablet (20 mg total) by mouth at bedtime., Disp: 100 tablet, Rfl: 3 ?  ?Cardiac Studies:  ? ?Echocardiogram 11/17/2018: ?Normal LV  systolic function with EF 69%. Left ventricle cavity is normal in size. Moderate concentric hypertrophy of the left ventricle. Normal global wall motion. Doppler evidence of grade I (impaired) diastolic dysfunction, normal LAP. Calculated EF 69%. ?Trileaflet aortic valve. Mild to moderate aortic regurgitation. ?Mild (Grade I) mitral regurgitation. ?Mild to moderate tricuspid  regurgitation. Estimated pulmonary artery systolic pressure is 28 mmHg. ?Mild increase in severity of valvular regurgitation since last study in 2015.   ? ?[place] Stress Test 11/07/2018: ?The resting electrocardiogram demonstrated normal sinus rhythm and normal resting conduction.  Poor R wave progression. Stress EKG is non diagnostic for ischemia as it is a pharmacologic stress using Lexiscan. ?Perfusion imaging study demonstrates mild apical thinning but no evidence of ischemia or infarct.  LVEF is normal at 79%. Low risk study. ? ?EKG: ? ?EKG 06/12/2021: Normal sinus rhythm without 59 bpm, left atrial enlargement, normal axis, poor R wave progression, cannot exclude anteroseptal infarct old.  No significant change from 03/24/2021. ? ?EKG 09/27/2018: Atrial fibrillation with rapid ventricular response at the rate of 149 bpm, inferior infarct old, poor R-wave progression, low voltage complexes.  Pulmonary disease pattern. ? ?Assessment  ? ?  ICD-10-CM   ?1. Paroxysmal atrial fibrillation (HCC)  I48.0 apixaban (ELIQUIS) 5 MG TABS tablet  ?  ?2. History of cerebrovascular accident with residual deficit  I69.30   ?  ?3. Primary hypertension  I10 olmesartan-hydrochlorothiazide (BENICAR HCT) 40-12.5 MG tablet  ?  ?4. Mixed hyperlipidemia  E78.2 simvastatin (ZOCOR) 20 MG tablet  ?  ?5. Essential hypertension  I10 metoprolol succinate (TOPROL-XL) 50 MG 24 hr tablet  ?  ?6. Paroxysmal atrial fibrillation (HCC)  I48.0 apixaban (ELIQUIS) 5 MG TABS tablet  ? CHA2DS2-VASc Score is 7.  Yearly risk of stroke: 9.8%.   ?  ? ?CHA2DS2-VASc Score is 6.  Yearly risk of stroke: 9.8% (F, A, HTN, CVA).  Score of 1=0.6; 2=2.2; 3=3.2; 4=4.8; 5=7.2; 6=9.8; 7=>9.8) ?-(CHF; HTN; vasc disease DM,  Female = 1; Age <65 =0; 65-74 = 1,  >75 =2; stroke/embolism= 2).   ?Meds ordered this encounter  ?Medications  ? olmesartan-hydrochlorothiazide (BENICAR HCT) 40-12.5 MG tablet  ?  Sig: Take 1 tablet by mouth every morning.  ?  Dispense:  100  tablet  ?  Refill:  3  ? simvastatin (ZOCOR) 20 MG tablet  ?  Sig: Take 1 tablet (20 mg total) by mouth at bedtime.  ?  Dispense:  100 tablet  ?  Refill:  3  ? metoprolol succinate (TOPROL-XL) 50 MG 24 hr

## 2021-08-06 ENCOUNTER — Other Ambulatory Visit: Payer: Self-pay

## 2021-08-18 ENCOUNTER — Other Ambulatory Visit: Payer: Self-pay

## 2021-08-18 DIAGNOSIS — I10 Essential (primary) hypertension: Secondary | ICD-10-CM

## 2021-08-18 DIAGNOSIS — E782 Mixed hyperlipidemia: Secondary | ICD-10-CM

## 2021-08-18 DIAGNOSIS — I480 Paroxysmal atrial fibrillation: Secondary | ICD-10-CM

## 2021-08-18 MED ORDER — SIMVASTATIN 20 MG PO TABS: 20.0000 mg | ORAL_TABLET | Freq: Every day | ORAL | 3 refills | Status: DC

## 2021-08-18 MED ORDER — APIXABAN 5 MG PO TABS: 5.0000 mg | ORAL_TABLET | Freq: Two times a day (BID) | ORAL | 3 refills | Status: DC

## 2021-08-18 MED ORDER — METOPROLOL SUCCINATE ER 50 MG PO TB24: ORAL_TABLET | ORAL | 3 refills | Status: DC

## 2021-08-18 MED ORDER — OLMESARTAN MEDOXOMIL-HCTZ 40-12.5 MG PO TABS: 1.0000 | ORAL_TABLET | ORAL | 3 refills | Status: DC

## 2021-08-18 MED ORDER — FENOFIBRATE 48 MG PO TABS: 48.0000 mg | ORAL_TABLET | Freq: Every day | ORAL | 3 refills | Status: DC

## 2021-09-05 ENCOUNTER — Other Ambulatory Visit: Payer: Self-pay | Admitting: Cardiology

## 2021-09-05 DIAGNOSIS — I10 Essential (primary) hypertension: Secondary | ICD-10-CM

## 2021-09-30 ENCOUNTER — Other Ambulatory Visit: Payer: Self-pay | Admitting: Radiology

## 2021-10-06 ENCOUNTER — Other Ambulatory Visit: Payer: Self-pay | Admitting: Cardiology

## 2021-10-06 DIAGNOSIS — I10 Essential (primary) hypertension: Secondary | ICD-10-CM

## 2021-10-07 ENCOUNTER — Telehealth: Payer: Self-pay

## 2021-10-07 NOTE — Telephone Encounter (Signed)
Called and spoke to pts pharmacy, they voiced understanding.

## 2021-10-07 NOTE — Telephone Encounter (Signed)
Notes in the allergy list note that patient cannot tolerate more than 20 mg of simvastatin. She should continue to take simvastatin 20 mg as she has tolerated this dose without issue.

## 2021-10-07 NOTE — Telephone Encounter (Signed)
[facility] called regarding pts allergy list. Her lists states that she is allergic to statins, atorvastatin, and simvastatin. Simvastatin is on her medication list and they would like to know how to proceed. Please advise.  Phone Number: [phone] Reference Number: [id number]

## 2021-10-24 ENCOUNTER — Other Ambulatory Visit: Payer: Self-pay | Admitting: Cardiology

## 2021-10-24 DIAGNOSIS — E782 Mixed hyperlipidemia: Secondary | ICD-10-CM

## 2022-02-11 ENCOUNTER — Other Ambulatory Visit: Payer: Self-pay | Admitting: Cardiology

## 2022-02-11 DIAGNOSIS — E782 Mixed hyperlipidemia: Secondary | ICD-10-CM

## 2022-04-22 ENCOUNTER — Other Ambulatory Visit: Payer: Self-pay

## 2022-04-22 DIAGNOSIS — I10 Essential (primary) hypertension: Secondary | ICD-10-CM

## 2022-04-22 MED ORDER — METOPROLOL SUCCINATE ER 50 MG PO TB24: ORAL_TABLET | ORAL | 3 refills | Status: DC

## 2022-06-01 ENCOUNTER — Other Ambulatory Visit: Payer: Self-pay | Admitting: Cardiology

## 2022-06-01 DIAGNOSIS — E782 Mixed hyperlipidemia: Secondary | ICD-10-CM

## 2022-07-16 ENCOUNTER — Other Ambulatory Visit: Payer: Self-pay | Admitting: Cardiology

## 2022-07-16 DIAGNOSIS — E782 Mixed hyperlipidemia: Secondary | ICD-10-CM

## 2022-07-29 ENCOUNTER — Encounter: Payer: Self-pay | Admitting: Cardiology

## 2022-07-29 ENCOUNTER — Ambulatory Visit: Payer: Medicare Other | Admitting: Cardiology

## 2022-07-29 VITALS — BP 120/70 | HR 63 | Resp 16 | Ht 63.0 in | Wt 170.8 lb

## 2022-07-29 DIAGNOSIS — I480 Paroxysmal atrial fibrillation: Secondary | ICD-10-CM

## 2022-07-29 DIAGNOSIS — I6930 Unspecified sequelae of cerebral infarction: Secondary | ICD-10-CM

## 2022-07-29 DIAGNOSIS — E559 Vitamin D deficiency, unspecified: Secondary | ICD-10-CM

## 2022-07-29 DIAGNOSIS — E782 Mixed hyperlipidemia: Secondary | ICD-10-CM

## 2022-07-29 DIAGNOSIS — I10 Essential (primary) hypertension: Secondary | ICD-10-CM

## 2022-07-29 MED ORDER — METOPROLOL SUCCINATE ER 50 MG PO TB24
ORAL_TABLET | ORAL | 3 refills | Status: AC
Start: 2022-07-29 — End: ?

## 2022-07-29 MED ORDER — OLMESARTAN MEDOXOMIL-HCTZ 40-12.5 MG PO TABS: ORAL_TABLET | ORAL | 3 refills | Status: DC

## 2022-07-29 MED ORDER — SIMVASTATIN 20 MG PO TABS: 20.0000 mg | ORAL_TABLET | Freq: Every day | ORAL | 3 refills | Status: DC

## 2022-07-29 MED ORDER — EZETIMIBE 10 MG PO TABS: 10.0000 mg | ORAL_TABLET | Freq: Every day | ORAL | 3 refills | Status: DC

## 2022-07-29 MED ORDER — APIXABAN 5 MG PO TABS: 5.0000 mg | ORAL_TABLET | Freq: Two times a day (BID) | ORAL | 3 refills | Status: DC

## 2022-07-29 MED ORDER — FENOFIBRATE 48 MG PO TABS
ORAL_TABLET | ORAL | 3 refills | Status: DC
Start: 2022-07-29 — End: 2023-07-22

## 2022-07-29 NOTE — Progress Notes (Signed)
Primary Physician/Referring:  Ferd Hibbs, NP  [name], female    DOB: [birth year], 77 y.o.   MRN: [id number]  Chief Complaint  Patient presents with   Atrial Fibrillation   Hypertension   Cerebrovascular Accident        Hyperlipidemia   Follow-up    1 year   HPI: Catherine [name]  is a 77 y.o. female  female  with paroxysmal atrial fibrillation, hyperlipidemia, hypertension, history of prior stroke in 2018 with residual left sided weakness and smell and taste being altered but mostly resolved, stroke felt to be due to atherosclerotic disease and history of statin intolerance with inability to take greater than 20 mg of simvastatin + Zetia + fenofibrate 48 mg which she is tolerating.  She is presently doing well and remains asymptomatic.  Except for mild residual left-sided weakness no other specific complaints.  She continues to remain active.  Past Medical History:  Diagnosis Date   Arthritis    Cataract    GERD (gastroesophageal reflux disease)    Glaucoma    Hyperlipidemia    Hypertension    Stroke    TIA (transient ischemic attack)     Past Surgical History:  Procedure Laterality Date   EYE SURGERY     KNEE SURGERY Right    complete   TONSILLECTOMY      Social History   Tobacco Use   Smoking status: Never   Smokeless tobacco: Never  Substance Use Topics   Alcohol use: No   Widowed. Now lives with daughter.   Review of Systems  Cardiovascular:  Negative for chest pain, dyspnea on exertion and leg swelling.  Gastrointestinal:  Negative for melena.  Neurological:  Positive for focal weakness (left sided, minimal).   Objective      07/29/2022    8:57 AM 07/24/2021    9:45 AM 07/24/2021    9:08 AM  Vitals with BMI  Height 5\' 3"   5\' 3"   Weight 170 lbs 13 oz  166 lbs  BMI [id number]  [id number]  Systolic 123456 0000000 [id number]  Diastolic 70 75 55  Pulse 63  62    Physical Exam Neck:     Vascular: No carotid bruit or JVD.  Cardiovascular:     Rate  and Rhythm: Normal rate and regular rhythm.     Pulses: Intact distal pulses.     Heart sounds: Normal heart sounds. No murmur heard.    No gallop.  Pulmonary:     Effort: Pulmonary effort is normal.     Breath sounds: Normal breath sounds.  Abdominal:     General: Bowel sounds are normal.     Palpations: Abdomen is soft.  Musculoskeletal:        General: No swelling.    Radiology: No results found.  Laboratory examination:   Lab Results  Component Value Date   NA 141 07/29/2022   K 4.5 07/29/2022   CO2 25 07/29/2022   GLUCOSE 90 07/29/2022   BUN 20 07/29/2022   CREATININE 1.24 (H) 07/29/2022   CALCIUM 9.7 07/29/2022   EGFR 45 (L) 07/29/2022   GFRNONAA 78 03/25/2020       Latest Ref Rng & Units 07/29/2022   10:43 AM 06/25/2021    9:41 AM 03/25/2020   10:29 AM  CMP  Glucose 70 - 99 mg/dL 90  93  93   BUN 8 - 27 mg/dL 20  18  12    Creatinine 0.57 - 1.00 mg/dL  1.24  0.90  0.75   Sodium 134 - 144 mmol/L 141  141  141   Potassium 3.5 - 5.2 mmol/L 4.5  4.2  4.6   Chloride 96 - 106 mmol/L 103  101  101   CO2 20 - 29 mmol/L 25  27  26    Calcium 8.7 - 10.3 mg/dL 9.7  9.7  9.6   Total Protein 6.0 - 8.5 g/dL 6.5  6.7    Total Bilirubin 0.0 - 1.2 mg/dL 0.5  0.6    Alkaline Phos 44 - 121 IU/L 64  67    AST 0 - 40 IU/L 17  23    ALT 0 - 32 IU/L 15  17        Latest Ref Rng & Units 07/29/2022   10:43 AM 06/25/2021    9:41 AM 03/24/2021    9:52 AM  CBC  WBC 3.4 - 10.8 x10E3/uL 4.6  5.4  5.4   Hemoglobin 11.1 - 15.9 g/dL 11.9  13.3  13.5   Hematocrit 34.0 - 46.6 % 35.9  40.0  40.9   Platelets 150 - 450 x10E3/uL 300  286  273    Lab Results  Component Value Date   CHOL 163 07/29/2022   HDL 58 07/29/2022   LDLCALC 82 07/29/2022   TRIG 134 07/29/2022   CHOLHDL 4.2 07/20/2016    Lipoprotein (a) <75.0 nmol/L 07/29/2022 9.4   Lab Results  Component Value Date   HGBA1C 5.4 07/20/2016    Lab Results  Component Value Date   HGBA1C 5.4 07/20/2016   MPG 108 07/20/2016    Lab Results  Component Value Date   TSH 2.140 03/24/2021    Last vitamin D Lab Results  Component Value Date   VD25OH 41.6 07/29/2022     External labs:  Cardiac Studies:   Echocardiogram 11/17/2018: Normal LV systolic function with EF 69%. Left ventricle cavity is normal in size. Moderate concentric hypertrophy of the left ventricle. Normal global wall motion. Doppler evidence of grade I (impaired) diastolic dysfunction, normal LAP. Calculated EF 69%. Trileaflet aortic valve. Mild to moderate aortic regurgitation. Mild (Grade I) mitral regurgitation. Mild to moderate tricuspid regurgitation. Estimated pulmonary artery systolic pressure is 28 mmHg. Mild increase in severity of valvular regurgitation since last study in 2015.    [place] Stress Test 11/07/2018: The resting electrocardiogram demonstrated normal sinus rhythm and normal resting conduction.  Poor R wave progression. Stress EKG is non diagnostic for ischemia as it is a pharmacologic stress using Lexiscan. Perfusion imaging study demonstrates mild apical thinning but no evidence of ischemia or infarct.  LVEF is normal at 79%. Low risk study.  EKG:  EKG 07/29/2022: Normal sinus rhythm at the rate of 62 bpm, normal axis, incomplete right bundle branch block.  No evidence of ischemia, otherwise normal EKG.  No significant change from 06/12/2021.  Medications   Allergies  Allergen Reactions   Zocor [Simvastatin] Other (See Comments)    Myalgias Pt cannot tolerate more than 20mg    Crestor [Rosuvastatin Calcium] Other (See Comments)    Myalgias   Lipitor [Atorvastatin] Other (See Comments)    Myalgias   Lisinopril Cough   Statins Other (See Comments)    Myalgias      Current Outpatient Medications:    Ascorbic Acid (VITAMIN C GUMMIES PO), Take by mouth daily., Disp: , Rfl:    BLACK ELDERBERRY,BERRY-FLOWER, PO, Take by mouth. Juice, 1 table spoon daily, Disp: , Rfl:    Calcium Citrate (  CITRACAL PO), Take 2  tablets daily by mouth. , Disp: , Rfl:    Cholecalciferol 25 MCG (1000 UT) CHEW, Chew 1 tablet by mouth., Disp: , Rfl:    Cyanocobalamin (B-12 PO), Take 1 tablet by mouth daily., Disp: , Rfl:    latanoprost (XALATAN) 0.005 % ophthalmic solution, Place 1 drop into both eyes at bedtime., Disp: , Rfl:    milk thistle 175 MG tablet, Take 175 mg by mouth daily., Disp: , Rfl:    Multiple Vitamins-Minerals (PRESERVISION AREDS 2 PO), Take by mouth daily., Disp: , Rfl:    NON FORMULARY, Kyolic Garlic extract capsules; Take 2 capsules by mouth two times a day, Disp: , Rfl:    NON FORMULARY, Juice Plus Omega Oil Capsules: Take 2 capsules by mouth once a day, Disp: , Rfl:    Probiotic Product (PROBIOTIC-10 PO), Take by mouth., Disp: , Rfl:    apixaban (ELIQUIS) 5 MG TABS tablet, Take 1 tablet (5 mg total) by mouth 2 (two) times daily., Disp: 200 tablet, Rfl: 3   ezetimibe (ZETIA) 10 MG tablet, Take 1 tablet (10 mg total) by mouth daily., Disp: 90 tablet, Rfl: 3   fenofibrate (TRICOR) 48 MG tablet, TAKE 1 TABLET BY MOUTH DAILY AFTER SUPPER, Disp: 90 tablet, Rfl: 3   metoprolol succinate (TOPROL-XL) 50 MG 24 hr tablet, TAKE 1 TABLET BY MOUTH DAILY WITH A MEAL, Disp: 90 tablet, Rfl: 3   olmesartan-hydrochlorothiazide (BENICAR HCT) 40-12.5 MG tablet, TAKE 1 TABLET BY MOUTH EACH MORNING, Disp: 90 tablet, Rfl: 3   simvastatin (ZOCOR) 20 MG tablet, Take 1 tablet (20 mg total) by mouth at bedtime., Disp: 90 tablet, Rfl: 3   Assessment     ICD-10-CM   1. Paroxysmal atrial fibrillation  I48.0 EKG 12-Lead    CMP14+EGFR    CBC    apixaban (ELIQUIS) 5 MG TABS tablet    metoprolol succinate (TOPROL-XL) 50 MG 24 hr tablet    2. History of cerebrovascular accident with residual deficit  I69.30     3. Primary hypertension  I10 olmesartan-hydrochlorothiazide (BENICAR HCT) 40-12.5 MG tablet    4. Mixed hyperlipidemia  E78.2 Lipoprotein A (LPA)    Lipid Panel With LDL/HDL Ratio    ezetimibe (ZETIA) 10 MG tablet     fenofibrate (TRICOR) 48 MG tablet    simvastatin (ZOCOR) 20 MG tablet    5. Hypovitaminosis D  E55.9 VITAMIN D 25 Hydroxy (Vit-D Deficiency, Fractures)     CHA2DS2-VASc Score is 6.  Yearly risk of stroke: 9.8% (F, A, HTN, CVA).  Score of 1=0.6; 2=2.2; 3=3.2; 4=4.8; 5=7.2; 6=9.8; 7=>9.8) -(CHF; HTN; vasc disease DM,  Female = 1; Age <65 =0; 65-74 = 1,  >75 =2; stroke/embolism= 2).   Meds ordered this encounter  Medications   apixaban (ELIQUIS) 5 MG TABS tablet    Sig: Take 1 tablet (5 mg total) by mouth 2 (two) times daily.    Dispense:  200 tablet    Refill:  3   ezetimibe (ZETIA) 10 MG tablet    Sig: Take 1 tablet (10 mg total) by mouth daily.    Dispense:  90 tablet    Refill:  3   fenofibrate (TRICOR) 48 MG tablet    Sig: TAKE 1 TABLET BY MOUTH DAILY AFTER SUPPER    Dispense:  90 tablet    Refill:  3   metoprolol succinate (TOPROL-XL) 50 MG 24 hr tablet    Sig: TAKE 1 TABLET BY MOUTH DAILY WITH A MEAL  Dispense:  90 tablet    Refill:  3   olmesartan-hydrochlorothiazide (BENICAR HCT) 40-12.5 MG tablet    Sig: TAKE 1 TABLET BY MOUTH EACH MORNING    Dispense:  90 tablet    Refill:  3   simvastatin (ZOCOR) 20 MG tablet    Sig: Take 1 tablet (20 mg total) by mouth at bedtime.    Dispense:  90 tablet    Refill:  3   Medications Discontinued During This Encounter  Medication Reason   apixaban (ELIQUIS) 5 MG TABS tablet Reorder   simvastatin (ZOCOR) 20 MG tablet Reorder   olmesartan-hydrochlorothiazide (BENICAR HCT) 40-12.5 MG tablet Reorder   metoprolol succinate (TOPROL-XL) 50 MG 24 hr tablet Reorder   fenofibrate (TRICOR) 48 MG tablet Reorder   ezetimibe (ZETIA) 10 MG tablet Reorder       Recommendations:   Catherine [name]  is a  77 y.o. female  with paroxysmal atrial fibrillation, hyperlipidemia, hypertension, history of prior stroke in 2018 with residual left sided weakness and smell and taste being altered but mostly resolved, stroke felt to be due to  atherosclerotic disease and history of statin intolerance with inability to take greater than 20 mg of simvastatin + Zetia + fenofibrate 48 mg which she is tolerating.  1. Paroxysmal atrial fibrillation Patient is maintaining sinus rhythm, she remains asymptomatic.  In view of high CHA2DS2-VASc score, continue anticoagulation.  She needs routine labs, these were ordered today, I will forward a copy to her PCP. I reviewed prior years labs.  - EKG 12-Lead - CMP14+EGFR - CBC - apixaban (ELIQUIS) 5 MG TABS tablet; Take 1 tablet (5 mg total) by mouth 2 (two) times daily.  Dispense: 200 tablet; Refill: 3  2. History of cerebrovascular accident with residual deficit She has not had any recurrence of TIA-like symptoms.  Continue anticoagulation in view of high CHA2DS2-VASc risk score.  3. Primary hypertension Blood pressure under excellent control, tolerating olmesartan HCT, I refilled the prescription.  She will need continued surveillance of her renal function. - olmesartan-hydrochlorothiazide (BENICAR HCT) 40-12.5 MG tablet; TAKE 1 TABLET BY MOUTH EACH MORNING  Dispense: 90 tablet; Refill: 3  4. Mixed hyperlipidemia She does not want to be on any higher doses of statins as she is intolerant to statins.  She is presently on simvastatin 20 mg and Zetia, LDL is >70 but she has remained stable, she does not want to consider any PCSK9 inhibitors, will continue present management.  Will consider placing her in a clinical trial if she is willing.  - Lipoprotein A (LPA) - Lipid Panel With LDL/HDL Ratio - ezetimibe (ZETIA) 10 MG tablet; Take 1 tablet (10 mg total) by mouth daily.  Dispense: 90 tablet; Refill: 3 - fenofibrate (TRICOR) 48 MG tablet; TAKE 1 TABLET BY MOUTH DAILY AFTER SUPPER  Dispense: 90 tablet; Refill: 3 - simvastatin (ZOCOR) 20 MG tablet; Take 1 tablet (20 mg total) by mouth at bedtime.  Dispense: 90 tablet; Refill: 3  5. Hypovitaminosis D Patient has history of hypovitaminosis D, will  check vitamin D levels today.  She is fasting. - VITAMIN D 25 Hydroxy (Vit-D Deficiency, Fractures)   Addendum: Patient has developed acute renal insufficiency, she will need continued close monitoring of her renal function.  I will hold a copy to her PCP and also will advise the patient of abnormal serum creatinine.  Her only potential nephrotoxic agent is olmesartan HCT 40/12.5 mg in the morning.  If her creatinine remains stable, we could continue  the same otherwise may have to decrease the dose and follow-up.  She may need nephrology consultation as well if creatinine continues to increase.    Adrian Prows, MD, [facility] 07/30/2022, 10:01 AM Office: [phone] Pager: [phone]

## 2022-07-30 LAB — LIPID PANEL WITH LDL/HDL RATIO
Cholesterol, Total: 163 mg/dL (ref 100–199)
HDL: 58 mg/dL (ref >39)
LDL Chol Calc (NIH): 82 mg/dL (ref 0–99)
LDL/HDL Ratio: 1.4 ratio (ref 0.0–3.2)
Triglycerides: 134 mg/dL (ref 0–149)
VLDL Cholesterol Cal: 23 mg/dL (ref 5–40)

## 2022-07-30 LAB — CBC
Hematocrit: 35.9 % (ref 34.0–46.6)
Hemoglobin: 11.9 g/dL (ref 11.1–15.9)
MCH: 30.3 pg (ref 26.6–33.0)
MCHC: 33.1 g/dL (ref 31.5–35.7)
MCV: 91 fL (ref 79–97)
Platelets: 300 10*3/uL (ref 150–450)
RBC: 3.93 x10E6/uL (ref 3.77–5.28)
RDW: 12.8 % (ref 11.7–15.4)
WBC: 4.6 10*3/uL (ref 3.4–10.8)

## 2022-07-30 LAB — CMP14+EGFR
ALT: 15 IU/L (ref 0–32)
AST: 17 IU/L (ref 0–40)
Albumin/Globulin Ratio: 2 (ref 1.2–2.2)
Albumin: 4.3 g/dL (ref 3.8–4.8)
Alkaline Phosphatase: 64 IU/L (ref 44–121)
BUN/Creatinine Ratio: 16 (ref 12–28)
BUN: 20 mg/dL (ref 8–27)
Bilirubin Total: 0.5 mg/dL (ref 0.0–1.2)
CO2: 25 mmol/L (ref 20–29)
Calcium: 9.7 mg/dL (ref 8.7–10.3)
Chloride: 103 mmol/L (ref 96–106)
Creatinine, Ser: 1.24 mg/dL — ABNORMAL HIGH (ref 0.57–1.00)
Globulin, Total: 2.2 g/dL (ref 1.5–4.5)
Glucose: 90 mg/dL (ref 70–99)
Potassium: 4.5 mmol/L (ref 3.5–5.2)
Sodium: 141 mmol/L (ref 134–144)
Total Protein: 6.5 g/dL (ref 6.0–8.5)
eGFR: 45 mL/min/{1.73_m2} — ABNORMAL LOW (ref >59)

## 2022-07-30 LAB — LIPOPROTEIN A (LPA): Lipoprotein (a): 9.4 nmol/L (ref <75.0)

## 2022-07-30 LAB — VITAMIN D 25 HYDROXY (VIT D DEFICIENCY, FRACTURES): Vit D, 25-Hydroxy: 41.6 ng/mL (ref 30.0–100.0)

## 2022-07-30 NOTE — Progress Notes (Signed)
Please inform the patient that her kidney function has slightly reduced from her baseline, this is new to her.  She may need further workup, I have forwarded the results to her PCP as well, she will need repeat BMP in about a month also and if she continues to have elevated serum creatinine, she may need further workup and to follow-up with her PCP as well.  I request patient to follow-up with her PCP regarding this matter, if she has any difficulty to contact us back.

## 2022-08-05 NOTE — Progress Notes (Signed)
Patietn was informed about her lab results. Patient understood

## 2023-07-22 ENCOUNTER — Other Ambulatory Visit: Payer: Self-pay | Admitting: Cardiology

## 2023-07-22 DIAGNOSIS — I10 Essential (primary) hypertension: Secondary | ICD-10-CM

## 2023-07-22 DIAGNOSIS — E782 Mixed hyperlipidemia: Secondary | ICD-10-CM

## 2023-07-29 ENCOUNTER — Ambulatory Visit: Payer: BLUE CROSS/BLUE SHIELD | Admitting: Cardiology

## 2023-08-16 ENCOUNTER — Other Ambulatory Visit: Payer: Self-pay | Admitting: Cardiology

## 2023-08-16 DIAGNOSIS — I480 Paroxysmal atrial fibrillation: Secondary | ICD-10-CM

## 2023-08-16 NOTE — Telephone Encounter (Signed)
 Prescription refill request for Eliquis  received. Indication: PAF Last office visit: 07/29/22  Luwanna Sam MD  (appt 09/01/23) Scr: 0.9 on 03/02/23  Epic Age: 78 Weight: 77.5kg  Based on above findings Eliquis  5mg  twice daily is the appropriate dose.  Refill approved.

## 2023-08-17 ENCOUNTER — Other Ambulatory Visit: Payer: Self-pay | Admitting: Cardiology

## 2023-08-17 DIAGNOSIS — E782 Mixed hyperlipidemia: Secondary | ICD-10-CM

## 2023-08-24 ENCOUNTER — Other Ambulatory Visit: Payer: Self-pay | Admitting: Cardiology

## 2023-08-24 DIAGNOSIS — I10 Essential (primary) hypertension: Secondary | ICD-10-CM

## 2023-09-01 ENCOUNTER — Encounter: Payer: Self-pay | Admitting: Cardiology

## 2023-09-01 ENCOUNTER — Ambulatory Visit: Attending: Cardiology | Admitting: Cardiology

## 2023-09-01 VITALS — BP 118/76 | HR 66 | Resp 16 | Ht 63.0 in | Wt 169.4 lb

## 2023-09-01 DIAGNOSIS — I480 Paroxysmal atrial fibrillation: Secondary | ICD-10-CM | POA: Insufficient documentation

## 2023-09-01 DIAGNOSIS — K921 Melena: Secondary | ICD-10-CM | POA: Diagnosis present

## 2023-09-01 DIAGNOSIS — E782 Mixed hyperlipidemia: Secondary | ICD-10-CM | POA: Diagnosis present

## 2023-09-01 DIAGNOSIS — I10 Essential (primary) hypertension: Secondary | ICD-10-CM | POA: Insufficient documentation

## 2023-09-01 NOTE — Patient Instructions (Signed)
 Medication Instructions:  Your physician recommends that you continue on your current medications as directed. Please refer to the Current Medication list given to you today.  *If you need a refill on your cardiac medications before your next appointment, please call your pharmacy*  Lab Work: Stool Guiac (go to lab on 1st floor to get sample cup and cards) If you have labs (blood work) drawn today and your tests are completely normal, you will receive your results only by: MyChart Message (if you have MyChart) OR A paper copy in the mail If you have any lab test that is abnormal or we need to change your treatment, we will call you to review the results.  Follow-Up: At [facility], you and your health needs are our priority.  As part of our continuing mission to provide you with exceptional heart care, our providers are all part of one team.  This team includes your primary Cardiologist (physician) and Advanced Practice Providers or APPs (Physician Assistants and Nurse Practitioners) who all work together to provide you with the care you need, when you need it.  Your next appointment:   As needed  The format for your next appointment:   In Person  Provider:   Knox Perl, MD{  We recommend signing up for the patient portal called "MyChart".  Sign up information is provided on this After Visit Summary.  MyChart is used to connect with patients for Virtual Visits (Telemedicine).  Patients are able to view lab/test results, encounter notes, upcoming appointments, etc.  Non-urgent messages can be sent to your provider as well.   To learn more about what you can do with MyChart, go to [email or web address].

## 2023-09-01 NOTE — Progress Notes (Signed)
 Cardiology Office Note:  .   Date:  09/01/2023  ID:  Catherine [name], DOB Jun 22, 1945, MRN [id number] PCP: Catherine Berry, NP  [facility] Providers Cardiologist:  Catherine Perl, MD   History of Present Illness: .   Catherine [name] is a 78 y.o. female with paroxysmal atrial fibrillation, hyperlipidemia, hypertension, history of prior stroke in 2018 with residual left sided weakness and smell and taste being altered but mostly resolved, stroke felt to be due to atherosclerotic disease and history of statin intolerance with inability to take greater than 20 mg of simvastatin  + Zetia  + fenofibrate  48 mg which she is tolerating.   She has had a normal nuclear stress test in July 2020 and an echocardiogram revealing normal LVEF with mild diastolic dysfunction and mild to moderate aortic valve and tricuspid regurgitation with normal PA pressure.   Discussed the use of AI scribe software for clinical note transcription with the patient, who gave verbal consent to proceed.  History of Present Illness Catherine [name] is a 78 year old female with atrial fibrillation who presents for a follow-up visit.  Her last atrial fibrillation episode occurred on September 27, 2018, with chest heaviness and a heart rate of 180 bpm, she also had acute bronchitis, however due to high chads vascular score, she is on Eliquis  without bleeding issues. Metoprolol  succinate 50 mg daily and olmesartan  HCT 40/12.5 mg manage her blood pressure effectively. Her kidney function is normal. She takes fenofibrate , simvastatin  20 mg, and ezetimibe  10 mg daily for cholesterol, which she tolerates well.  She is concerned about the rising cost of Eliquis  but can afford it. She prefers to obtain medications from a local pharmacy in [place].  She also states that she has had dark stools for almost a year and thinks that this may be related to her diet and juicing with greens.  Labs       Latest Ref Rng & Units 07/29/2022   10:43 AM  06/25/2021    9:41 AM 03/25/2020   10:29 AM  BMP  Glucose 70 - 99 mg/dL 90  93  93   BUN 8 - 27 mg/dL 20  18  12    Creatinine 0.57 - 1.00 mg/dL 4.09  8.11  9.14   BUN/Creat Ratio 12 - 28 16  20  16    Sodium 134 - 144 mmol/L 141  141  141   Potassium 3.5 - 5.2 mmol/L 4.5  4.2  4.6   Chloride 96 - 106 mmol/L 103  101  101   CO2 20 - 29 mmol/L 25  27  26    Calcium  8.7 - 10.3 mg/dL 9.7  9.7  9.6       Latest Ref Rng & Units 07/29/2022   10:43 AM 06/25/2021    9:41 AM 03/24/2021    9:52 AM  CBC  WBC 3.4 - 10.8 x10E3/uL 4.6  5.4  5.4   Hemoglobin 11.1 - 15.9 g/dL 78.2  95.6  21.3   Hematocrit 34.0 - 46.6 % 35.9  40.0  40.9   Platelets 150 - 450 x10E3/uL 300  286  273    Lab Results  Component Value Date   HGBA1C 5.4 07/20/2016    Lab Results  Component Value Date   TSH 2.140 03/24/2021    External Labs:  Care everywhere PCP labs 03/02/2023:  Serum glucose 95 mg, BUN 17, creatinine 0.9, EGFR 65 mL, potassium 5.3.  Repeat potassium 4.3 on 04/05/2023.  Total cholesterol  164, triglycerides 133, HDL 58, LDL 83.  Hb 12.3/HCT 37.3, platelets 307.  TSH normal at 2.47.  Vitamin D49.9.  ROS   Review of Systems  Cardiovascular:  Negative for chest pain, dyspnea on exertion and leg swelling.  Gastrointestinal:  Positive for melena.    Physical Exam:   VS:  BP 118/76 (BP Location: Left Arm, Patient Position: Sitting, Cuff Size: Large)   Pulse 66   Resp 16   Ht 5\' 3"  (1.6 m)   Wt 169 lb 6.4 oz (76.8 kg)   SpO2 99%   BMI 30.01 kg/m    Wt Readings from Last 3 Encounters:  09/01/23 169 lb 6.4 oz (76.8 kg)  07/29/22 170 lb 12.8 oz (77.5 kg)  07/24/21 166 lb (75.3 kg)    Physical Exam Neck:     Vascular: No carotid bruit or JVD.  Cardiovascular:     Rate and Rhythm: Normal rate and regular rhythm.     Pulses: Intact distal pulses.     Heart sounds: Normal heart sounds. No murmur heard.    No gallop.  Pulmonary:     Effort: Pulmonary effort is normal.     Breath sounds:  Normal breath sounds.  Abdominal:     General: Bowel sounds are normal.     Palpations: Abdomen is soft.  Musculoskeletal:     Right lower leg: No edema.     Left lower leg: No edema.    Studies Reviewed: Catherine [name]     EKG:    EKG Interpretation Date/Time:  Wednesday Sep 01 2023 09:05:32 EDT Ventricular Rate:  66 PR Interval:  178 QRS Duration:  86 QT Interval:  422 QTC Calculation: 442 R Axis:   7  Text Interpretation: EKG 09/01/2023: Normal sinus rhythm at rate of 66 bpm, incomplete right bundle branch block.  Normal EKG. Confirmed by Catherine [name], Catherine [name] [phone]) on 09/01/2023 9:25:58 AM    EKG 09/27/2018: Atrial fibrillation with rapid ventricular response at the rate of 149 bpm, inferior infarct old, poor R-wave progression, low voltage complexes.  Pulmonary disease pattern.   Medications and allergies    Allergies  Allergen Reactions   Zocor  [Simvastatin ] Other (See Comments)    Myalgias Pt cannot tolerate more than 20mg    Crestor [Rosuvastatin Calcium ] Other (See Comments)    Myalgias   Lipitor [Atorvastatin ] Other (See Comments)    Myalgias   Lisinopril  Cough   Statins Other (See Comments)    Myalgias     Current Outpatient Medications:    apixaban  (ELIQUIS ) 5 MG TABS tablet, TAKE 1 TABLET BY MOUTH TWICE DAILY, Disp: 200 tablet, Rfl: 1   Ascorbic Acid (VITAMIN C GUMMIES PO), Take by mouth daily., Disp: , Rfl:    BLACK ELDERBERRY,[name]-FLOWER, PO, Take by mouth. Juice, 1 table spoon daily, Disp: , Rfl:    Calcium  Citrate (CITRACAL PO), Take 2 tablets daily by mouth. , Disp: , Rfl:    Cholecalciferol 25 MCG (1000 UT) CHEW, Chew 1 tablet by mouth., Disp: , Rfl:    Cyanocobalamin (B-12 PO), Take 1 tablet by mouth daily., Disp: , Rfl:    ezetimibe  (ZETIA ) 10 MG tablet, Take 1 tablet (10 mg total) by mouth daily., Disp: 90 tablet, Rfl: 3   fenofibrate  (TRICOR ) 48 MG tablet, TAKE 1 TABLET BY MOUTH DAILY AFTER SUPPER, Disp: 90 tablet, Rfl: 0   latanoprost  (XALATAN ) 0.005 %  ophthalmic solution, Place 1 drop into both eyes at bedtime., Disp: , Rfl:    metoprolol  succinate (TOPROL -XL) 50 MG  24 hr tablet, TAKE 1 TABLET BY MOUTH DAILY WITH A MEAL, Disp: 90 tablet, Rfl: 3   milk thistle 175 MG tablet, Take 175 mg by mouth daily., Disp: , Rfl:    Multiple Vitamins-Minerals (PRESERVISION AREDS 2 PO), Take by mouth daily., Disp: , Rfl:    NON FORMULARY, Kyolic Garlic extract capsules; Take 2 capsules by mouth two times a day, Disp: , Rfl:    NON FORMULARY, Juice Plus Omega Oil Capsules: Take 2 capsules by mouth once a day, Disp: , Rfl:    olmesartan -hydrochlorothiazide (BENICAR  HCT) 40-12.5 MG tablet, TAKE 1 TABLET BY MOUTH EVERY MORNING, Disp: 90 tablet, Rfl: 3   Probiotic Product (PROBIOTIC-10 PO), Take by mouth., Disp: , Rfl:    simvastatin  (ZOCOR ) 20 MG tablet, Take 1 tablet (20 mg total) by mouth at bedtime., Disp: 90 tablet, Rfl: 3   No orders of the defined types were placed in this encounter.    There are no discontinued medications.   ASSESSMENT AND PLAN: .      ICD-10-CM   1. Paroxysmal atrial fibrillation (HCC)  I48.0 EKG 12-Lead    2. Primary hypertension  I10     3. Mixed hyperlipidemia  E78.2     4. Melena  K92.1 Fecal occult blood, imunochemical    CANCELED: POCT Occult Blood Stool     CHA2DS2-VASc Score is 5. Yearly risk of stroke 6.7%.   Assessment & Plan Paroxysmal atrial fibrillation   She experienced a single episode of paroxysmal atrial fibrillation in June 2020, with symptoms of chest heaviness and palpitations. Her condition is currently well-managed on Eliquis , with no recent episodes. Continued anticoagulation is necessary due to her age, female sex, and stroke history. Addressed concerns about Eliquis  cost, explaining the annual out-of-pocket maximum for medications. Continue Eliquis  for anticoagulation.  Stroke   Her stroke history requires ongoing anticoagulation therapy with Eliquis  to prevent recurrence.  Hypertension    Hypertension is well-controlled with current medications, and her blood pressure remains within normal limits. Continue metoprolol  succinate 50 mg once daily and olmesartan /HCTZ 40/12.5 mg once daily.  Hyperlipidemia   Hyperlipidemia is well-managed with her current regimen, which she tolerates well after previous intolerance to other medications. Continue fenofibrate , simvastatin  20 mg once daily, and ezetimibe  10 mg once daily.  Melena   She reports melena for the past year, possibly related to diet. No iron supplementation is being taken, and a previous stool test was normal. The differential includes dietary causes versus gastrointestinal bleeding. Plan to perform a stool guaiac test and refer to gastroenterology if positive. Order stool guaiac test and provide stool cards for home testing.   As she is stable from cardiac standpoint and maintaining sinus rhythm, blood pressure is well-controlled, she has no active cardiac issues, I will see her back on a as needed basis.  She will continue with prescription refills from her PCP.  Signed,  Catherine Perl, MD, [facility] 09/01/2023, 10:18 AM [facility] [address] [place], [place] [address] Phone: [phone]. Fax:  [phone]

## 2023-10-12 ENCOUNTER — Other Ambulatory Visit: Payer: Self-pay | Admitting: Cardiology

## 2023-10-12 DIAGNOSIS — E782 Mixed hyperlipidemia: Secondary | ICD-10-CM

## 2023-10-12 DIAGNOSIS — I480 Paroxysmal atrial fibrillation: Secondary | ICD-10-CM

## 2024-01-14 ENCOUNTER — Other Ambulatory Visit: Payer: Self-pay | Admitting: Cardiology

## 2024-01-14 DIAGNOSIS — E782 Mixed hyperlipidemia: Secondary | ICD-10-CM

## 2024-02-14 ENCOUNTER — Other Ambulatory Visit: Payer: Self-pay | Admitting: Cardiology

## 2024-02-14 DIAGNOSIS — I480 Paroxysmal atrial fibrillation: Secondary | ICD-10-CM

## 2024-02-14 NOTE — Telephone Encounter (Signed)
 Prescription refill request for Eliquis  received. Indication: PAF Last office visit: 09/01/23  JINNY Bergamo MD Scr: 1.06 on 02/04/24  [facility] Age: 78 Weight: 76.8kg  Based on above findings Eliquis  5mg  twice daily is the appropriate dose.  Refill approved.
